# Patient Record
Sex: Male | Born: 1935
Health system: Southern US, Community
[De-identification: ages and names within clinical notes are randomized; demographics above are authoritative.]

## PROBLEM LIST (undated history)

## (undated) DIAGNOSIS — Z8719 Personal history of other diseases of the digestive system: Secondary | ICD-10-CM

## (undated) DIAGNOSIS — M359 Systemic involvement of connective tissue, unspecified: Secondary | ICD-10-CM

## (undated) DIAGNOSIS — R0600 Dyspnea, unspecified: Secondary | ICD-10-CM

## (undated) DIAGNOSIS — I6529 Occlusion and stenosis of unspecified carotid artery: Secondary | ICD-10-CM

## (undated) DIAGNOSIS — K219 Gastro-esophageal reflux disease without esophagitis: Secondary | ICD-10-CM

## (undated) DIAGNOSIS — F0390 Unspecified dementia without behavioral disturbance: Secondary | ICD-10-CM

## (undated) DIAGNOSIS — J449 Chronic obstructive pulmonary disease, unspecified: Secondary | ICD-10-CM

## (undated) DIAGNOSIS — C801 Malignant (primary) neoplasm, unspecified: Secondary | ICD-10-CM

## (undated) DIAGNOSIS — M539 Dorsopathy, unspecified: Secondary | ICD-10-CM

## (undated) DIAGNOSIS — H409 Unspecified glaucoma: Secondary | ICD-10-CM

## (undated) DIAGNOSIS — M1990 Unspecified osteoarthritis, unspecified site: Secondary | ICD-10-CM

## (undated) DIAGNOSIS — J45909 Unspecified asthma, uncomplicated: Secondary | ICD-10-CM

## (undated) DIAGNOSIS — I509 Heart failure, unspecified: Secondary | ICD-10-CM

## (undated) DIAGNOSIS — I10 Essential (primary) hypertension: Secondary | ICD-10-CM

## (undated) DIAGNOSIS — Z8619 Personal history of other infectious and parasitic diseases: Secondary | ICD-10-CM

## (undated) DIAGNOSIS — G629 Polyneuropathy, unspecified: Secondary | ICD-10-CM

## (undated) DIAGNOSIS — Z950 Presence of cardiac pacemaker: Secondary | ICD-10-CM

## (undated) DIAGNOSIS — I2510 Atherosclerotic heart disease of native coronary artery without angina pectoris: Secondary | ICD-10-CM

## (undated) HISTORY — PX: BACK SURGERY: SHX140

## (undated) HISTORY — PX: JOINT REPLACEMENT: SHX530

---

## 2004-11-03 DIAGNOSIS — Z950 Presence of cardiac pacemaker: Secondary | ICD-10-CM

## 2004-11-03 HISTORY — DX: Presence of cardiac pacemaker: Z95.0

## 2005-01-27 ENCOUNTER — Other Ambulatory Visit: Payer: Self-pay

## 2005-01-27 ENCOUNTER — Inpatient Hospital Stay: Payer: Self-pay | Admitting: Internal Medicine

## 2005-02-23 ENCOUNTER — Inpatient Hospital Stay: Payer: Self-pay | Admitting: Internal Medicine

## 2005-02-26 ENCOUNTER — Emergency Department: Payer: Self-pay | Admitting: Emergency Medicine

## 2005-05-31 ENCOUNTER — Other Ambulatory Visit: Payer: Self-pay

## 2005-05-31 ENCOUNTER — Emergency Department: Payer: Self-pay | Admitting: Emergency Medicine

## 2005-06-30 ENCOUNTER — Ambulatory Visit: Payer: Self-pay | Admitting: Internal Medicine

## 2005-07-13 ENCOUNTER — Other Ambulatory Visit: Payer: Self-pay

## 2005-07-13 ENCOUNTER — Emergency Department: Payer: Self-pay | Admitting: Internal Medicine

## 2005-07-14 ENCOUNTER — Ambulatory Visit: Payer: Self-pay | Admitting: Internal Medicine

## 2005-08-20 ENCOUNTER — Ambulatory Visit: Payer: Self-pay

## 2005-09-22 ENCOUNTER — Ambulatory Visit: Payer: Self-pay | Admitting: Gastroenterology

## 2006-05-10 ENCOUNTER — Emergency Department: Payer: Self-pay | Admitting: Emergency Medicine

## 2006-05-10 ENCOUNTER — Other Ambulatory Visit: Payer: Self-pay

## 2006-06-13 HISTORY — PX: SKIN DEBRIDEMENT: SHX5235

## 2007-07-12 ENCOUNTER — Ambulatory Visit: Payer: Self-pay | Admitting: Physician Assistant

## 2007-11-30 ENCOUNTER — Emergency Department: Payer: Self-pay | Admitting: Emergency Medicine

## 2007-11-30 ENCOUNTER — Other Ambulatory Visit: Payer: Self-pay

## 2008-03-16 ENCOUNTER — Ambulatory Visit: Payer: Self-pay | Admitting: Internal Medicine

## 2008-11-14 ENCOUNTER — Ambulatory Visit: Payer: Self-pay | Admitting: Internal Medicine

## 2009-05-17 ENCOUNTER — Ambulatory Visit: Payer: Self-pay | Admitting: Internal Medicine

## 2009-05-19 ENCOUNTER — Emergency Department: Payer: Self-pay | Admitting: Emergency Medicine

## 2009-11-23 ENCOUNTER — Emergency Department: Payer: Self-pay | Admitting: Emergency Medicine

## 2009-11-28 ENCOUNTER — Ambulatory Visit: Payer: Self-pay | Admitting: Cardiology

## 2009-11-29 ENCOUNTER — Ambulatory Visit: Payer: Self-pay | Admitting: Cardiology

## 2010-02-14 ENCOUNTER — Ambulatory Visit: Payer: Self-pay | Admitting: Otolaryngology

## 2010-02-16 ENCOUNTER — Emergency Department: Payer: Self-pay | Admitting: Emergency Medicine

## 2010-07-25 ENCOUNTER — Ambulatory Visit: Payer: Self-pay | Admitting: Otolaryngology

## 2010-07-29 ENCOUNTER — Ambulatory Visit: Payer: Self-pay | Admitting: Internal Medicine

## 2010-08-09 ENCOUNTER — Ambulatory Visit: Payer: Self-pay | Admitting: Unknown Physician Specialty

## 2010-08-13 ENCOUNTER — Ambulatory Visit: Payer: Self-pay | Admitting: Pain Medicine

## 2010-08-15 ENCOUNTER — Ambulatory Visit: Payer: Self-pay | Admitting: Pain Medicine

## 2010-08-29 ENCOUNTER — Ambulatory Visit: Payer: Self-pay | Admitting: Internal Medicine

## 2010-09-04 ENCOUNTER — Ambulatory Visit: Payer: Self-pay | Admitting: Pain Medicine

## 2010-10-12 ENCOUNTER — Observation Stay: Payer: Self-pay | Admitting: Internal Medicine

## 2010-11-03 DIAGNOSIS — Z8619 Personal history of other infectious and parasitic diseases: Secondary | ICD-10-CM

## 2010-11-03 HISTORY — DX: Personal history of other infectious and parasitic diseases: Z86.19

## 2011-11-04 ENCOUNTER — Emergency Department: Payer: Self-pay | Admitting: Emergency Medicine

## 2011-11-04 DIAGNOSIS — C801 Malignant (primary) neoplasm, unspecified: Secondary | ICD-10-CM

## 2011-11-04 HISTORY — DX: Malignant (primary) neoplasm, unspecified: C80.1

## 2011-11-04 LAB — COMPREHENSIVE METABOLIC PANEL
Albumin: 3.8 g/dL (ref 3.4–5.0)
Alkaline Phosphatase: 62 U/L (ref 50–136)
BUN: 12 mg/dL (ref 7–18)
Bilirubin,Total: 0.7 mg/dL (ref 0.2–1.0)
Calcium, Total: 8.7 mg/dL (ref 8.5–10.1)
Glucose: 110 mg/dL — ABNORMAL HIGH (ref 65–99)
Osmolality: 282 (ref 275–301)
SGPT (ALT): 22 U/L
Sodium: 141 mmol/L (ref 136–145)

## 2011-11-04 LAB — CBC
HCT: 36.7 % — ABNORMAL LOW (ref 40.0–52.0)
HGB: 12.7 g/dL — ABNORMAL LOW (ref 13.0–18.0)
MCHC: 34.5 g/dL (ref 32.0–36.0)
MCV: 95 fL (ref 80–100)
RBC: 3.86 10*6/uL — ABNORMAL LOW (ref 4.40–5.90)
RDW: 13.1 % (ref 11.5–14.5)

## 2013-05-02 ENCOUNTER — Ambulatory Visit: Payer: Self-pay | Admitting: Internal Medicine

## 2013-12-21 ENCOUNTER — Ambulatory Visit: Payer: Self-pay | Admitting: Internal Medicine

## 2014-01-12 ENCOUNTER — Ambulatory Visit: Payer: Self-pay | Admitting: Cardiology

## 2014-01-12 LAB — CK-MB: CK-MB: 1.6 ng/mL (ref 0.5–3.6)

## 2014-01-13 LAB — BASIC METABOLIC PANEL
Anion Gap: 3 — ABNORMAL LOW (ref 7–16)
BUN: 10 mg/dL (ref 7–18)
CHLORIDE: 109 mmol/L — AB (ref 98–107)
CO2: 28 mmol/L (ref 21–32)
Calcium, Total: 8.1 mg/dL — ABNORMAL LOW (ref 8.5–10.1)
Creatinine: 0.82 mg/dL (ref 0.60–1.30)
EGFR (Non-African Amer.): >60
GLUCOSE: 98 mg/dL (ref 65–99)
Osmolality: 278 (ref 275–301)
POTASSIUM: 3.8 mmol/L (ref 3.5–5.1)
Sodium: 140 mmol/L (ref 136–145)

## 2014-02-28 ENCOUNTER — Ambulatory Visit: Payer: Self-pay | Admitting: Internal Medicine

## 2014-03-16 ENCOUNTER — Observation Stay: Payer: Self-pay | Admitting: Internal Medicine

## 2014-03-16 LAB — BASIC METABOLIC PANEL
Anion Gap: 8 (ref 7–16)
BUN: 9 mg/dL (ref 7–18)
CALCIUM: 8.6 mg/dL (ref 8.5–10.1)
CHLORIDE: 103 mmol/L (ref 98–107)
Co2: 28 mmol/L (ref 21–32)
Creatinine: 0.97 mg/dL (ref 0.60–1.30)
Glucose: 109 mg/dL — ABNORMAL HIGH (ref 65–99)
Osmolality: 277 (ref 275–301)
Potassium: 3.7 mmol/L (ref 3.5–5.1)
SODIUM: 139 mmol/L (ref 136–145)

## 2014-03-16 LAB — CBC
HCT: 39.2 % — ABNORMAL LOW (ref 40.0–52.0)
HGB: 13.6 g/dL (ref 13.0–18.0)
MCH: 32.9 pg (ref 26.0–34.0)
MCHC: 34.6 g/dL (ref 32.0–36.0)
MCV: 95 fL (ref 80–100)
Platelet: 156 10*3/uL (ref 150–440)
RBC: 4.12 10*6/uL — ABNORMAL LOW (ref 4.40–5.90)
RDW: 13.7 % (ref 11.5–14.5)
WBC: 5.3 10*3/uL (ref 3.8–10.6)

## 2014-03-16 LAB — CK-MB
CK-MB: 2.8 ng/mL (ref 0.5–3.6)
CK-MB: 2.5 ng/mL (ref 0.5–3.6)
CK-MB: 2.7 ng/mL (ref 0.5–3.6)

## 2014-03-16 LAB — LIPID PANEL
Cholesterol: 180 mg/dL (ref 0–200)
HDL: 59 mg/dL (ref 40–60)
Ldl Cholesterol, Calc: 87 mg/dL (ref 0–100)
Triglycerides: 169 mg/dL (ref 0–200)
VLDL Cholesterol, Calc: 34 mg/dL (ref 5–40)

## 2014-03-16 LAB — PROTIME-INR
INR: 1
PROTHROMBIN TIME: 13 s (ref 11.5–14.7)

## 2014-03-16 LAB — TROPONIN I
Troponin-I: <0.02 ng/mL
Troponin-I: <0.02 ng/mL
Troponin-I: <0.02 ng/mL

## 2014-03-16 LAB — PRO B NATRIURETIC PEPTIDE: B-TYPE NATIURETIC PEPTID: 772 pg/mL — AB (ref 0–450)

## 2014-03-17 LAB — BASIC METABOLIC PANEL
Anion Gap: 4 — ABNORMAL LOW (ref 7–16)
BUN: 10 mg/dL (ref 7–18)
CO2: 29 mmol/L (ref 21–32)
Calcium, Total: 8.3 mg/dL — ABNORMAL LOW (ref 8.5–10.1)
Chloride: 109 mmol/L — ABNORMAL HIGH (ref 98–107)
Creatinine: 0.9 mg/dL (ref 0.60–1.30)
EGFR (Non-African Amer.): >60
Glucose: 101 mg/dL — ABNORMAL HIGH (ref 65–99)
OSMOLALITY: 282 (ref 275–301)
POTASSIUM: 3.8 mmol/L (ref 3.5–5.1)
Sodium: 142 mmol/L (ref 136–145)

## 2014-03-17 LAB — CBC WITH DIFFERENTIAL/PLATELET
BASOS ABS: 0.1 10*3/uL (ref 0.0–0.1)
Basophil %: 1.2 %
Eosinophil #: 0.2 10*3/uL (ref 0.0–0.7)
Eosinophil %: 3.2 %
HCT: 38.5 % — ABNORMAL LOW (ref 40.0–52.0)
HGB: 13.4 g/dL (ref 13.0–18.0)
Lymphocyte #: 0.9 10*3/uL — ABNORMAL LOW (ref 1.0–3.6)
Lymphocyte %: 18.3 %
MCH: 33 pg (ref 26.0–34.0)
MCHC: 34.7 g/dL (ref 32.0–36.0)
MCV: 95 fL (ref 80–100)
MONO ABS: 0.4 x10 3/mm (ref 0.2–1.0)
Monocyte %: 8.7 %
NEUTROS PCT: 68.6 %
Neutrophil #: 3.3 10*3/uL (ref 1.4–6.5)
Platelet: 153 10*3/uL (ref 150–440)
RBC: 4.05 10*6/uL — ABNORMAL LOW (ref 4.40–5.90)
RDW: 13.8 % (ref 11.5–14.5)
WBC: 4.9 10*3/uL (ref 3.8–10.6)

## 2014-03-17 LAB — MAGNESIUM: Magnesium: 2.5 mg/dL — ABNORMAL HIGH

## 2014-11-07 DIAGNOSIS — H5310 Unspecified subjective visual disturbances: Secondary | ICD-10-CM | POA: Diagnosis not present

## 2014-11-08 DIAGNOSIS — I10 Essential (primary) hypertension: Secondary | ICD-10-CM | POA: Diagnosis not present

## 2014-11-08 DIAGNOSIS — I25119 Atherosclerotic heart disease of native coronary artery with unspecified angina pectoris: Secondary | ICD-10-CM | POA: Diagnosis not present

## 2014-11-08 DIAGNOSIS — E782 Mixed hyperlipidemia: Secondary | ICD-10-CM | POA: Diagnosis not present

## 2014-11-08 DIAGNOSIS — I442 Atrioventricular block, complete: Secondary | ICD-10-CM | POA: Diagnosis not present

## 2014-11-09 ENCOUNTER — Ambulatory Visit: Payer: Self-pay | Admitting: Internal Medicine

## 2014-11-09 DIAGNOSIS — H539 Unspecified visual disturbance: Secondary | ICD-10-CM | POA: Diagnosis not present

## 2014-11-16 DIAGNOSIS — I25119 Atherosclerotic heart disease of native coronary artery with unspecified angina pectoris: Secondary | ICD-10-CM | POA: Diagnosis not present

## 2014-11-16 DIAGNOSIS — I10 Essential (primary) hypertension: Secondary | ICD-10-CM | POA: Diagnosis not present

## 2014-11-16 DIAGNOSIS — R109 Unspecified abdominal pain: Secondary | ICD-10-CM | POA: Diagnosis not present

## 2014-11-16 DIAGNOSIS — E780 Pure hypercholesterolemia: Secondary | ICD-10-CM | POA: Diagnosis not present

## 2014-12-04 DIAGNOSIS — R350 Frequency of micturition: Secondary | ICD-10-CM | POA: Diagnosis not present

## 2014-12-04 DIAGNOSIS — Z0001 Encounter for general adult medical examination with abnormal findings: Secondary | ICD-10-CM | POA: Diagnosis not present

## 2014-12-05 DIAGNOSIS — H53123 Transient visual loss, bilateral: Secondary | ICD-10-CM | POA: Diagnosis not present

## 2014-12-08 DIAGNOSIS — I10 Essential (primary) hypertension: Secondary | ICD-10-CM | POA: Diagnosis not present

## 2014-12-08 DIAGNOSIS — G453 Amaurosis fugax: Secondary | ICD-10-CM | POA: Diagnosis not present

## 2014-12-08 DIAGNOSIS — I442 Atrioventricular block, complete: Secondary | ICD-10-CM | POA: Diagnosis not present

## 2014-12-08 DIAGNOSIS — I25119 Atherosclerotic heart disease of native coronary artery with unspecified angina pectoris: Secondary | ICD-10-CM | POA: Diagnosis not present

## 2014-12-19 DIAGNOSIS — I6523 Occlusion and stenosis of bilateral carotid arteries: Secondary | ICD-10-CM | POA: Diagnosis not present

## 2014-12-19 DIAGNOSIS — E782 Mixed hyperlipidemia: Secondary | ICD-10-CM | POA: Diagnosis not present

## 2014-12-19 DIAGNOSIS — G453 Amaurosis fugax: Secondary | ICD-10-CM | POA: Diagnosis not present

## 2014-12-19 DIAGNOSIS — I2510 Atherosclerotic heart disease of native coronary artery without angina pectoris: Secondary | ICD-10-CM | POA: Diagnosis not present

## 2014-12-19 DIAGNOSIS — I10 Essential (primary) hypertension: Secondary | ICD-10-CM | POA: Diagnosis not present

## 2014-12-26 DIAGNOSIS — I2510 Atherosclerotic heart disease of native coronary artery without angina pectoris: Secondary | ICD-10-CM | POA: Diagnosis not present

## 2014-12-26 DIAGNOSIS — I499 Cardiac arrhythmia, unspecified: Secondary | ICD-10-CM | POA: Diagnosis not present

## 2014-12-26 DIAGNOSIS — I6529 Occlusion and stenosis of unspecified carotid artery: Secondary | ICD-10-CM | POA: Diagnosis not present

## 2014-12-26 DIAGNOSIS — I10 Essential (primary) hypertension: Secondary | ICD-10-CM | POA: Diagnosis not present

## 2015-01-16 DIAGNOSIS — R0602 Shortness of breath: Secondary | ICD-10-CM | POA: Diagnosis not present

## 2015-01-16 DIAGNOSIS — I2510 Atherosclerotic heart disease of native coronary artery without angina pectoris: Secondary | ICD-10-CM | POA: Diagnosis not present

## 2015-01-16 DIAGNOSIS — I442 Atrioventricular block, complete: Secondary | ICD-10-CM | POA: Diagnosis not present

## 2015-01-16 DIAGNOSIS — I10 Essential (primary) hypertension: Secondary | ICD-10-CM | POA: Diagnosis not present

## 2015-01-25 DIAGNOSIS — J4530 Mild persistent asthma, uncomplicated: Secondary | ICD-10-CM | POA: Diagnosis not present

## 2015-01-25 DIAGNOSIS — E6609 Other obesity due to excess calories: Secondary | ICD-10-CM | POA: Diagnosis not present

## 2015-01-25 DIAGNOSIS — R0602 Shortness of breath: Secondary | ICD-10-CM | POA: Diagnosis not present

## 2015-02-24 NOTE — Discharge Summary (Signed)
PATIENT NAME:  Darrell Jacobson, Darrell Jacobson MR#:  [id number] DATE OF BIRTH:  Nov 11, 1927  DATE OF ADMISSION:  01/12/2014 DATE OF DISCHARGE:  01/13/2014  PRIMARY CARE PHYSICIAN: Dr. Gilford Rile.   FINAL DIAGNOSES: 1.  Coronary artery disease.  2.  Hypertension.  3.  Hyperlipidemia.  4. Complete heart block, status post dual-chamber pacemaker.   DISCHARGE MEDICATIONS: Aspirin 81 mg daily, clopidogrel 75 mg daily, Bystolic 5 mg daily, chlorthalidone 25 mg daily, Tylenol Extra Strength 500 mg 2 tabs daily p.r.n., Singulair 10 mg daily p.r.n., doxazosin 8 mg at bedtime, omeprazole 20 mg daily p.r.n., lorazepam 0.5 mg 2 to 4 times daily p.r.n., latanoprost ophthalmic solution 1 drop each eye at bedtime, ProAir 2 puffs q.4 hours.   PROCEDURES: Percutaneous coronary intervention on 01/12/2014.   HISTORY OF PRESENT ILLNESS: Please see admission H and [place] COURSE: The patient underwent elective percutaneous coronary intervention. The patient received a 2.5 x 18 mm drug-eluting Xience EX stent in the first obtuse marginal branch. There was an excellent angiographic result. The patient has a known 60% to 70% stenosis in the mid left anterior descending coronary artery. Fractional flow reserve was performed, which was 0.89, deemed hemodynamically insignificant. The patient had an uncomplicated hospital course. On the morning of 01/13/2014, the patient was discharged home in stable condition.    ____________________________ Isaias Cowman, MD ap:dmm D: 01/13/2014 07:54:57 ET T: 01/13/2014 11:51:49 ET JOB#: [id number]  cc: Isaias Cowman, MD, <Dictator> Isaias Cowman MD ELECTRONICALLY SIGNED 01/24/2014 12:45

## 2015-02-24 NOTE — H&P (Signed)
PATIENT NAME:  Darrell Jacobson, Darrell Jacobson MR#:  [id number] DATE OF BIRTH:  17-Jan-1928  DATE OF ADMISSION:  03/16/2014  REFERRING PHYSICIAN: Gretchen Short. Beather Arbour, MD  PRIMARY CARE PHYSICIAN: John B. Sarina Ser, MD  PRIMARY CARDIOLOGIST: Corey Skains, MD  CHIEF COMPLAINT: Chest pain.   HISTORY OF PRESENT ILLNESS: This is an 81 year old male with known history of coronary artery disease, status post recent cardiac catheterization by Dr. Saralyn Pilar, where he was found to have 60% to 70% stenosis in the mid left anterior descending coronary artery, status post drug-eluting stent in March of this year, as well as history of hypertension, hyperlipidemia and complete heart block, status post dual-chamber pacemaker. The patient reports since his surgery he does not have any complaints of chest pain. Reports yesterday evening, he did develop chest pain, midsternal, pressure-like quality, reports it resembled the chest pain when he had his cardiac catheterization done, radiating to the left chest, accompanied by shortness of breath, dizziness and sweating. By the time the patient came to the ED, he reports his chest pain improved. Refused to take nitroglycerin as it gives him headache. He received Lovenox and aspirin and reports currently his chest pain has resolved. The patient's EKG showing paced rhythm. As well, his first troponin was negative. Hospitalists requested to admit the patient for further evaluation.   PAST MEDICAL HISTORY:  1. Hypertension.  2. Hyperlipidemia.  3. Osteoarthritis.  4. Hiatal hernia.  5. History of disk disease.  6. Carotid artery stenosis.  7. Obesity.  8. Heart block.  9. Glaucoma.  10. COPD.  11. Low back pain.   PAST SURGICAL HISTORY:  1. Laminectomy x3.  2. Double hernia repair.  3. Left knee replacement.  4. Cardiac pacemaker in 2006 for complete heart block.   HOME MEDICATIONS:  1. Aspirin 81 mg daily.  2. Plavix 75 mg daily.  3. Bystolic 5 mg daily.  4. Chlorthalidone  25 mg daily.  5. Tylenol Extra as needed.  6. Singulair 10 mg daily p.r.n.  7. Doxazosin 8 mg at bedtime.  8. Omeprazole 20 mg p.r.n.  9. Lorazepam 0.5 mg 2 to 4 times a day.  10. Latanoprost ophthalmic solution 1 drop in each eye at bedtime.  11. ProAir 2 puffs every 4 hours as needed.   ALLERGIES: THE PATIENT HAS MULTIPLE DRUG ALLERGIES, INCLUDING AMBIEN, AMLODIPINE, AZOPT, BENICAR, BRIMONIDINE, BUSPIRONE, CELEBREX, CIPRO, CODEINE, COZAAR AND CYMBALTA.   SOCIAL HISTORY: Lives at home. Quit smoking in 1986. No drug use. Drinks alcohol occasionally.   FAMILY HISTORY: Significant for diabetes. Brother had throat cancer.   REVIEW OF SYSTEMS:  CONSTITUTIONAL: Denies fever, chills, fatigue, weakness.  EYES: Denies blurry vision, double vision, inflammation.  ENT: Denies tinnitus, ear pain, hearing loss, epistaxis.  RESPIRATORY: Denies cough, wheezing, hemoptysis. Reports shortness of breath.  CARDIOVASCULAR: Reports chest pain. Denies any edema, palpitations, syncope.  GASTROINTESTINAL: Denies nausea, vomiting, diarrhea, abdominal pain, hematemesis.  GENITOURINARY: Denies dysuria, hematuria, renal colic.  ENDOCRINE: Denies polyuria or polydipsia, heat or cold intolerance.  HEMATOLOGY: Denies anemia, easy bruising or bleeding diathesis. INTEGUMENTARY: Denies acne, rash or skin lesion.  MUSCULOSKELETAL: Denies any swelling, gout, cramps, arthritis.  NEUROLOGIC: Denies CVA, TIA, vertigo, tremor.  PSYCHIATRIC: Denies anxiety, insomnia or depression.   PHYSICAL EXAMINATION:  VITAL SIGNS: Temperature 97.6, pulse 69, respiratory rate 18, blood pressure 185/84, saturating 98% on oxygen.  GENERAL: Well-nourished male who looks comfortable in bed, in no apparent distress.  HEENT: Head atraumatic, normocephalic. Pupils equally reactive to light. Pink conjunctivae.  Anicteric sclerae. Moist oral mucosa.  NECK: Supple. No thyromegaly. No JVD.  CHEST: Good air entry bilaterally. No wheezing, rales,  rhonchi.  CARDIOVASCULAR: S1, S2 heard. No rubs, murmurs or gallops.  ABDOMEN: Soft, nontender, nondistended. Bowel sounds present.  EXTREMITIES: No edema. No clubbing. No cyanosis.  PSYCHIATRIC: Appropriate affect. Awake, alert x3. Intact judgment and insight.  NEUROLOGIC: Cranial nerves grossly intact. Motor 5 out of 5. No focal deficits.  SKIN: Warm and dry. Normal skin turgor.  MUSCULOSKELETAL: No joint effusion or erythema.   PERTINENT LABORATORY DATA: Glucose 109. BNP 772. BUN 9, creatinine 0.97, sodium 139, potassium 3.7, chloride 103. Troponin less than 0.02. White blood cells 5.3, hemoglobin 13.6, hematocrit 39.2, platelets 156. INR is 1. EKG showing paced rhythm at 67 beats per minute.   ASSESSMENT AND PLAN:  1. Chest pain. Given the patient's significant history of coronary artery disease, he will be admitted for further workup. His EKG showing paced rhythm. First troponin is negative. Currently, chest pain-free. The patient was given aspirin and Lovenox in ED out of concern of acute coronary syndrome. The patient will be admitted to telemetry. Will continue to cycle his cardiac enzymes, follow the trend, and will consult [facility] to see if there is any further workup that is indicated at this point.  2. History of coronary artery disease. The patient will be followed by cardiology. Will continue him on aspirin, Plavix, Bystolic, and he is refusing to take any nitroglycerin currently.  3. Hypertension. Blood pressure is mildly elevated. Will resume him back on his home medication. Will add p.r.n. hydralazine.  4. History of complete heart block, status post pacemaker. Will monitor him on telemetry.  5. Hyperlipidemia. The patient does not appear to be on any statins. Will check lipid panel, and if needed, will start them.  6. Deep vein thrombosis prophylaxis. Subcutaneous heparin.  CODE STATUS: Full code.   TOTAL TIME SPENT ON ADMISSION AND PATIENT CARE: 50 minutes.    ____________________________ Albertine Patricia, MD dse:lb D: 03/16/2014 06:54:29 ET T: 03/16/2014 07:33:32 ET JOB#: [id number]  cc: Albertine Patricia, MD, <Dictator> Leilan Bochenek Graciela Husbands MD ELECTRONICALLY SIGNED 03/17/2014 2:18

## 2015-02-24 NOTE — Discharge Summary (Signed)
Dates of Admission and Diagnosis:  Date of Admission 16-Mar-2014   Date of Discharge 15-Jan-2014   Admitting Diagnosis Chest pain and shortness of breath   Final Diagnosis Chest pain, MI ruled out, CAD s/p stent in 01/2014, COPD   Discharge Diagnosis 1 Chest pain, MI ruled out, CAD s/p stent in 01/2014, COPD    Chief Complaint/History of Present Illness Elderly male presented to ED with acute SOB and left sided chest pain. h/o CAD, s/p stent in 01/2014 on Plavix. CXR was negative. Labs in ED were remarkable for elevated BNP over 700.   Allergies:  Morphine: Anaphylaxis, Headaches  Vioxx: Resp. Distress, Swelling, Other  Lisinopril: Resp. Distress  Metoprolol: Resp. Distress  Cozaar: Resp. Distress, Anxiety, Dizzy/Fainting  Benicar: Resp. Distress, Anxiety, Swelling  Other- Explain in Comments Line: SOB  Lasix: SOB  Indomethacin: Agitation, Anxiety  Brimonidine: Dizzy/Fainting  Codeine: Anxiety  Meclizine: N/V/Diarrhea  Ambien: Hallucinations  Prednisone: Agitation, Anxiety  Felodipine: Anxiety, Dizzy/Fainting, Swelling  Xalatan: Dizzy/Fainting  Penicillin: Swelling  Sulfamethoxazole/Trimethoprim: N/V/Diarrhea  Mobic: Other  Celebrex: Other  Dicyclomine: Unknown  Cipro: Unknown  Skelaxin: Unknown  Omnicef: Unknown  Cymbalta: Unknown  Azopt: Unknown  Darvocet - N: Unknown  Relafen: Unknown  Novocain: Unknown  Edecrin: Unknown  Buspirone: Unknown  Hydrochlorothiazide: Unknown  Amlodipine: Resp. Distress  Hydrocodone: N/V/Diarrhea, SOB  Naproxen: N/V/Diarrhea  Tramadol: N/V/Diarrhea  Diazepam: Other  Pertinent Past History:  Pertinent Past History CAD, s/p stent in 01/2014 COPD   Hospital Course:  Hospital Course Patient was admitted to telemetry. He received Lovenox. Bystolic, Plavix and Aspirin were continued. Patient c/o headache with nitrates. Serial cardiac enzymes were negative. He received Duoneb treatments for COPD. Patient was evaluated by Cardiology  and started on Ranexa. Unable to tolerate Nitrates. SOB and chest pain resolved.  Exam: Alert, oriented, NAD. Chest: clear. CVS: RRR, no tachycardia. Abdomen: soft, non-tender. Ext: no edema. Neuro: nonfocal.  MEDICATION CLARIFICATION: continue Doxazosin or Cardura . Start Ranexa 500 mg BID. (Unable to tolerate nitrates)   Condition on Discharge Stable   DISCHARGE INSTRUCTIONS HOME MEDS:  Medication Reconciliation: Patient's Home Medications at Discharge:     Medication Instructions  omeprazole 20 mg oral delayed release capsule  1 cap(s) PO once a day PRN   lorazepam 0.5 mg oral tablet  1/2 tab 3-4 times daily   latanoprost ophthalmic 0.005% ophthalmic solution  1 drop(s) to each affected eye once a day (at bedtime)   proair hfa cfc free 90 mcg/inh inhalation aerosol  2 puff(s) inhaled every 4 hours if need   aspirin enteric coated 81 mg oral delayed release tablet  1 tab(s) orally once a day   bystolic 5 mg oral tablet  0.5 tab(s) orally once a day and 1/2 tab  if needed   singulair 10 mg oral tablet  1 tab(s) orally once a day (in the evening), As Needed   vitamin d3 2000 intl units oral tablet  1 tab(s) orally once a day   multivitamin  1 tab(s) orally once a day   clopidogrel 75 mg oral tablet  1 tab(s) orally once a day (at bedtime)   ranolazine 500 mg oral tablet, extended release  1 tab(s) orally 2 times a day   chlorthalidone  12.5 milligram(s) orally once a day    PRESCRIPTIONS: ELECTRONICALLY SUBMITTED   Physician's Instructions:  Diet Low Sodium  Low Fat, Low Cholesterol   Activity Limitations As tolerated   Return to Work Not Applicable   Time frame for Follow  Up Appointment 1-2 weeks  [facility] cardiology   Time frame for Follow Up Appointment 1-2 weeks  Dr. Gilford Rile   Other Comments MEDICATION CLARIFICATION: continue Doxazosin or Cardura .   Start Ranexa 500 mg BID. (Unable to tolerate nitrates)   Electronic Signatures: Glendon Axe (MD)  (Signed 15-May-15  22:08)  Authored: ADMISSION DATE AND DIAGNOSIS, CHIEF COMPLAINT/HPI, Allergies, PERTINENT PAST HISTORY, HOSPITAL COURSE, DISCHARGE INSTRUCTIONS HOME MEDS, PATIENT INSTRUCTIONS   Last Updated: 15-May-15 22:08 by Glendon Axe (MD)

## 2015-02-24 NOTE — Consult Note (Signed)
PATIENT NAME:  Darrell Jacobson, Darrell Jacobson MR#:  [id number] DATE OF BIRTH:  Mar 31, 1928  DATE OF CONSULTATION:  03/16/2014.  CONSULTING PHYSICIAN:  Thelma Viana D. Prerana Strayer, MD  INDICATION:  Known coronary artery disease with vague chest pain, shortness of breath.  PRIMARY CARE PHYSICIAN:  Dr. Gilford Rile.   CARDIOLOGY:  Dr. Nehemiah Massed.  HISTORY OF PRESENT ILLNESS:  The patient is an 81 year old male with a history of coronary artery disease, status post recent catheter by Dr. Saralyn Pilar back in March, found to have to 60% to 70% stenosis in the left anterior descending artery, a history of stent placement early in the year with a stent to the diagonal, _first_______ by Dr. Saralyn Pilar. The patient has a known history of hypertension, hyperlipidemia, a complete heart block with permanent pacemaker. The patient reports that he started having symptoms of mild vertigo, upset stomach, shortness of breath and vague chest pain so he came to the Emergency Room. He was not sure that it was related to his heart, but because of shortness of breath, dizziness, and sweating, he came to the Emergency Room for evaluation. In the ER, he refused to take nitroglycerin. He says gives him a headache. His EKG is paced so it was unhelpful. He was placed on Lovenox, aspirin and admitted for further evaluation and care. Troponins were negative.   PAST MEDICAL HISTORY:  Hypertension, hyperlipidemia, osteoarthritis, disk disease, carotid artery stenosis, obesity, heart block, glaucoma, COPD, low back pain.   PAST SURGICAL HISTORY:  Laminectomy, double hernia repair, left knee replacement, permanent pacemaker placement.    MEDICATIONS:  Aspirin 81 mg a day, Plavix 75 a day, Bystolic 5 a  day, chlorthalidone 25 mg a day, Tylenol p.r.n., Singulair 10 mg daily p.r.n. doxazosin 8 mg at bedtime, omeprazole 20 a day p.r.n., lorazepam 0.5, 2 to 4 times a day, Ophthalmic drops p.r.n., ProAir 2 puffs q.4 p.r.n.   ALLERGIES:  MULTIPLE INCLUDING:  AMBIEN,  AMLODIPINE, AZOPT, BENICAR, BRIMONIDINE, BUSPIRONE, CELEBREX, CIPRO, CODEINE, COZAAR, CYMBALTA.   SOCIAL HISTORY:  Lives at home. Quit smoking in 1980s. No alcohol abuse.   FAMILY HISTORY:  Diabetes, throat cancer.  REVIEW OF SYSTEMS:  Denies blackout spells or syncope. No nausea or vomiting. No fever, no chills, no weight loss. No weight gain. No hemoptysis or hematemesis. No bright-red blood per rectum. No vision change. Denies sputum production or cough. He has only had some vague chest pain, shortness of breath, dizziness.   PHYSICAL EXAMINATION:  VITAL SIGNS:  Blood pressure was high 180/80, pulse of 70, respiratory rate of 16, afebrile.  HEENT:  Normocephalic, atraumatic. Pupils equal, round and reactive to light. NECK:  Supple. No significant JVD, bruits or adenopathy.  LUNGS:  Clear to auscultation and percussion. No significant wheeze, rhonchi or rales.  HEART:  Regular rate and rhythm. ABDOMEN:  Positive bowel sounds, without rebound, guarding or tenderness.  EXTREMITY:  Within normal limits. No significant cyanosis, clubbing or edema.  NEUROLOGIC:  Intact.  SKIN:  Normal.   LABORATORIES:  Glucose 109, BNP 772, BUN of 9, creatinine of 0.97, sodium 139, potassium of 3.7, chloride of 103. Troponin less than 0.02. White count 5.3, hemoglobin 13.6, hematocrit 39.2, platelet count of 156.   EKG:  70, paced rate.   ASSESSMENT:  Chest pain, chronic obstructive pulmonary disease, a history of coronary artery disease, recent status post stent, hypertension, obesity, complete heart block, hyperlipidemia.   PLAN: 1.  Recommend treatment to rule out for myocardial infarction. Place on telemetry. Continue anticoagulation with Lovenox. Also  recommend continue current medications with Bystolic and aspirin and Plavix.  2.  For chronic obstructive pulmonary disease, continue inhalers as necessary. 3.  Stop the Zosyn for benign prostatic hypertrophy.  4.  For gastroesophageal reflux disease,  continue omeprazole therapy.  5.  Eye drops for glaucoma. 6.  Since the patient recently had a cardiac catheterization, do not recommend definitive further evaluation. Would then consider either a functional study or medical therapy. I think at this point, I would recommend therapy with Imdur but since the patient cannot take nitrates, we will recommend Ranexa in the interim.  7.  For complete heart block. Continue current therapy with the pacemaker on telemetry. It did not appear that the pacemaker is malfunctioning. I do not recommend further therapy.  8.  For hyperlipidemia, again, continue statin therapy. I would maintain aspirin and Plavix for the stent. I do not believe there is any acute stent thrombosis at this point. We will try to treat the patient medically and evaluate for other causes of his recent symptoms.   ____________________________ Loran Senters Clayborn Bigness, MD ddc:jm D: 03/16/2014 15:01:15 ET T: 03/16/2014 17:16:23 ET JOB#: [id number]  cc: Trevaun Rendleman D. Clayborn Bigness, MD, <Dictator> Yolonda Kida MD ELECTRONICALLY SIGNED 04/24/2014 0:46

## 2015-03-06 DIAGNOSIS — G451 Carotid artery syndrome (hemispheric): Secondary | ICD-10-CM | POA: Diagnosis not present

## 2015-03-06 DIAGNOSIS — R531 Weakness: Secondary | ICD-10-CM | POA: Diagnosis not present

## 2015-03-07 ENCOUNTER — Other Ambulatory Visit: Payer: Self-pay | Admitting: Internal Medicine

## 2015-03-07 DIAGNOSIS — G451 Carotid artery syndrome (hemispheric): Secondary | ICD-10-CM

## 2015-03-09 ENCOUNTER — Other Ambulatory Visit: Payer: Self-pay

## 2015-03-09 NOTE — Patient Outreach (Signed)
[facility]) Care Management  03/09/2015  Darrell Jacobson 1928-06-11 [id number]   RN CM following up with patient on a call he made to [facility] 24 hour nurse line.  Patient stated he feels better today.  RN CM explained the services of [facility] to patient.  Patient states he did not need the services of [facility] and declined the services.  Patient agrees to have information sent to him about [facility].  RN CM will notify patient's primary doctor of patient's discussion to decline [facility] services.    Maury Dus, RN, Ishmael Holter, [place] Telephonic Care Coordinator [phone]

## 2015-03-12 ENCOUNTER — Ambulatory Visit
Admission: RE | Admit: 2015-03-12 | Discharge: 2015-03-12 | Disposition: A | Discharge status: Home / Self Care | Payer: Commercial Managed Care - HMO | Source: Ambulatory Visit | Attending: Internal Medicine | Admitting: Internal Medicine

## 2015-03-12 DIAGNOSIS — R42 Dizziness and giddiness: Secondary | ICD-10-CM | POA: Diagnosis not present

## 2015-03-12 DIAGNOSIS — R200 Anesthesia of skin: Secondary | ICD-10-CM | POA: Insufficient documentation

## 2015-03-12 DIAGNOSIS — M6281 Muscle weakness (generalized): Secondary | ICD-10-CM | POA: Insufficient documentation

## 2015-03-12 DIAGNOSIS — H538 Other visual disturbances: Secondary | ICD-10-CM | POA: Diagnosis not present

## 2015-03-12 DIAGNOSIS — G451 Carotid artery syndrome (hemispheric): Secondary | ICD-10-CM

## 2015-03-12 HISTORY — DX: Essential (primary) hypertension: I10

## 2015-03-12 HISTORY — DX: Systemic involvement of connective tissue, unspecified: M35.9

## 2015-03-12 HISTORY — DX: Heart failure, unspecified: I50.9

## 2015-03-12 HISTORY — DX: Unspecified asthma, uncomplicated: J45.909

## 2015-03-12 HISTORY — DX: Occlusion and stenosis of unspecified carotid artery: I65.29

## 2015-03-12 HISTORY — DX: Malignant (primary) neoplasm, unspecified: C80.1

## 2015-03-12 HISTORY — DX: Presence of cardiac pacemaker: Z95.0

## 2015-03-12 MED ORDER — IOHEXOL 300 MG/ML  SOLN
75.0000 mL | Freq: Once | INTRAMUSCULAR | Status: AC | PRN
Start: 2015-03-12 — End: 2015-03-12
  Administered 2015-03-12: 75 mL via INTRAVENOUS

## 2015-03-12 NOTE — Patient Outreach (Signed)
[facility]) Care Management  03/12/2015  Darrell Jacobson [birth year] [id number]   Received notification from Maury Dus, RN to close case due to patient refused [place] Management services.  [name]. [place] CM Assistant Phone: [phone] Fax: [phone]

## 2015-03-13 DIAGNOSIS — H4011X1 Primary open-angle glaucoma, mild stage: Secondary | ICD-10-CM | POA: Diagnosis not present

## 2015-03-16 DIAGNOSIS — J449 Chronic obstructive pulmonary disease, unspecified: Secondary | ICD-10-CM | POA: Diagnosis not present

## 2015-03-16 DIAGNOSIS — R197 Diarrhea, unspecified: Secondary | ICD-10-CM | POA: Diagnosis not present

## 2015-03-16 DIAGNOSIS — I10 Essential (primary) hypertension: Secondary | ICD-10-CM | POA: Diagnosis not present

## 2015-03-20 DIAGNOSIS — R197 Diarrhea, unspecified: Secondary | ICD-10-CM | POA: Diagnosis not present

## 2015-04-24 DIAGNOSIS — H4011X1 Primary open-angle glaucoma, mild stage: Secondary | ICD-10-CM | POA: Diagnosis not present

## 2015-05-01 DIAGNOSIS — I714 Abdominal aortic aneurysm, without rupture: Secondary | ICD-10-CM | POA: Diagnosis not present

## 2015-05-01 DIAGNOSIS — I6529 Occlusion and stenosis of unspecified carotid artery: Secondary | ICD-10-CM | POA: Diagnosis not present

## 2015-05-01 DIAGNOSIS — I2510 Atherosclerotic heart disease of native coronary artery without angina pectoris: Secondary | ICD-10-CM | POA: Diagnosis not present

## 2015-05-01 DIAGNOSIS — I499 Cardiac arrhythmia, unspecified: Secondary | ICD-10-CM | POA: Diagnosis not present

## 2015-05-01 DIAGNOSIS — I10 Essential (primary) hypertension: Secondary | ICD-10-CM | POA: Diagnosis not present

## 2015-05-04 DIAGNOSIS — W57XXXA Bitten or stung by nonvenomous insect and other nonvenomous arthropods, initial encounter: Secondary | ICD-10-CM | POA: Diagnosis not present

## 2015-05-04 DIAGNOSIS — S40861A Insect bite (nonvenomous) of right upper arm, initial encounter: Secondary | ICD-10-CM | POA: Diagnosis not present

## 2015-05-14 DIAGNOSIS — I442 Atrioventricular block, complete: Secondary | ICD-10-CM | POA: Diagnosis not present

## 2015-05-14 DIAGNOSIS — I10 Essential (primary) hypertension: Secondary | ICD-10-CM | POA: Diagnosis not present

## 2015-05-14 DIAGNOSIS — R42 Dizziness and giddiness: Secondary | ICD-10-CM | POA: Diagnosis not present

## 2015-05-14 DIAGNOSIS — I2510 Atherosclerotic heart disease of native coronary artery without angina pectoris: Secondary | ICD-10-CM | POA: Diagnosis not present

## 2015-05-23 DIAGNOSIS — M5032 Other cervical disc degeneration, mid-cervical region: Secondary | ICD-10-CM | POA: Diagnosis not present

## 2015-05-23 DIAGNOSIS — M542 Cervicalgia: Secondary | ICD-10-CM | POA: Diagnosis not present

## 2015-05-23 DIAGNOSIS — M5031 Other cervical disc degeneration,  high cervical region: Secondary | ICD-10-CM | POA: Diagnosis not present

## 2015-05-23 DIAGNOSIS — M546 Pain in thoracic spine: Secondary | ICD-10-CM | POA: Diagnosis not present

## 2015-05-28 DIAGNOSIS — J4530 Mild persistent asthma, uncomplicated: Secondary | ICD-10-CM | POA: Diagnosis not present

## 2015-05-28 DIAGNOSIS — R0602 Shortness of breath: Secondary | ICD-10-CM | POA: Diagnosis not present

## 2015-07-11 DIAGNOSIS — R5382 Chronic fatigue, unspecified: Secondary | ICD-10-CM | POA: Diagnosis not present

## 2015-07-17 DIAGNOSIS — I442 Atrioventricular block, complete: Secondary | ICD-10-CM | POA: Diagnosis not present

## 2015-07-31 DIAGNOSIS — E782 Mixed hyperlipidemia: Secondary | ICD-10-CM | POA: Diagnosis not present

## 2015-07-31 DIAGNOSIS — I442 Atrioventricular block, complete: Secondary | ICD-10-CM | POA: Diagnosis not present

## 2015-07-31 DIAGNOSIS — I25119 Atherosclerotic heart disease of native coronary artery with unspecified angina pectoris: Secondary | ICD-10-CM | POA: Diagnosis not present

## 2015-08-06 DIAGNOSIS — I442 Atrioventricular block, complete: Secondary | ICD-10-CM | POA: Diagnosis not present

## 2015-08-06 DIAGNOSIS — I25118 Atherosclerotic heart disease of native coronary artery with other forms of angina pectoris: Secondary | ICD-10-CM | POA: Diagnosis not present

## 2015-08-06 DIAGNOSIS — I25119 Atherosclerotic heart disease of native coronary artery with unspecified angina pectoris: Secondary | ICD-10-CM | POA: Diagnosis not present

## 2015-08-06 DIAGNOSIS — I10 Essential (primary) hypertension: Secondary | ICD-10-CM | POA: Diagnosis not present

## 2015-09-14 DIAGNOSIS — R531 Weakness: Secondary | ICD-10-CM | POA: Diagnosis not present

## 2015-09-21 DIAGNOSIS — D649 Anemia, unspecified: Secondary | ICD-10-CM | POA: Diagnosis not present

## 2015-10-08 DIAGNOSIS — R0602 Shortness of breath: Secondary | ICD-10-CM | POA: Diagnosis not present

## 2015-10-08 DIAGNOSIS — I429 Cardiomyopathy, unspecified: Secondary | ICD-10-CM | POA: Diagnosis not present

## 2015-10-08 DIAGNOSIS — R0609 Other forms of dyspnea: Secondary | ICD-10-CM | POA: Diagnosis not present

## 2015-10-08 DIAGNOSIS — J449 Chronic obstructive pulmonary disease, unspecified: Secondary | ICD-10-CM | POA: Diagnosis not present

## 2015-10-17 DIAGNOSIS — R0781 Pleurodynia: Secondary | ICD-10-CM | POA: Diagnosis not present

## 2015-10-18 ENCOUNTER — Emergency Department
Admission: EM | Admit: 2015-10-18 | Discharge: 2015-10-18 | Disposition: A | Discharge status: Home / Self Care | Payer: Commercial Managed Care - HMO | Attending: Emergency Medicine | Admitting: Emergency Medicine

## 2015-10-18 ENCOUNTER — Encounter: Payer: Self-pay | Admitting: Emergency Medicine

## 2015-10-18 ENCOUNTER — Emergency Department: Payer: Commercial Managed Care - HMO

## 2015-10-18 DIAGNOSIS — J441 Chronic obstructive pulmonary disease with (acute) exacerbation: Secondary | ICD-10-CM | POA: Diagnosis not present

## 2015-10-18 DIAGNOSIS — R42 Dizziness and giddiness: Secondary | ICD-10-CM | POA: Diagnosis not present

## 2015-10-18 DIAGNOSIS — Z950 Presence of cardiac pacemaker: Secondary | ICD-10-CM | POA: Diagnosis not present

## 2015-10-18 DIAGNOSIS — I10 Essential (primary) hypertension: Secondary | ICD-10-CM | POA: Diagnosis not present

## 2015-10-18 DIAGNOSIS — R252 Cramp and spasm: Secondary | ICD-10-CM | POA: Diagnosis not present

## 2015-10-18 DIAGNOSIS — Z87891 Personal history of nicotine dependence: Secondary | ICD-10-CM | POA: Insufficient documentation

## 2015-10-18 DIAGNOSIS — E782 Mixed hyperlipidemia: Secondary | ICD-10-CM | POA: Diagnosis not present

## 2015-10-18 DIAGNOSIS — R55 Syncope and collapse: Secondary | ICD-10-CM | POA: Insufficient documentation

## 2015-10-18 DIAGNOSIS — J449 Chronic obstructive pulmonary disease, unspecified: Secondary | ICD-10-CM | POA: Diagnosis not present

## 2015-10-18 DIAGNOSIS — I25118 Atherosclerotic heart disease of native coronary artery with other forms of angina pectoris: Secondary | ICD-10-CM | POA: Diagnosis not present

## 2015-10-18 DIAGNOSIS — R0781 Pleurodynia: Secondary | ICD-10-CM | POA: Diagnosis not present

## 2015-10-18 LAB — BASIC METABOLIC PANEL
Anion gap: 6 (ref 5–15)
BUN: 16 mg/dL (ref 6–20)
CALCIUM: 9.1 mg/dL (ref 8.9–10.3)
CO2: 28 mmol/L (ref 22–32)
Chloride: 106 mmol/L (ref 101–111)
Creatinine, Ser: 1.26 mg/dL — ABNORMAL HIGH (ref 0.61–1.24)
GFR calc non Af Amer: 51 mL/min — ABNORMAL LOW (ref >60)
GFR, EST AFRICAN AMERICAN: 59 mL/min — AB (ref >60)
GLUCOSE: 110 mg/dL — AB (ref 65–99)
Potassium: 4 mmol/L (ref 3.5–5.1)
Sodium: 140 mmol/L (ref 135–145)

## 2015-10-18 LAB — URINALYSIS COMPLETE WITH MICROSCOPIC (ARMC ONLY)
BACTERIA UA: NONE SEEN
Bilirubin Urine: NEGATIVE
Glucose, UA: NEGATIVE mg/dL
HGB URINE DIPSTICK: NEGATIVE
Ketones, ur: NEGATIVE mg/dL
Leukocytes, UA: NEGATIVE
NITRITE: NEGATIVE
PROTEIN: NEGATIVE mg/dL
SPECIFIC GRAVITY, URINE: 1.004 — AB (ref 1.005–1.030)
Squamous Epithelial / LPF: NONE SEEN
WBC, UA: NONE SEEN WBC/hpf (ref 0–5)
pH: 8 (ref 5.0–8.0)

## 2015-10-18 LAB — CBC
HEMATOCRIT: 40.5 % (ref 40.0–52.0)
Hemoglobin: 13.8 g/dL (ref 13.0–18.0)
MCH: 32.3 pg (ref 26.0–34.0)
MCHC: 34 g/dL (ref 32.0–36.0)
MCV: 94.8 fL (ref 80.0–100.0)
Platelets: 193 10*3/uL (ref 150–440)
RBC: 4.27 MIL/uL — AB (ref 4.40–5.90)
RDW: 13.5 % (ref 11.5–14.5)
WBC: 5.6 10*3/uL (ref 3.8–10.6)

## 2015-10-18 LAB — TROPONIN I

## 2015-10-18 MED ORDER — SODIUM CHLORIDE 0.9 % IV BOLUS (SEPSIS)
500.0000 mL | Freq: Once | INTRAVENOUS | Status: AC
  Administered 2015-10-18: 500 mL via INTRAVENOUS

## 2015-10-18 NOTE — ED Notes (Signed)
MD at bedside. 

## 2015-10-18 NOTE — ED Notes (Signed)
Pt states for the last couple of days he has had some weakness, states his BP has been elevated, states today he had near syncopal episode with some nausea, pt states he has a pacemaker, denies any cp

## 2015-10-18 NOTE — ED Notes (Signed)
Family at bedside. 

## 2015-10-18 NOTE — ED Provider Notes (Signed)
Time Seen: Approximately *----------------------------------------- 1:33 PM on 10/18/2015 -----------------------------------------    I have reviewed the triage notes  Chief Complaint: No chief complaint on file.   History of Present Illness: Darrell Jacobson is a 81 y.o. male who's had periodic dizziness now for an extensive period of time he describes as a lightheadedness. He states he was ambulating at his son's were placed today and felt extremely lightheaded and nearly passed out. He states he did not lose consciousness and apparently leaned over top of a he denies any focal weakness. He states he had some shortness of breath but no persistent cough or wheezing. He has history of COPD/asthma. Currently has a pacemaker. He denies any nausea or vomiting.  Past Medical History  Diagnosis Date  . Asthma     COPD with inhaler use  . Cancer [facility]) 2013    Basal Cell Skin CA resected from scalp.  . Collagen vascular disease ([place])   . Pacemaker 2006  . CHF (congestive heart failure) ([place])   . Hypertension   . Carotid stenosis     pateint states he has Left carotid blockage worse than Right.     There are no active problems to display for this patient.   History reviewed. No pertinent past surgical history.  History reviewed. No pertinent past surgical history.  No current outpatient prescriptions on file.  Allergies:  Prednisone and Codeine  Family History: History reviewed. No pertinent family history.  Social History: Social History  Substance Use Topics  . Smoking status: Former Research scientist (life sciences)  . Smokeless tobacco: None  . Alcohol Use: Yes     Review of Systems:   10 point review of systems was performed and was otherwise negative:  Constitutional: No fever Eyes: No visual disturbances ENT: No sore throat, ear pain Cardiac: No chest pain Respiratory: No shortness of breath, wheezing, or stridor Abdomen: No abdominal pain, no vomiting, No diarrhea Endocrine: No  weight loss, No night sweats Extremities: No peripheral edema, cyanosis Skin: No rashes, easy bruising Neurologic: No focal weakness, trouble with speech or swollowing Urologic: No dysuria, Hematuria, or urinary frequency   Physical Exam:  ED Triage Vitals  Enc Vitals Group     BP 10/18/15 1254 173/70 mmHg     Pulse Rate 10/18/15 1254 75     Resp 10/18/15 1254 18     Temp 10/18/15 1254 98.2 F (36.8 C)     Temp Source 10/18/15 1254 Oral     SpO2 10/18/15 1254 96 %     Weight 10/18/15 1254 193 lb (87.544 kg)     Height 10/18/15 1254 5\' 8"  (1.727 m)     Head Cir --      Peak Flow --      Pain Score 10/18/15 1303 10     Pain Loc --      Pain Edu? --      Excl. in [place]? --     General: Awake , Alert , and Oriented times 3; GCS 15 Head: Normal cephalic , atraumatic Eyes: Pupils equal , round, reactive to light Nose/Throat: No nasal drainage, patent upper airway without erythema or exudate.  Neck: Supple, Full range of motion, No anterior adenopathy or palpable thyroid masses Lungs: Clear to ascultation without wheezes , rhonchi, or rales Heart: Regular rate, regular rhythm without murmurs , gallops , or rubs Abdomen: Soft, non tender without rebound, guarding , or rigidity; bowel sounds positive and symmetric in all 4 quadrants. No organomegaly .  Extremities: 2 plus symmetric pulses. No edema, clubbing or cyanosis Neurologic: normal ambulation, Motor symmetric without deficits, sensory intact Skin: warm, dry, no rashes   Labs:   All laboratory work was reviewed including any pertinent negatives or positives listed below:  Labs Reviewed  [place] [facility])  TROPONIN I  CBG MONITORING, ED    EKG: * ED ECG REPORT I, Daymon Larsen, the attending physician, personally viewed and interpreted this ECG.  Date: 10/18/2015 EKG Time: 1308 Rate: 61 Rhythm: Electronic ventricular pacemaker QRS Axis:  normal Intervals: normal ST/T Wave abnormalities: normal Conduction Disutrbances: none Narrative Interpretation: unremarkable    Radiology:   Narrative:    CLINICAL DATA: Near syncope, dizziness.  EXAM: CHEST 2 VIEW  COMPARISON: Mar 16, 2014.  FINDINGS: Stable cardiomediastinal silhouette. No pneumothorax or pleural effusion is noted. Left-sided pacemaker is unchanged in position. Stable scarring is noted in both lung bases. Bony thorax is unremarkable. No acute pulmonary disease is noted.  IMPRESSION: No active cardiopulmonary disease.       I personally reviewed the radiologic studies     ED Course: Patient's stay was uneventful and he was given some IV fluids. It appears that he had a near syncopal episode without any actual loss of consciousness and I felt given that he has a pacemaker this was unlikely to be some significant cardiac arrhythmia. Patient's blood pressure, vital signs, objective studies appear to be within normal limits for him and all parties agree on outpatient management with follow-up with his cardiologist.   Assessment:  Near syncope      Plan:  Outpatient management Patient was advised to return immediately if condition worsens. Patient was advised to follow up with their primary care physician or other specialized physicians involved in their outpatient care             Daymon Larsen, MD 10/18/15 1701

## 2015-10-18 NOTE — ED Notes (Signed)
Pt here complaining of chronic weakness and dizziness.  Pt reports "close to going out" earlier today.

## 2015-10-30 DIAGNOSIS — J189 Pneumonia, unspecified organism: Secondary | ICD-10-CM | POA: Diagnosis not present

## 2015-10-30 DIAGNOSIS — R05 Cough: Secondary | ICD-10-CM | POA: Diagnosis not present

## 2015-11-02 DIAGNOSIS — R002 Palpitations: Secondary | ICD-10-CM | POA: Diagnosis not present

## 2015-11-02 DIAGNOSIS — I442 Atrioventricular block, complete: Secondary | ICD-10-CM | POA: Diagnosis not present

## 2015-11-02 DIAGNOSIS — I2510 Atherosclerotic heart disease of native coronary artery without angina pectoris: Secondary | ICD-10-CM | POA: Diagnosis not present

## 2015-11-02 DIAGNOSIS — R0602 Shortness of breath: Secondary | ICD-10-CM | POA: Diagnosis not present

## 2016-07-11 HISTORY — PX: SKIN GRAFT: SHX250

## 2016-12-17 NOTE — Discharge Instructions (Signed)
Cataract Surgery, Care After °Refer to this sheet in the next few weeks. These instructions provide you with information about caring for yourself after your procedure. Your health care provider may also give you more specific instructions. Your treatment has been planned according to current medical practices, but problems sometimes occur. Call your health care provider if you have any problems or questions after your procedure. °What can I expect after the procedure? °After the procedure, it is common to have: °· Itching. °· Discomfort. °· Fluid discharge. °· Sensitivity to light and to touch. °· Bruising. °Follow these instructions at home: °Eye Care  °· Check your eye every day for signs of infection. Watch for: °¨ Redness, swelling, or pain. °¨ Fluid, blood, or pus. °¨ Warmth. °¨ Bad smell. °Activity  °· Avoid strenuous activities, such as playing contact sports, for as long as told by your health care provider. °· Do not drive or operate heavy machinery until your health care provider approves. °· Do not bend or lift heavy objects . Bending increases pressure in the eye. You can walk, climb stairs, and do light household chores. °· Ask your health care provider when you can return to work. If you work in a dusty environment, you may be advised to wear protective eyewear for a period of time. °General instructions  °· Take or apply over-the-counter and prescription medicines only as told by your health care provider. This includes eye drops. °· Do not touch or rub your eyes. °· If you were given a protective shield, wear it as told by your health care provider. If you were not given a protective shield, wear sunglasses as told by your health care provider to protect your eyes. °· Keep the area around your eye clean and dry. Avoid swimming or allowing water to hit you directly in the face while showering until told by your health care provider. Keep soap and shampoo out of your eyes. °· Do not put a contact lens  into the affected eye or eyes until your health care provider approves. °· Keep all follow-up visits as told by your health care provider. This is important. °Contact a health care provider if: ° °· You have increased bruising around your eye. °· You have pain that is not helped with medicine. °· You have a fever. °· You have redness, swelling, or pain in your eye. °· You have fluid, blood, or pus coming from your incision. °· Your vision gets worse. °Get help right away if: °· You have sudden vision loss. °This information is not intended to replace advice given to you by your health care provider. Make sure you discuss any questions you have with your health care provider. °Document Released: 05/09/2005 Document Revised: 02/28/2016 Document Reviewed: 08/30/2015 °Elsevier Interactive Patient Education © 2017 [employer] ° ° ° ° °General Anesthesia, Adult, Care After °These instructions provide you with information about caring for yourself after your procedure. Your health care provider may also give you more specific instructions. Your treatment has been planned according to current medical practices, but problems sometimes occur. Call your health care provider if you have any problems or questions after your procedure. °What can I expect after the procedure? °After the procedure, it is common to have: °· Vomiting. °· A sore throat. °· Mental slowness. °It is common to feel: °· Nauseous. °· Cold or shivery. °· Sleepy. °· Tired. °· Sore or achy, even in parts of your body where you did not have surgery. °Follow these instructions at   home: °For at least 24 hours after the procedure:  °· Do not: °¨ Participate in activities where you could fall or become injured. °¨ Drive. °¨ Use heavy machinery. °¨ Drink alcohol. °¨ Take sleeping pills or medicines that cause drowsiness. °¨ Make important decisions or sign legal documents. °¨ Take care of children on your own. °· Rest. °Eating and drinking  °· If you vomit, drink  water, juice, or soup when you can drink without vomiting. °· Drink enough fluid to keep your urine clear or pale yellow. °· Make sure you have little or no nausea before eating solid foods. °· Follow the diet recommended by your health care provider. °General instructions  °· Have a responsible adult stay with you until you are awake and alert. °· Return to your normal activities as told by your health care provider. Ask your health care provider what activities are safe for you. °· Take over-the-counter and prescription medicines only as told by your health care provider. °· If you smoke, do not smoke without supervision. °· Keep all follow-up visits as told by your health care provider. This is important. °Contact a health care provider if: °· You continue to have nausea or vomiting at home, and medicines are not helpful. °· You cannot drink fluids or start eating again. °· You cannot urinate after 8-12 hours. °· You develop a skin rash. °· You have fever. °· You have increasing redness at the site of your procedure. °Get help right away if: °· You have difficulty breathing. °· You have chest pain. °· You have unexpected bleeding. °· You feel that you are having a life-threatening or urgent problem. °This information is not intended to replace advice given to you by your health care provider. Make sure you discuss any questions you have with your health care provider. °Document Released: 01/26/2001 Document Revised: 03/24/2016 Document Reviewed: 10/04/2015 °Elsevier Interactive Patient Education © 2017 [employer] ° °

## 2016-12-19 ENCOUNTER — Encounter: Payer: Self-pay | Admitting: *Deleted

## 2016-12-24 ENCOUNTER — Encounter
Admission: RE | Disposition: A | Discharge status: Home / Self Care | Payer: Self-pay | Source: Ambulatory Visit | Attending: Ophthalmology

## 2016-12-24 ENCOUNTER — Ambulatory Visit: Payer: Medicare Other | Admitting: Anesthesiology

## 2016-12-24 ENCOUNTER — Ambulatory Visit
Admission: RE | Admit: 2016-12-24 | Discharge: 2016-12-24 | Disposition: A | Discharge status: Home / Self Care | Payer: Medicare Other | Source: Ambulatory Visit | Attending: Ophthalmology | Admitting: Ophthalmology

## 2016-12-24 DIAGNOSIS — H5703 Miosis: Secondary | ICD-10-CM | POA: Diagnosis not present

## 2016-12-24 DIAGNOSIS — Z79899 Other long term (current) drug therapy: Secondary | ICD-10-CM | POA: Diagnosis not present

## 2016-12-24 DIAGNOSIS — H2511 Age-related nuclear cataract, right eye: Secondary | ICD-10-CM | POA: Diagnosis present

## 2016-12-24 DIAGNOSIS — I739 Peripheral vascular disease, unspecified: Secondary | ICD-10-CM | POA: Insufficient documentation

## 2016-12-24 DIAGNOSIS — Z87891 Personal history of nicotine dependence: Secondary | ICD-10-CM | POA: Diagnosis not present

## 2016-12-24 DIAGNOSIS — Z950 Presence of cardiac pacemaker: Secondary | ICD-10-CM | POA: Diagnosis not present

## 2016-12-24 DIAGNOSIS — J449 Chronic obstructive pulmonary disease, unspecified: Secondary | ICD-10-CM | POA: Diagnosis not present

## 2016-12-24 DIAGNOSIS — I2510 Atherosclerotic heart disease of native coronary artery without angina pectoris: Secondary | ICD-10-CM | POA: Diagnosis not present

## 2016-12-24 DIAGNOSIS — I509 Heart failure, unspecified: Secondary | ICD-10-CM | POA: Insufficient documentation

## 2016-12-24 DIAGNOSIS — I110 Hypertensive heart disease with heart failure: Secondary | ICD-10-CM | POA: Diagnosis not present

## 2016-12-24 DIAGNOSIS — K219 Gastro-esophageal reflux disease without esophagitis: Secondary | ICD-10-CM | POA: Insufficient documentation

## 2016-12-24 HISTORY — DX: Polyneuropathy, unspecified: G62.9

## 2016-12-24 HISTORY — DX: Atherosclerotic heart disease of native coronary artery without angina pectoris: I25.10

## 2016-12-24 HISTORY — DX: Unspecified glaucoma: H40.9

## 2016-12-24 HISTORY — DX: Dorsopathy, unspecified: M53.9

## 2016-12-24 HISTORY — DX: Personal history of other infectious and parasitic diseases: Z86.19

## 2016-12-24 HISTORY — DX: Unspecified osteoarthritis, unspecified site: M19.90

## 2016-12-24 HISTORY — DX: Dyspnea, unspecified: R06.00

## 2016-12-24 HISTORY — DX: Personal history of other diseases of the digestive system: Z87.19

## 2016-12-24 HISTORY — DX: Gastro-esophageal reflux disease without esophagitis: K21.9

## 2016-12-24 HISTORY — PX: CATARACT EXTRACTION W/PHACO: SHX586

## 2016-12-24 HISTORY — DX: Chronic obstructive pulmonary disease, unspecified: J44.9

## 2016-12-24 SURGERY — PHACOEMULSIFICATION, CATARACT, WITH IOL INSERTION
Anesthesia: Monitor Anesthesia Care | Site: Eye | Laterality: Right | Wound class: Clean

## 2016-12-24 MED ORDER — LIDOCAINE HCL (PF) 2 % IJ SOLN
INTRAOCULAR | Status: DC | PRN
  Administered 2016-12-24: 1 mL via INTRAOCULAR

## 2016-12-24 MED ORDER — EPINEPHRINE PF 1 MG/ML IJ SOLN
INTRAMUSCULAR | Status: DC | PRN
  Administered 2016-12-24: 78 mL via OPHTHALMIC

## 2016-12-24 MED ORDER — FENTANYL CITRATE (PF) 100 MCG/2ML IJ SOLN
INTRAMUSCULAR | Status: DC | PRN
  Administered 2016-12-24: 50 ug via INTRAVENOUS

## 2016-12-24 MED ORDER — NA HYALUR & NA CHOND-NA HYALUR 0.4-0.35 ML IO KIT
PACK | INTRAOCULAR | Status: DC | PRN
  Administered 2016-12-24: 1 mL via INTRAOCULAR

## 2016-12-24 MED ORDER — MOXIFLOXACIN HCL 0.5 % OP SOLN
OPHTHALMIC | Status: DC | PRN
  Administered 2016-12-24: .1 mL via OPHTHALMIC

## 2016-12-24 MED ORDER — MOXIFLOXACIN HCL 0.5 % OP SOLN
1.0000 [drp] | OPHTHALMIC | Status: DC | PRN
  Administered 2016-12-24 (×3): 1 [drp] via OPHTHALMIC

## 2016-12-24 MED ORDER — ARMC OPHTHALMIC DILATING DROPS
1.0000 "application " | OPHTHALMIC | Status: DC | PRN
  Administered 2016-12-24 (×3): 1 via OPHTHALMIC

## 2016-12-24 SURGICAL SUPPLY — 25 items
CANNULA ANT/CHMB 27GA (MISCELLANEOUS) ×3 IMPLANT
CARTRIDGE ABBOTT (MISCELLANEOUS) IMPLANT
GLOVE SURG LX 7.5 STRW (GLOVE) ×2
GLOVE SURG LX STRL 7.5 STRW (GLOVE) ×1 IMPLANT
GLOVE SURG TRIUMPH 8.0 PF LTX (GLOVE) ×3 IMPLANT
GOWN STRL REUS W/ TWL LRG LVL3 (GOWN DISPOSABLE) ×2 IMPLANT
GOWN STRL REUS W/TWL LRG LVL3 (GOWN DISPOSABLE) ×4
LENS IOL TECNIS ITEC 18.5 (Intraocular Lens) ×3 IMPLANT
MARKER SKIN DUAL TIP RULER LAB (MISCELLANEOUS) ×3 IMPLANT
NDL RETROBULBAR .5 NSTRL (NEEDLE) IMPLANT
NEEDLE FILTER BLUNT 18X 1/2SAF (NEEDLE) ×2
NEEDLE FILTER BLUNT 18X1 1/2 (NEEDLE) ×1 IMPLANT
PACK CATARACT BRASINGTON (MISCELLANEOUS) ×3 IMPLANT
PACK EYE AFTER SURG (MISCELLANEOUS) ×3 IMPLANT
PACK OPTHALMIC (MISCELLANEOUS) ×3 IMPLANT
RING MALYGIN 7.0 (MISCELLANEOUS) ×3 IMPLANT
SUT ETHILON 10-0 CS-B-6CS-B-6 (SUTURE)
SUT VICRYL  9 0 (SUTURE)
SUT VICRYL 9 0 (SUTURE) IMPLANT
SUTURE EHLN 10-0 CS-B-6CS-B-6 (SUTURE) IMPLANT
SYR 3ML LL SCALE MARK (SYRINGE) ×3 IMPLANT
SYR 5ML LL (SYRINGE) ×3 IMPLANT
SYR TB 1ML LUER SLIP (SYRINGE) ×3 IMPLANT
WATER STERILE IRR 250ML POUR (IV SOLUTION) ×3 IMPLANT
WIPE NON LINTING 3.25X3.25 (MISCELLANEOUS) ×3 IMPLANT

## 2016-12-24 NOTE — Anesthesia Procedure Notes (Signed)
Procedure Name: MAC Date/Time: 12/24/2016 9:58 AM Performed by: Janna Arch Pre-anesthesia Checklist: Patient identified, Emergency Drugs available, Suction available and Patient being monitored Patient Re-evaluated:Patient Re-evaluated prior to inductionOxygen Delivery Method: Nasal cannula

## 2016-12-24 NOTE — H&P (Signed)
The History and Physical notes are on paper, have been signed, and are to be scanned. The patient remains stable and unchanged from the H&P.   Previous H&P reviewed, patient examined, and there are no changes.  Darrell Jacobson 12/24/2016 9:10 AM

## 2016-12-24 NOTE — Anesthesia Postprocedure Evaluation (Signed)
Anesthesia Post Note  Patient: Darrell Jacobson  Procedure(s) Performed: Procedure(s) (LRB): CATARACT EXTRACTION PHACO AND INTRAOCULAR LENS PLACEMENT (IOC)  Right  complicated (Right)  Patient location during evaluation: PACU Anesthesia Type: MAC Level of consciousness: awake Pain management: pain level controlled Vital Signs Assessment: post-procedure vital signs reviewed and stable Respiratory status: spontaneous breathing Cardiovascular status: blood pressure returned to baseline Postop Assessment: no headache Anesthetic complications: no    Jaci Standard, III,  Santosh Petter D

## 2016-12-24 NOTE — Transfer of Care (Signed)
Immediate Anesthesia Transfer of Care Note  Patient: Darrell Jacobson  Procedure(s) Performed: Procedure(s) with comments: CATARACT EXTRACTION PHACO AND INTRAOCULAR LENS PLACEMENT (IOC)  Right  complicated (Right) - Malyugin  Patient Location: PACU  Anesthesia Type: MAC  Level of Consciousness: awake, alert  and patient cooperative  Airway and Oxygen Therapy: Patient Spontanous Breathing and Patient connected to supplemental oxygen  Post-op Assessment: Post-op Vital signs reviewed, Patient's Cardiovascular Status Stable, Respiratory Function Stable, Patent Airway and No signs of Nausea or vomiting  Post-op Vital Signs: Reviewed and stable  Complications: No apparent anesthesia complications

## 2016-12-24 NOTE — Anesthesia Preprocedure Evaluation (Signed)
Anesthesia Evaluation  Patient identified by MRN, date of birth, ID band Patient awake    Reviewed: Allergy & Precautions, H&P , NPO status , Patient's Chart, lab work & pertinent test results  Airway Mallampati: II  TM Distance: >3 FB Neck ROM: full    Dental no notable dental hx.    Pulmonary asthma , COPD, former smoker,    Pulmonary exam normal        Cardiovascular hypertension, + CAD, + Peripheral Vascular Disease and +CHF  Normal cardiovascular exam+ pacemaker      Neuro/Psych negative neurological ROS     GI/Hepatic Neg liver ROS, GERD  Medicated,  Endo/Other  negative endocrine ROS  Renal/GU negative Renal ROS  negative genitourinary   Musculoskeletal   Abdominal   Peds  Hematology   Anesthesia Other Findings   Reproductive/Obstetrics                             Anesthesia Physical Anesthesia Plan  ASA: III  Anesthesia Plan: MAC   Post-op Pain Management:    Induction:   Airway Management Planned:   Additional Equipment:   Intra-op Plan:   Post-operative Plan:   Informed Consent: I have reviewed the patients History and Physical, chart, labs and discussed the procedure including the risks, benefits and alternatives for the proposed anesthesia with the patient or authorized representative who has indicated his/her understanding and acceptance.     Plan Discussed with:   Anesthesia Plan Comments:         Anesthesia Quick Evaluation

## 2016-12-24 NOTE — Op Note (Signed)
OPERATIVE NOTE  Darrell Jacobson [id number] 12/24/2016   PREOPERATIVE DIAGNOSIS:    Nuclear Sclerotic Cataract Right eye with miotic pupil.        H25.11  POSTOPERATIVE DIAGNOSIS: Nuclear Sclerotic Cataract Right eye with miotic pupil.          PROCEDURE:  Phacoemusification with posterior chamber intraocular lens placement of the right eye which required pupil stretching with the Malyugin pupil expansion device.  LENS:   Implant Name Type Inv. Item Serial No. Manufacturer Lot No. LRB No. Used  LENS IOL DIOP 18.5 - [id number] Intraocular Lens LENS IOL DIOP 18.5 [id number]   Right 1       ULTRASOUND TIME: 13 % of 1 minutes 3 seconds, CDE 8.4  SURGEON:  Wyonia Hough, MD   ANESTHESIA:  Topical with tetracaine drops and 2% Xylocaine jelly, augmented with 1% preservative-free intracameral lidocaine.   COMPLICATIONS:  None.   DESCRIPTION OF PROCEDURE:  The patient was identified in the holding room and transported to the operating room and placed in the supine position under the operating microscope. Theright eye was identified as the operative eye and it was prepped and draped in the usual sterile ophthalmic fashion.   A 1 millimeter clear-corneal paracentesis was made at the 12:00 position.  0.5 ml of preservative-free 1% lidocaine was injected into the anterior chamber. The anterior chamber was filled with Viscoat viscoelastic.  A 2.4 millimeter keratome was used to make a near-clear corneal incision at the 9:00 position. A Malyugin pupil expander was then placed through the main incision and into the anterior chamber of the eye.  The edge of the iris was secured on the lip of the pupil expander and it was released, thereby expanding the pupil to approximately 8 millimeters for completion of the cataract surgery.  Additional Viscoat was placed in the anterior chamber.  A cystotome and capsulorrhexis forceps were used to make a curvilinear capsulorrhexis.   Balanced salt  solution was used to hydrodissect and hydrodelineate the lens nucleus.   Phacoemulsification was used in stop and chop fashion to remove the lens, nucleus and epinucleus.  The remaining cortex was aspirated using the irrigation aspiration handpiece.  Additional Provisc was placed into the eye to distend the capsular bag for lens placement.  A lens was then injected into the capsular bag.  The pupil expanding ring was removed using a Kuglen hook and insertion device. The remaining viscoelastic was aspirated from the capsular bag and the anterior chamber.  The anterior chamber was filled with balanced salt solution to inflate to a physiologic pressure.  Wounds were hydrated with balanced salt solution.  The anterior chamber was inflated to a physiologic pressure with balanced salt solution.  No wound leaks were noted.Vigamox 0.2 ml of a 1mg  per ml solution was injected into the anterior chamber for a dose of 0.2 mg of intracameral antibiotic at the completion of the case. Timolol and Brimonidine drops were applied to the eye.  The patient was taken to the recovery room in stable condition without complications of anesthesia or surgery.  Darrell Jacobson 12/24/2016, 10:17 AM

## 2016-12-25 ENCOUNTER — Encounter: Payer: Self-pay | Admitting: Ophthalmology

## 2017-11-05 DIAGNOSIS — I5022 Chronic systolic (congestive) heart failure: Secondary | ICD-10-CM | POA: Diagnosis not present

## 2017-11-05 DIAGNOSIS — I10 Essential (primary) hypertension: Secondary | ICD-10-CM | POA: Diagnosis not present

## 2017-11-05 DIAGNOSIS — I2510 Atherosclerotic heart disease of native coronary artery without angina pectoris: Secondary | ICD-10-CM | POA: Diagnosis not present

## 2017-11-05 DIAGNOSIS — I6523 Occlusion and stenosis of bilateral carotid arteries: Secondary | ICD-10-CM | POA: Diagnosis not present

## 2017-11-05 DIAGNOSIS — R079 Chest pain, unspecified: Secondary | ICD-10-CM | POA: Diagnosis not present

## 2017-12-07 DIAGNOSIS — J449 Chronic obstructive pulmonary disease, unspecified: Secondary | ICD-10-CM | POA: Diagnosis not present

## 2017-12-07 DIAGNOSIS — I5022 Chronic systolic (congestive) heart failure: Secondary | ICD-10-CM | POA: Diagnosis not present

## 2017-12-07 DIAGNOSIS — R0602 Shortness of breath: Secondary | ICD-10-CM | POA: Diagnosis not present

## 2017-12-14 DIAGNOSIS — J40 Bronchitis, not specified as acute or chronic: Secondary | ICD-10-CM | POA: Diagnosis not present

## 2017-12-14 DIAGNOSIS — J438 Other emphysema: Secondary | ICD-10-CM | POA: Diagnosis not present

## 2017-12-18 DIAGNOSIS — I2510 Atherosclerotic heart disease of native coronary artery without angina pectoris: Secondary | ICD-10-CM | POA: Diagnosis not present

## 2017-12-24 DIAGNOSIS — C44311 Basal cell carcinoma of skin of nose: Secondary | ICD-10-CM | POA: Diagnosis not present

## 2017-12-25 DIAGNOSIS — I2510 Atherosclerotic heart disease of native coronary artery without angina pectoris: Secondary | ICD-10-CM | POA: Diagnosis not present

## 2017-12-25 DIAGNOSIS — J438 Other emphysema: Secondary | ICD-10-CM | POA: Diagnosis not present

## 2017-12-25 DIAGNOSIS — Z0001 Encounter for general adult medical examination with abnormal findings: Secondary | ICD-10-CM | POA: Diagnosis not present

## 2017-12-25 DIAGNOSIS — S8011XA Contusion of right lower leg, initial encounter: Secondary | ICD-10-CM | POA: Diagnosis not present

## 2017-12-25 DIAGNOSIS — E782 Mixed hyperlipidemia: Secondary | ICD-10-CM | POA: Diagnosis not present

## 2017-12-25 DIAGNOSIS — I10 Essential (primary) hypertension: Secondary | ICD-10-CM | POA: Diagnosis not present

## 2018-01-12 DIAGNOSIS — I442 Atrioventricular block, complete: Secondary | ICD-10-CM | POA: Diagnosis not present

## 2018-03-02 DIAGNOSIS — H353131 Nonexudative age-related macular degeneration, bilateral, early dry stage: Secondary | ICD-10-CM | POA: Diagnosis not present

## 2018-03-04 DIAGNOSIS — E782 Mixed hyperlipidemia: Secondary | ICD-10-CM | POA: Diagnosis not present

## 2018-03-04 DIAGNOSIS — I10 Essential (primary) hypertension: Secondary | ICD-10-CM | POA: Diagnosis not present

## 2018-03-04 DIAGNOSIS — I5022 Chronic systolic (congestive) heart failure: Secondary | ICD-10-CM | POA: Diagnosis not present

## 2018-03-04 DIAGNOSIS — I442 Atrioventricular block, complete: Secondary | ICD-10-CM | POA: Diagnosis not present

## 2018-03-04 DIAGNOSIS — I2510 Atherosclerotic heart disease of native coronary artery without angina pectoris: Secondary | ICD-10-CM | POA: Diagnosis not present

## 2018-03-04 DIAGNOSIS — I6523 Occlusion and stenosis of bilateral carotid arteries: Secondary | ICD-10-CM | POA: Diagnosis not present

## 2018-03-12 DIAGNOSIS — I10 Essential (primary) hypertension: Secondary | ICD-10-CM | POA: Diagnosis not present

## 2018-03-22 DIAGNOSIS — S199XXA Unspecified injury of neck, initial encounter: Secondary | ICD-10-CM | POA: Diagnosis not present

## 2018-03-22 DIAGNOSIS — M542 Cervicalgia: Secondary | ICD-10-CM | POA: Diagnosis not present

## 2018-03-22 DIAGNOSIS — E782 Mixed hyperlipidemia: Secondary | ICD-10-CM | POA: Diagnosis not present

## 2018-03-22 DIAGNOSIS — Z0000 Encounter for general adult medical examination without abnormal findings: Secondary | ICD-10-CM | POA: Diagnosis not present

## 2018-03-22 DIAGNOSIS — J438 Other emphysema: Secondary | ICD-10-CM | POA: Diagnosis not present

## 2018-03-22 DIAGNOSIS — I2510 Atherosclerotic heart disease of native coronary artery without angina pectoris: Secondary | ICD-10-CM | POA: Diagnosis not present

## 2018-03-22 DIAGNOSIS — I5022 Chronic systolic (congestive) heart failure: Secondary | ICD-10-CM | POA: Diagnosis not present

## 2018-03-22 DIAGNOSIS — I10 Essential (primary) hypertension: Secondary | ICD-10-CM | POA: Diagnosis not present

## 2018-04-05 DIAGNOSIS — M25512 Pain in left shoulder: Secondary | ICD-10-CM | POA: Diagnosis not present

## 2018-04-05 DIAGNOSIS — M19012 Primary osteoarthritis, left shoulder: Secondary | ICD-10-CM | POA: Diagnosis not present

## 2018-05-13 DIAGNOSIS — J45998 Other asthma: Secondary | ICD-10-CM | POA: Diagnosis not present

## 2018-06-15 DIAGNOSIS — I442 Atrioventricular block, complete: Secondary | ICD-10-CM | POA: Diagnosis not present

## 2018-06-15 DIAGNOSIS — I2510 Atherosclerotic heart disease of native coronary artery without angina pectoris: Secondary | ICD-10-CM | POA: Diagnosis not present

## 2018-06-22 DIAGNOSIS — I10 Essential (primary) hypertension: Secondary | ICD-10-CM | POA: Diagnosis not present

## 2018-06-22 DIAGNOSIS — G619 Inflammatory polyneuropathy, unspecified: Secondary | ICD-10-CM | POA: Diagnosis not present

## 2018-06-22 DIAGNOSIS — J438 Other emphysema: Secondary | ICD-10-CM | POA: Diagnosis not present

## 2018-06-22 DIAGNOSIS — E782 Mixed hyperlipidemia: Secondary | ICD-10-CM | POA: Diagnosis not present

## 2018-06-22 DIAGNOSIS — I2510 Atherosclerotic heart disease of native coronary artery without angina pectoris: Secondary | ICD-10-CM | POA: Diagnosis not present

## 2018-06-22 DIAGNOSIS — I5022 Chronic systolic (congestive) heart failure: Secondary | ICD-10-CM | POA: Diagnosis not present

## 2018-07-07 DIAGNOSIS — I5022 Chronic systolic (congestive) heart failure: Secondary | ICD-10-CM | POA: Diagnosis not present

## 2018-07-07 DIAGNOSIS — I442 Atrioventricular block, complete: Secondary | ICD-10-CM | POA: Diagnosis not present

## 2018-07-07 DIAGNOSIS — I10 Essential (primary) hypertension: Secondary | ICD-10-CM | POA: Diagnosis not present

## 2018-07-07 DIAGNOSIS — I6523 Occlusion and stenosis of bilateral carotid arteries: Secondary | ICD-10-CM | POA: Diagnosis not present

## 2018-07-07 DIAGNOSIS — I2510 Atherosclerotic heart disease of native coronary artery without angina pectoris: Secondary | ICD-10-CM | POA: Diagnosis not present

## 2018-08-30 DIAGNOSIS — H401131 Primary open-angle glaucoma, bilateral, mild stage: Secondary | ICD-10-CM | POA: Diagnosis not present

## 2018-09-06 DIAGNOSIS — H353132 Nonexudative age-related macular degeneration, bilateral, intermediate dry stage: Secondary | ICD-10-CM | POA: Diagnosis not present

## 2018-09-14 DIAGNOSIS — I442 Atrioventricular block, complete: Secondary | ICD-10-CM | POA: Diagnosis not present

## 2018-09-20 DIAGNOSIS — I10 Essential (primary) hypertension: Secondary | ICD-10-CM | POA: Diagnosis not present

## 2018-09-27 DIAGNOSIS — E782 Mixed hyperlipidemia: Secondary | ICD-10-CM | POA: Diagnosis not present

## 2018-09-27 DIAGNOSIS — I6523 Occlusion and stenosis of bilateral carotid arteries: Secondary | ICD-10-CM | POA: Diagnosis not present

## 2018-09-27 DIAGNOSIS — I10 Essential (primary) hypertension: Secondary | ICD-10-CM | POA: Diagnosis not present

## 2018-09-27 DIAGNOSIS — J438 Other emphysema: Secondary | ICD-10-CM | POA: Diagnosis not present

## 2018-09-27 DIAGNOSIS — I2510 Atherosclerotic heart disease of native coronary artery without angina pectoris: Secondary | ICD-10-CM | POA: Diagnosis not present

## 2018-09-27 DIAGNOSIS — I5022 Chronic systolic (congestive) heart failure: Secondary | ICD-10-CM | POA: Diagnosis not present

## 2018-12-06 DIAGNOSIS — R0609 Other forms of dyspnea: Secondary | ICD-10-CM | POA: Diagnosis not present

## 2018-12-06 DIAGNOSIS — J438 Other emphysema: Secondary | ICD-10-CM | POA: Diagnosis not present

## 2018-12-14 DIAGNOSIS — I442 Atrioventricular block, complete: Secondary | ICD-10-CM | POA: Diagnosis not present

## 2018-12-20 DIAGNOSIS — R51 Headache: Secondary | ICD-10-CM | POA: Diagnosis not present

## 2018-12-20 DIAGNOSIS — J019 Acute sinusitis, unspecified: Secondary | ICD-10-CM | POA: Diagnosis not present

## 2018-12-23 DIAGNOSIS — B0230 Zoster ocular disease, unspecified: Secondary | ICD-10-CM | POA: Diagnosis not present

## 2018-12-23 DIAGNOSIS — B028 Zoster with other complications: Secondary | ICD-10-CM | POA: Diagnosis not present

## 2018-12-31 DIAGNOSIS — B0232 Zoster iridocyclitis: Secondary | ICD-10-CM | POA: Diagnosis not present

## 2019-01-05 DIAGNOSIS — E782 Mixed hyperlipidemia: Secondary | ICD-10-CM | POA: Diagnosis not present

## 2019-01-05 DIAGNOSIS — I10 Essential (primary) hypertension: Secondary | ICD-10-CM | POA: Diagnosis not present

## 2019-01-06 DIAGNOSIS — I442 Atrioventricular block, complete: Secondary | ICD-10-CM | POA: Diagnosis not present

## 2019-01-06 DIAGNOSIS — I5022 Chronic systolic (congestive) heart failure: Secondary | ICD-10-CM | POA: Diagnosis not present

## 2019-01-06 DIAGNOSIS — I2510 Atherosclerotic heart disease of native coronary artery without angina pectoris: Secondary | ICD-10-CM | POA: Diagnosis not present

## 2019-01-06 DIAGNOSIS — I10 Essential (primary) hypertension: Secondary | ICD-10-CM | POA: Diagnosis not present

## 2019-01-06 DIAGNOSIS — E782 Mixed hyperlipidemia: Secondary | ICD-10-CM | POA: Diagnosis not present

## 2019-01-07 DIAGNOSIS — B0232 Zoster iridocyclitis: Secondary | ICD-10-CM | POA: Diagnosis not present

## 2019-01-12 DIAGNOSIS — J438 Other emphysema: Secondary | ICD-10-CM | POA: Diagnosis not present

## 2019-01-12 DIAGNOSIS — I10 Essential (primary) hypertension: Secondary | ICD-10-CM | POA: Diagnosis not present

## 2019-01-12 DIAGNOSIS — I5022 Chronic systolic (congestive) heart failure: Secondary | ICD-10-CM | POA: Diagnosis not present

## 2019-01-12 DIAGNOSIS — Z0000 Encounter for general adult medical examination without abnormal findings: Secondary | ICD-10-CM | POA: Diagnosis not present

## 2019-01-12 DIAGNOSIS — B028 Zoster with other complications: Secondary | ICD-10-CM | POA: Diagnosis not present

## 2019-01-12 DIAGNOSIS — I2510 Atherosclerotic heart disease of native coronary artery without angina pectoris: Secondary | ICD-10-CM | POA: Diagnosis not present

## 2019-01-12 DIAGNOSIS — Z0001 Encounter for general adult medical examination with abnormal findings: Secondary | ICD-10-CM | POA: Diagnosis not present

## 2019-01-12 DIAGNOSIS — E782 Mixed hyperlipidemia: Secondary | ICD-10-CM | POA: Diagnosis not present

## 2019-01-12 DIAGNOSIS — I25118 Atherosclerotic heart disease of native coronary artery with other forms of angina pectoris: Secondary | ICD-10-CM | POA: Diagnosis not present

## 2019-01-14 DIAGNOSIS — B0232 Zoster iridocyclitis: Secondary | ICD-10-CM | POA: Diagnosis not present

## 2019-01-24 DIAGNOSIS — B0232 Zoster iridocyclitis: Secondary | ICD-10-CM | POA: Diagnosis not present

## 2019-03-11 DIAGNOSIS — B0232 Zoster iridocyclitis: Secondary | ICD-10-CM | POA: Diagnosis not present

## 2019-03-18 DIAGNOSIS — I10 Essential (primary) hypertension: Secondary | ICD-10-CM | POA: Diagnosis not present

## 2019-03-18 DIAGNOSIS — E782 Mixed hyperlipidemia: Secondary | ICD-10-CM | POA: Diagnosis not present

## 2019-03-18 DIAGNOSIS — I2510 Atherosclerotic heart disease of native coronary artery without angina pectoris: Secondary | ICD-10-CM | POA: Diagnosis not present

## 2019-03-18 DIAGNOSIS — J438 Other emphysema: Secondary | ICD-10-CM | POA: Diagnosis not present

## 2019-03-18 DIAGNOSIS — I5022 Chronic systolic (congestive) heart failure: Secondary | ICD-10-CM | POA: Diagnosis not present

## 2019-03-18 DIAGNOSIS — B028 Zoster with other complications: Secondary | ICD-10-CM | POA: Diagnosis not present

## 2019-03-30 DIAGNOSIS — I5020 Unspecified systolic (congestive) heart failure: Secondary | ICD-10-CM | POA: Diagnosis not present

## 2019-03-30 DIAGNOSIS — J4520 Mild intermittent asthma, uncomplicated: Secondary | ICD-10-CM | POA: Diagnosis not present

## 2019-03-30 DIAGNOSIS — R0602 Shortness of breath: Secondary | ICD-10-CM | POA: Diagnosis not present

## 2019-04-08 DIAGNOSIS — I442 Atrioventricular block, complete: Secondary | ICD-10-CM | POA: Diagnosis not present

## 2019-04-08 DIAGNOSIS — I5022 Chronic systolic (congestive) heart failure: Secondary | ICD-10-CM | POA: Diagnosis not present

## 2019-04-08 DIAGNOSIS — I2510 Atherosclerotic heart disease of native coronary artery without angina pectoris: Secondary | ICD-10-CM | POA: Diagnosis not present

## 2019-04-29 DIAGNOSIS — I5022 Chronic systolic (congestive) heart failure: Secondary | ICD-10-CM | POA: Diagnosis not present

## 2019-04-29 DIAGNOSIS — I442 Atrioventricular block, complete: Secondary | ICD-10-CM | POA: Diagnosis not present

## 2019-05-02 ENCOUNTER — Observation Stay
Admission: EM | Admit: 2019-05-02 | Discharge: 2019-05-04 | Disposition: A | Discharge status: Skilled Nursing Facility | Payer: Medicare HMO | Attending: Internal Medicine | Admitting: Internal Medicine

## 2019-05-02 DIAGNOSIS — I6523 Occlusion and stenosis of bilateral carotid arteries: Secondary | ICD-10-CM | POA: Insufficient documentation

## 2019-05-02 DIAGNOSIS — Z85828 Personal history of other malignant neoplasm of skin: Secondary | ICD-10-CM | POA: Diagnosis not present

## 2019-05-02 DIAGNOSIS — I639 Cerebral infarction, unspecified: Principal | ICD-10-CM

## 2019-05-02 DIAGNOSIS — J449 Chronic obstructive pulmonary disease, unspecified: Secondary | ICD-10-CM | POA: Insufficient documentation

## 2019-05-02 DIAGNOSIS — N400 Enlarged prostate without lower urinary tract symptoms: Secondary | ICD-10-CM | POA: Diagnosis not present

## 2019-05-02 DIAGNOSIS — I2510 Atherosclerotic heart disease of native coronary artery without angina pectoris: Secondary | ICD-10-CM | POA: Diagnosis not present

## 2019-05-02 DIAGNOSIS — Z881 Allergy status to other antibiotic agents status: Secondary | ICD-10-CM | POA: Insufficient documentation

## 2019-05-02 DIAGNOSIS — Z1159 Encounter for screening for other viral diseases: Secondary | ICD-10-CM | POA: Insufficient documentation

## 2019-05-02 DIAGNOSIS — Z8619 Personal history of other infectious and parasitic diseases: Secondary | ICD-10-CM | POA: Insufficient documentation

## 2019-05-02 DIAGNOSIS — I110 Hypertensive heart disease with heart failure: Secondary | ICD-10-CM | POA: Diagnosis not present

## 2019-05-02 DIAGNOSIS — G629 Polyneuropathy, unspecified: Secondary | ICD-10-CM | POA: Diagnosis not present

## 2019-05-02 DIAGNOSIS — Z87891 Personal history of nicotine dependence: Secondary | ICD-10-CM | POA: Diagnosis not present

## 2019-05-02 DIAGNOSIS — R2689 Other abnormalities of gait and mobility: Secondary | ICD-10-CM | POA: Diagnosis not present

## 2019-05-02 DIAGNOSIS — R42 Dizziness and giddiness: Secondary | ICD-10-CM | POA: Diagnosis not present

## 2019-05-02 DIAGNOSIS — G459 Transient cerebral ischemic attack, unspecified: Secondary | ICD-10-CM

## 2019-05-02 DIAGNOSIS — Z955 Presence of coronary angioplasty implant and graft: Secondary | ICD-10-CM | POA: Diagnosis not present

## 2019-05-02 DIAGNOSIS — R2681 Unsteadiness on feet: Secondary | ICD-10-CM | POA: Insufficient documentation

## 2019-05-02 DIAGNOSIS — Z882 Allergy status to sulfonamides status: Secondary | ICD-10-CM | POA: Insufficient documentation

## 2019-05-02 DIAGNOSIS — I509 Heart failure, unspecified: Secondary | ICD-10-CM | POA: Insufficient documentation

## 2019-05-02 DIAGNOSIS — Z79899 Other long term (current) drug therapy: Secondary | ICD-10-CM | POA: Insufficient documentation

## 2019-05-02 DIAGNOSIS — I252 Old myocardial infarction: Secondary | ICD-10-CM | POA: Diagnosis not present

## 2019-05-02 DIAGNOSIS — Z7982 Long term (current) use of aspirin: Secondary | ICD-10-CM | POA: Insufficient documentation

## 2019-05-02 DIAGNOSIS — Z950 Presence of cardiac pacemaker: Secondary | ICD-10-CM | POA: Insufficient documentation

## 2019-05-02 DIAGNOSIS — R112 Nausea with vomiting, unspecified: Secondary | ICD-10-CM | POA: Diagnosis not present

## 2019-05-02 DIAGNOSIS — K219 Gastro-esophageal reflux disease without esophagitis: Secondary | ICD-10-CM | POA: Diagnosis not present

## 2019-05-02 DIAGNOSIS — Z96651 Presence of right artificial knee joint: Secondary | ICD-10-CM | POA: Diagnosis not present

## 2019-05-02 DIAGNOSIS — R29898 Other symptoms and signs involving the musculoskeletal system: Secondary | ICD-10-CM | POA: Insufficient documentation

## 2019-05-02 DIAGNOSIS — I255 Ischemic cardiomyopathy: Secondary | ICD-10-CM | POA: Insufficient documentation

## 2019-05-02 DIAGNOSIS — M6281 Muscle weakness (generalized): Secondary | ICD-10-CM | POA: Insufficient documentation

## 2019-05-02 DIAGNOSIS — Z888 Allergy status to other drugs, medicaments and biological substances status: Secondary | ICD-10-CM | POA: Insufficient documentation

## 2019-05-02 DIAGNOSIS — Z885 Allergy status to narcotic agent status: Secondary | ICD-10-CM | POA: Insufficient documentation

## 2019-05-02 DIAGNOSIS — R51 Headache: Secondary | ICD-10-CM | POA: Diagnosis not present

## 2019-05-02 DIAGNOSIS — Z20828 Contact with and (suspected) exposure to other viral communicable diseases: Secondary | ICD-10-CM | POA: Diagnosis not present

## 2019-05-02 DIAGNOSIS — R519 Headache, unspecified: Secondary | ICD-10-CM

## 2019-05-02 DIAGNOSIS — Z7951 Long term (current) use of inhaled steroids: Secondary | ICD-10-CM | POA: Insufficient documentation

## 2019-05-02 DIAGNOSIS — I442 Atrioventricular block, complete: Secondary | ICD-10-CM | POA: Insufficient documentation

## 2019-05-02 DIAGNOSIS — Z886 Allergy status to analgesic agent status: Secondary | ICD-10-CM | POA: Insufficient documentation

## 2019-05-03 ENCOUNTER — Observation Stay: Payer: Medicare HMO

## 2019-05-03 ENCOUNTER — Emergency Department: Payer: Medicare HMO

## 2019-05-03 ENCOUNTER — Other Ambulatory Visit: Payer: Self-pay

## 2019-05-03 ENCOUNTER — Encounter: Payer: Self-pay | Admitting: *Deleted

## 2019-05-03 DIAGNOSIS — Z950 Presence of cardiac pacemaker: Secondary | ICD-10-CM | POA: Diagnosis not present

## 2019-05-03 DIAGNOSIS — G459 Transient cerebral ischemic attack, unspecified: Secondary | ICD-10-CM | POA: Diagnosis present

## 2019-05-03 DIAGNOSIS — I25110 Atherosclerotic heart disease of native coronary artery with unstable angina pectoris: Secondary | ICD-10-CM | POA: Diagnosis not present

## 2019-05-03 DIAGNOSIS — G629 Polyneuropathy, unspecified: Secondary | ICD-10-CM | POA: Diagnosis not present

## 2019-05-03 DIAGNOSIS — I6523 Occlusion and stenosis of bilateral carotid arteries: Secondary | ICD-10-CM | POA: Diagnosis not present

## 2019-05-03 DIAGNOSIS — I639 Cerebral infarction, unspecified: Secondary | ICD-10-CM | POA: Diagnosis not present

## 2019-05-03 DIAGNOSIS — Z01810 Encounter for preprocedural cardiovascular examination: Secondary | ICD-10-CM | POA: Diagnosis not present

## 2019-05-03 DIAGNOSIS — I10 Essential (primary) hypertension: Secondary | ICD-10-CM | POA: Diagnosis not present

## 2019-05-03 DIAGNOSIS — R51 Headache: Secondary | ICD-10-CM | POA: Diagnosis not present

## 2019-05-03 LAB — COMPREHENSIVE METABOLIC PANEL
ALT: 14 U/L (ref 0–44)
AST: 21 U/L (ref 15–41)
Albumin: 4.3 g/dL (ref 3.5–5.0)
Alkaline Phosphatase: 58 U/L (ref 38–126)
Anion gap: 8 (ref 5–15)
BUN: 13 mg/dL (ref 8–23)
CO2: 26 mmol/L (ref 22–32)
Calcium: 8.7 mg/dL — ABNORMAL LOW (ref 8.9–10.3)
Chloride: 99 mmol/L (ref 98–111)
Creatinine, Ser: 0.69 mg/dL (ref 0.61–1.24)
GFR calc Af Amer: >60 mL/min (ref >60)
GFR calc non Af Amer: >60 mL/min (ref >60)
Glucose, Bld: 146 mg/dL — ABNORMAL HIGH (ref 70–99)
Potassium: 3.7 mmol/L (ref 3.5–5.1)
Sodium: 133 mmol/L — ABNORMAL LOW (ref 135–145)
Total Bilirubin: 0.6 mg/dL (ref 0.3–1.2)
Total Protein: 6.8 g/dL (ref 6.5–8.1)

## 2019-05-03 LAB — URINALYSIS, ROUTINE W REFLEX MICROSCOPIC
Bilirubin Urine: NEGATIVE
Glucose, UA: NEGATIVE mg/dL
Hgb urine dipstick: NEGATIVE
Ketones, ur: NEGATIVE mg/dL
Leukocytes,Ua: NEGATIVE
Nitrite: POSITIVE — AB
Protein, ur: NEGATIVE mg/dL
Specific Gravity, Urine: 1.009 (ref 1.005–1.030)
pH: 7 (ref 5.0–8.0)

## 2019-05-03 LAB — CBC WITH DIFFERENTIAL/PLATELET
Abs Immature Granulocytes: 0.03 10*3/uL (ref 0.00–0.07)
Basophils Absolute: 0.1 10*3/uL (ref 0.0–0.1)
Basophils Relative: 1 %
Eosinophils Absolute: 0.2 10*3/uL (ref 0.0–0.5)
Eosinophils Relative: 4 %
HCT: 35.3 % — ABNORMAL LOW (ref 39.0–52.0)
Hemoglobin: 12.3 g/dL — ABNORMAL LOW (ref 13.0–17.0)
Immature Granulocytes: 1 %
Lymphocytes Relative: 13 %
Lymphs Abs: 0.8 10*3/uL (ref 0.7–4.0)
MCH: 34.4 pg — ABNORMAL HIGH (ref 26.0–34.0)
MCHC: 34.8 g/dL (ref 30.0–36.0)
MCV: 98.6 fL (ref 80.0–100.0)
Monocytes Absolute: 0.4 10*3/uL (ref 0.1–1.0)
Monocytes Relative: 7 %
Neutro Abs: 4.5 10*3/uL (ref 1.7–7.7)
Neutrophils Relative %: 74 %
Platelets: 161 10*3/uL (ref 150–400)
RBC: 3.58 MIL/uL — ABNORMAL LOW (ref 4.22–5.81)
RDW: 12.3 % (ref 11.5–15.5)
WBC: 6 10*3/uL (ref 4.0–10.5)
nRBC: 0 % (ref 0.0–0.2)

## 2019-05-03 LAB — ETHANOL: Alcohol, Ethyl (B): <10 mg/dL (ref <10)

## 2019-05-03 LAB — APTT: aPTT: 30 seconds (ref 24–36)

## 2019-05-03 LAB — TYPE AND SCREEN
ABO/RH(D): O NEG
Antibody Screen: NEGATIVE

## 2019-05-03 LAB — LIPID PANEL
Cholesterol: 150 mg/dL (ref 0–200)
HDL: 69 mg/dL (ref >40)
LDL Cholesterol: 73 mg/dL (ref 0–99)
Total CHOL/HDL Ratio: 2.2 RATIO
Triglycerides: 42 mg/dL (ref <150)
VLDL: 8 mg/dL (ref 0–40)

## 2019-05-03 LAB — PROTIME-INR
INR: 1 (ref 0.8–1.2)
Prothrombin Time: 13.1 seconds (ref 11.4–15.2)

## 2019-05-03 LAB — URINE DRUG SCREEN, QUALITATIVE (ARMC ONLY)
Amphetamines, Ur Screen: NOT DETECTED
Barbiturates, Ur Screen: NOT DETECTED
Benzodiazepine, Ur Scrn: POSITIVE — AB
Cannabinoid 50 Ng, Ur ~~LOC~~: NOT DETECTED
Cocaine Metabolite,Ur ~~LOC~~: NOT DETECTED
MDMA (Ecstasy)Ur Screen: NOT DETECTED
Methadone Scn, Ur: NOT DETECTED
Opiate, Ur Screen: NOT DETECTED
Phencyclidine (PCP) Ur S: NOT DETECTED
Tricyclic, Ur Screen: NOT DETECTED

## 2019-05-03 LAB — SARS CORONAVIRUS 2 BY RT PCR (HOSPITAL ORDER, PERFORMED IN ~~LOC~~ HOSPITAL LAB): SARS Coronavirus 2: NEGATIVE

## 2019-05-03 LAB — TROPONIN I (HIGH SENSITIVITY): Troponin I (High Sensitivity): 11 ng/L (ref <18)

## 2019-05-03 MED ORDER — ALBUTEROL SULFATE (2.5 MG/3ML) 0.083% IN NEBU
2.5000 mg | INHALATION_SOLUTION | RESPIRATORY_TRACT | Status: DC | PRN
  Administered 2019-05-03 (×2): 2.5 mg via RESPIRATORY_TRACT
  Filled 2019-05-03 (×2): qty 3

## 2019-05-03 MED ORDER — MONTELUKAST SODIUM 10 MG PO TABS
10.0000 mg | ORAL_TABLET | Freq: Every day | ORAL | Status: DC
  Administered 2019-05-03: 23:00:00 10 mg via ORAL
  Filled 2019-05-03: qty 1

## 2019-05-03 MED ORDER — PANTOPRAZOLE SODIUM 40 MG PO TBEC
40.0000 mg | DELAYED_RELEASE_TABLET | Freq: Every day | ORAL | Status: DC
  Administered 2019-05-04: 10:00:00 40 mg via ORAL
  Filled 2019-05-03: qty 1

## 2019-05-03 MED ORDER — SODIUM CHLORIDE 0.9 % IV SOLN
INTRAVENOUS | Status: DC
  Administered 2019-05-03: 05:00:00 via INTRAVENOUS

## 2019-05-03 MED ORDER — ASPIRIN EC 81 MG PO TBEC
81.0000 mg | DELAYED_RELEASE_TABLET | Freq: Every day | ORAL | Status: DC
  Administered 2019-05-03 – 2019-05-04 (×2): 81 mg via ORAL
  Filled 2019-05-03 (×2): qty 1

## 2019-05-03 MED ORDER — DOXAZOSIN MESYLATE 8 MG PO TABS
8.0000 mg | ORAL_TABLET | Freq: Every day | ORAL | Status: DC
  Administered 2019-05-03: 23:00:00 8 mg via ORAL
  Filled 2019-05-03 (×2): qty 1

## 2019-05-03 MED ORDER — METOCLOPRAMIDE HCL 5 MG/ML IJ SOLN
5.0000 mg | Freq: Four times a day (QID) | INTRAMUSCULAR | Status: DC | PRN
  Administered 2019-05-03 – 2019-05-04 (×2): 5 mg via INTRAVENOUS
  Filled 2019-05-03 (×2): qty 2

## 2019-05-03 MED ORDER — ACETAMINOPHEN 650 MG RE SUPP: 650.0000 mg | RECTAL | Status: DC | PRN

## 2019-05-03 MED ORDER — ONDANSETRON HCL 4 MG/2ML IJ SOLN
4.0000 mg | INTRAMUSCULAR | Status: AC
  Administered 2019-05-03: 4 mg via INTRAVENOUS
  Filled 2019-05-03: qty 2

## 2019-05-03 MED ORDER — ACETAMINOPHEN 325 MG PO TABS
650.0000 mg | ORAL_TABLET | ORAL | Status: DC | PRN
  Administered 2019-05-03 – 2019-05-04 (×3): 650 mg via ORAL
  Filled 2019-05-03 (×3): qty 2

## 2019-05-03 MED ORDER — STROKE: EARLY STAGES OF RECOVERY BOOK
Freq: Once | Status: AC
  Administered 2019-05-03: 06:00:00

## 2019-05-03 MED ORDER — MECLIZINE HCL 12.5 MG PO TABS
12.5000 mg | ORAL_TABLET | Freq: Once | ORAL | Status: AC
  Administered 2019-05-03: 12.5 mg via ORAL
  Filled 2019-05-03: qty 1

## 2019-05-03 MED ORDER — LORAZEPAM 0.5 MG PO TABS
0.5000 mg | ORAL_TABLET | Freq: Three times a day (TID) | ORAL | Status: DC
  Administered 2019-05-03 (×3): 0.5 mg via ORAL
  Filled 2019-05-03 (×4): qty 1

## 2019-05-03 MED ORDER — VITAMIN D 25 MCG (1000 UNIT) PO TABS
1000.0000 [IU] | ORAL_TABLET | Freq: Every day | ORAL | Status: DC
  Administered 2019-05-04: 1000 [IU] via ORAL
  Filled 2019-05-03: qty 1

## 2019-05-03 MED ORDER — NEBIVOLOL HCL 2.5 MG PO TABS
2.5000 mg | ORAL_TABLET | Freq: Three times a day (TID) | ORAL | Status: DC
  Administered 2019-05-03 – 2019-05-04 (×2): 2.5 mg via ORAL
  Filled 2019-05-03 (×6): qty 1

## 2019-05-03 MED ORDER — LATANOPROST 0.005 % OP SOLN
1.0000 [drp] | Freq: Every day | OPHTHALMIC | Status: DC
  Administered 2019-05-03: 1 [drp] via OPHTHALMIC
  Filled 2019-05-03: qty 2.5

## 2019-05-03 MED ORDER — ADULT MULTIVITAMIN W/MINERALS CH
1.0000 | ORAL_TABLET | Freq: Every day | ORAL | Status: DC
  Administered 2019-05-04: 1 via ORAL
  Filled 2019-05-03: qty 1

## 2019-05-03 MED ORDER — FLUTICASONE PROPIONATE 50 MCG/ACT NA SUSP
2.0000 | Freq: Every day | NASAL | Status: DC
  Filled 2019-05-03: qty 16

## 2019-05-03 MED ORDER — PREDNISOLONE ACETATE 1 % OP SUSP
1.0000 [drp] | Freq: Every day | OPHTHALMIC | Status: DC
  Administered 2019-05-04: 1 [drp] via OPHTHALMIC
  Filled 2019-05-03: qty 1

## 2019-05-03 MED ORDER — FERROUS SULFATE 325 (65 FE) MG PO TABS
325.0000 mg | ORAL_TABLET | Freq: Every day | ORAL | Status: DC
  Administered 2019-05-04: 10:00:00 325 mg via ORAL
  Filled 2019-05-03: qty 1

## 2019-05-03 MED ORDER — ENOXAPARIN SODIUM 40 MG/0.4ML ~~LOC~~ SOLN
40.0000 mg | SUBCUTANEOUS | Status: DC
  Administered 2019-05-03 – 2019-05-04 (×2): 40 mg via SUBCUTANEOUS
  Filled 2019-05-03 (×2): qty 0.4

## 2019-05-03 MED ORDER — SENNOSIDES-DOCUSATE SODIUM 8.6-50 MG PO TABS: 1.0000 | ORAL_TABLET | Freq: Every evening | ORAL | Status: DC | PRN

## 2019-05-03 MED ORDER — ACETAMINOPHEN 160 MG/5ML PO SOLN
650.0000 mg | ORAL | Status: DC | PRN
  Filled 2019-05-03: qty 20.3

## 2019-05-03 MED ORDER — ONDANSETRON HCL 4 MG/2ML IJ SOLN
4.0000 mg | Freq: Four times a day (QID) | INTRAMUSCULAR | Status: DC | PRN
  Administered 2019-05-03 (×3): 4 mg via INTRAVENOUS
  Filled 2019-05-03 (×3): qty 2

## 2019-05-03 NOTE — TOC Initial Note (Signed)
Transition of Care [facility]) - Initial/Assessment Note    Patient Details  Name: Darrell Jacobson MRN: [id number] Date of Birth: 16-May-1928  [facility] CM/SW Contact:    Darrell Jacobson, Darrell Beets, LCSW Phone Number: [phone]  05/03/2019, 4:55 PM  Clinical Narrative:                  PT is recommending SNF. Clinical Education officer, museum (CSW) met with patient alone at bedside to discuss D/C plan. Patient was alert and oriented X4 and was laying in the bed. CSW introduced self and explained role of CSW department. Per patient he lives alone in [place] however his son Darrell Jacobson lives close by. Per patient his son checks on him frequently. Patient is agreeable to SNF search in [facility] but prefers to go home if he can improve with PT. Patient is aware that [facility] will have to approve SNF. FL2 complete and faxed out. CSW started [facility] SNF authorization through [place] today. CSW will continue to follow and assist as needed.        Patient Goals and CMS Choice        Expected Discharge Plan and Services                                                Prior Living Arrangements/Services                       Activities of Daily Living Home Assistive Devices/Equipment: Blood pressure cuff ADL Screening (condition at time of admission) Patient's cognitive ability adequate to safely complete daily activities?: Yes Is the patient deaf or have difficulty hearing?: No Does the patient have difficulty seeing, even when wearing glasses/contacts?: Yes Does the patient have difficulty concentrating, remembering, or making decisions?: No Patient able to express need for assistance with ADLs?: Yes Does the patient have difficulty dressing or bathing?: Yes Independently performs ADLs?: Yes (appropriate for developmental age) Does the patient have difficulty walking or climbing stairs?: No Weakness of Legs: Both Weakness of Arms/Hands: Both  Permission  Sought/Granted                  Emotional Assessment              Admission diagnosis:  TIA (transient ischemic attack) [G45.9] Acute nonintractable headache, unspecified headache type [R51] Nausea and vomiting, intractability of vomiting not specified, unspecified vomiting type [R11.2] Patient Active Problem List   Diagnosis Date Noted  . TIA (transient ischemic attack) 05/03/2019   PCP:  Baxter Hire, MD Pharmacy:   [facility] [address], [place] - [place] [address] Phone: [phone] Fax: [phone]     Social Determinants of Health (SDOH) Interventions    Readmission Risk Interventions No flowsheet data found.

## 2019-05-03 NOTE — ED Notes (Signed)
nih scale 0 per tele neuro.

## 2019-05-03 NOTE — Consult Note (Signed)
Referring Physician: Sudini    Chief Complaint: Numbness, weakness  HPI:  Darrell Jacobson is an 85 y.o. male with multiple medical problems who is a very poor historian.  It seems that at some time on yesterday around 11PM he was talking to family and became incoherent.  Patient also reports a numbness that developed on the right side of his face and his left upper extremity.  Reports being unable to control his LUE and some dizziness as well.  Patient with chronic dizziness.  Patient was brought to the ED where he felt some of the symptoms were better but he reports at that time he became more dizzy and has has nausea and vomiting since that time.  Today he reports numbness in his right arm along with the other symptoms.  LE's are always numb due to neuropathy.    Date last known well: Date: 05/02/2019 Time last known well: Time: 23:00 tPA Given: No: Improvement in symptoms  Past Medical History:  Diagnosis Date  . Arthritis   . Asthma    COPD with inhaler use  . Cancer [facility]) 2013   Basal Cell Skin CA resected from scalp.  . Carotid stenosis    pateint states he has Left carotid blockage worse than Right.   . CHF (congestive heart failure) ([place])   . Collagen vascular disease ([place])   . COPD (chronic obstructive pulmonary disease) ([place])   . Coronary artery disease   . Dyspnea   . GERD (gastroesophageal reflux disease)   . Glaucoma   . History of hiatal hernia   . History of shingles 2012   forehead  . Hypertension   . Multilevel degenerative disc disease   . Neuropathy    legs and feet  . Pacemaker 2006    Past Surgical History:  Procedure Laterality Date  . BACK SURGERY     2 lumbar, 1 thoracic  . CATARACT EXTRACTION W/PHACO Right 12/24/2016   Procedure: CATARACT EXTRACTION PHACO AND INTRAOCULAR LENS PLACEMENT ([place])  Right  complicated;  Surgeon: Leandrew Koyanagi, MD;  Location: [place];  Service: Ophthalmology;  Laterality: Right;  [place]  . JOINT  REPLACEMENT Right    knee  . SKIN DEBRIDEMENT  06/13/2006   facial  . SKIN GRAFT Right 07/11/2016   facial    No family history on file.   Social History:  reports that he quit smoking about 34 years ago. He has never used smokeless tobacco. He reports current alcohol use of about 14.0 standard drinks of alcohol per week. He reports previous drug use.  Allergies:  Allergies  Allergen Reactions  . Prednisone Anxiety and Other (See Comments)  . Acyclovir Other (See Comments)  . Albuterol Sulfate Other (See Comments)  . Amlodipine Other (See Comments)  . Brinzolamide Other (See Comments)  . Budesonide-Formoterol Fumarate Other (See Comments)  . Buspirone Other (See Comments)  . Cefdinir Other (See Comments)  . Celecoxib Other (See Comments)  . Chlorthalidone Other (See Comments)  . Clopidogrel Nausea Only  . Codeine Nausea And Vomiting  . Diazepam Other (See Comments)  . Dicyclomine Other (See Comments)  . Duloxetine Other (See Comments)  . Famciclovir Other (See Comments)  . Felodipine Other (See Comments)  . Furosemide Other (See Comments)  . Gabapentin Swelling  . Hydrochlorothiazide Other (See Comments)  . Indomethacin Other (See Comments)  . Lisinopril Other (See Comments)  . Losartan Other (See Comments)  . Meclizine Other (See Comments)  . Meloxicam Other (See Comments)  .  Metaxalone Other (See Comments)  . Metoprolol Tartrate Other (See Comments)  . Morphine Other (See Comments)  . Nabumetone Other (See Comments)  . Naproxen Other (See Comments)  . Olmesartan Other (See Comments)  . Oxycodone     hallucinations  . Penicillin V Potassium Other (See Comments)  . Prasugrel Other (See Comments)    weakness  . Promethazine Other (See Comments)  . Sulfa Antibiotics Other (See Comments)  . Torsemide Other (See Comments)  . Tramadol Other (See Comments)  . Zolpidem Other (See Comments)    Medications:  I have reviewed the patient's current medications. Prior  to Admission:  Medications Prior to Admission  Medication Sig Dispense Refill Last Dose  . acetaminophen (TYLENOL) 500 MG tablet Take 1 tablet by mouth as needed.   prn at prn  . albuterol (PROAIR HFA) 108 (90 BASE) MCG/ACT inhaler Inhale 2 puffs into the lungs every 6 (six) hours as needed.   prn at prn  . aspirin EC 81 MG tablet Take 1 tablet by mouth daily.   05/02/2019 at 0900  . bimatoprost (LUMIGAN) 0.01 % SOLN Apply 1 Dose to eye at bedtime.   05/02/2019 at 2100  . Cholecalciferol (D 2000) 2000 UNITS TABS Take 1 tablet by mouth daily.   05/02/2019 at 0900  . doxazosin (CARDURA) 8 MG tablet Take 1 tablet by mouth at bedtime.   05/02/2019 at 2100  . Ferrous Sulfate (SLOW FE PO) Take by mouth.   05/02/2019 at 2100  . fluticasone (FLONASE) 50 MCG/ACT nasal spray Place 2 sprays into the nose daily.     Marland Kitchen LORazepam (ATIVAN) 0.5 MG tablet Take 1 tablet by mouth 3 (three) times daily.   05/02/2019 at 2100  . montelukast (SINGULAIR) 10 MG tablet Take 1 tablet by mouth at bedtime.   05/02/2019 at 2100  . Multiple Vitamin (MULTI-VITAMINS) TABS Take 1 tablet by mouth daily.   05/02/2019 at 0900  . nebivolol (BYSTOLIC) 2.5 MG tablet Take 1 tablet by mouth 3 (three) times daily.   05/02/2019 at 2100  . omeprazole (PRILOSEC) 20 MG capsule Take 1 capsule by mouth daily.   05/02/2019 at 0900  . prednisoLONE acetate (PRED FORTE) 1 % ophthalmic suspension Place 1 drop into the right eye daily.   05/02/2019 at 0900   Scheduled: . aspirin EC  81 mg Oral Daily  . cholecalciferol  1,000 Units Oral Daily  . doxazosin  8 mg Oral QHS  . enoxaparin (LOVENOX) injection  40 mg Subcutaneous Q24H  . ferrous sulfate  325 mg Oral Q breakfast  . fluticasone  2 spray Each Nare Daily  . latanoprost  1 drop Both Eyes QHS  . LORazepam  0.5 mg Oral TID  . montelukast  10 mg Oral QHS  . multivitamin with minerals  1 tablet Oral Daily  . nebivolol  2.5 mg Oral TID  . pantoprazole  40 mg Oral Daily  . prednisoLONE acetate  1 drop  Right Eye Daily    ROS: History obtained from the patient  General ROS: negative for - chills, fatigue, fever, night sweats, weight gain or weight loss Psychological ROS: negative for - behavioral disorder, hallucinations, memory difficulties, mood swings or suicidal ideation Ophthalmic ROS: blurry vision ENT ROS: vertigo Allergy and Immunology ROS: negative for - hives or itchy/watery eyes Hematological and Lymphatic ROS: negative for - bleeding problems, bruising or swollen lymph nodes Endocrine ROS: negative for - galactorrhea, hair pattern changes, polydipsia/polyuria or temperature intolerance Respiratory ROS: negative for -  cough, hemoptysis, shortness of breath or wheezing Cardiovascular ROS: negative for - chest pain, dyspnea on exertion, edema or irregular heartbeat Gastrointestinal ROS: nausea/vomiting Genito-Urinary ROS: negative for - dysuria, hematuria, incontinence or urinary frequency/urgency Musculoskeletal ROS: negative for - joint swelling or muscular weakness Neurological ROS: as noted in HPI Dermatological ROS: skin lesion in forehead  Physical Examination: Blood pressure (!) 150/56, pulse 60, temperature 97.8 F (36.6 C), temperature source Oral, resp. rate 18, height 5' 5.98" (1.676 m), weight 78.5 kg, SpO2 97 %.  HEENT-  Normocephalic, no lesions, without obvious abnormality.  Normal external eye and conjunctiva.  Normal TM's bilaterally.  Normal auditory canals and external ears. Normal external nose, mucus membranes and septum.  Normal pharynx. Cardiovascular- S1, S2 normal, pulses palpable throughout   Lungs- chest clear, no wheezing, rales, normal symmetric air entry Abdomen- soft, non-tender; bowel sounds normal; no masses,  no organomegaly Extremities- minimal edema Lymph-no adenopathy palpable Musculoskeletal-right shoulder pain Skin-warm and dry, no hyperpigmentation, vitiligo, or suspicious lesions  Neurological Examination   Mental Status: Alert  but provides only limited cooperation due to feeling poorly.  Oriented, thought content appropriate.  Speech fluent without evidence of aphasia.  Mild dysarthria.  Able to follow 3 step commands without difficulty. Cranial Nerves: II: Discs flat bilaterally; Visual fields grossly normal, pupils equal, round, reactive to light and accommodation III,IV, VI: ptosis not present, extra-ocular motions intact bilaterally V,VII: left facial droop, facial light touch sensation decreased on the right VIII: hearing normal bilaterally IX,X: gag reflex present XI: bilateral shoulder shrug XII: midline tongue extension Motor: Lifts all extremities off the bed with no focal weakness appreciated.   Sensory: Pinprick and light touch decreased in the LUE and LLE Deep Tendon Reflexes: Symmetric throughout Plantars: Right: upgoing   Left: upgoing Cerebellar: Dysmetria with finger-to-nose testing of the LUE and heel-to-shin testing of the LLE Gait: not tested due to safety concerns    Laboratory Studies:  Basic Metabolic Panel: Recent Labs  Lab 05/03/19 0013  NA 133*  K 3.7  CL 99  CO2 26  GLUCOSE 146*  BUN 13  CREATININE 0.69  CALCIUM 8.7*    Liver Function Tests: Recent Labs  Lab 05/03/19 0013  AST 21  ALT 14  ALKPHOS 58  BILITOT 0.6  PROT 6.8  ALBUMIN 4.3   No results for input(s): LIPASE, AMYLASE in the last 168 hours. No results for input(s): AMMONIA in the last 168 hours.  CBC: Recent Labs  Lab 05/03/19 0013  WBC 6.0  NEUTROABS 4.5  HGB 12.3*  HCT 35.3*  MCV 98.6  PLT 161    Cardiac Enzymes: No results for input(s): CKTOTAL, CKMB, CKMBINDEX, TROPONINI in the last 168 hours.  BNP: Invalid input(s): POCBNP  CBG: No results for input(s): GLUCAP in the last 168 hours.  Microbiology: Results for orders placed or performed during the hospital encounter of 05/02/19  SARS Coronavirus 2 (CEPHEID - Performed in [place] hospital lab), Hosp Order     Status: None    Collection Time: 05/03/19 12:36 AM   Specimen: Nasopharyngeal Swab  Result Value Ref Range Status   SARS Coronavirus 2 NEGATIVE NEGATIVE Final    Comment: (NOTE) If result is NEGATIVE SARS-CoV-2 target nucleic acids are NOT DETECTED. The SARS-CoV-2 RNA is generally detectable in upper and lower  respiratory specimens during the acute phase of infection. The lowest  concentration of SARS-CoV-2 viral copies this assay can detect is 250  copies / mL. A negative result does not preclude  SARS-CoV-2 infection  and should not be used as the sole basis for treatment or other  patient management decisions.  A negative result may occur with  improper specimen collection / handling, submission of specimen other  than nasopharyngeal swab, presence of viral mutation(s) within the  areas targeted by this assay, and inadequate number of viral copies  (<250 copies / mL). A negative result must be combined with clinical  observations, patient history, and epidemiological information. If result is POSITIVE SARS-CoV-2 target nucleic acids are DETECTED. The SARS-CoV-2 RNA is generally detectable in upper and lower  respiratory specimens dur ing the acute phase of infection.  Positive  results are indicative of active infection with SARS-CoV-2.  Clinical  correlation with patient history and other diagnostic information is  necessary to determine patient infection status.  Positive results do  not rule out bacterial infection or co-infection with other viruses. If result is PRESUMPTIVE POSTIVE SARS-CoV-2 nucleic acids MAY BE PRESENT.   A presumptive positive result was obtained on the submitted specimen  and confirmed on repeat testing.  While 2019 novel coronavirus  (SARS-CoV-2) nucleic acids may be present in the submitted sample  additional confirmatory testing may be necessary for epidemiological  and / or clinical management purposes  to differentiate between  SARS-CoV-2 and other Sarbecovirus  currently known to infect humans.  If clinically indicated additional testing with an alternate test  methodology [phone]) is advised. The SARS-CoV-2 RNA is generally  detectable in upper and lower respiratory sp ecimens during the acute  phase of infection. The expected result is Negative. Fact Sheet for Patients:  StrictlyIdeas.no Fact Sheet for Healthcare Providers: BankingDealers.co.za This test is not yet approved or cleared by the [place] FDA and has been authorized for detection and/or diagnosis of SARS-CoV-2 by FDA under an Emergency Use Authorization (EUA).  This EUA will remain in effect (meaning this test can be used) for the duration of the COVID-19 declaration under Section 564(b)(1) of the Act, 21 U.S.C. section 360bbb-3(b)(1), unless the authorization is terminated or revoked sooner. Performed at [facility], [address] [place], [place] [address]     Coagulation Studies: Recent Labs    05/03/19 0013  LABPROT 13.1  INR 1.0    Urinalysis:  Recent Labs  Lab 05/03/19 0255  COLORURINE YELLOW*  LABSPEC 1.009  PHURINE 7.0  GLUCOSEU NEGATIVE  HGBUR NEGATIVE  BILIRUBINUR NEGATIVE  KETONESUR NEGATIVE  PROTEINUR NEGATIVE  NITRITE POSITIVE*  LEUKOCYTESUR NEGATIVE    Lipid Panel:    Component Value Date/Time   CHOL 150 05/03/2019 0506   CHOL 180 03/16/2014 0150   TRIG 42 05/03/2019 0506   TRIG 169 03/16/2014 0150   HDL 69 05/03/2019 0506   HDL 59 03/16/2014 0150   CHOLHDL 2.2 05/03/2019 0506   VLDL 8 05/03/2019 0506   VLDL 34 03/16/2014 0150   LDLCALC 73 05/03/2019 0506   LDLCALC 87 03/16/2014 0150    HgbA1C: No results found for: HGBA1C  Urine Drug Screen:      Component Value Date/Time   LABOPIA NONE DETECTED 05/03/2019 0255   COCAINSCRNUR NONE DETECTED 05/03/2019 0255   LABBENZ POSITIVE (A) 05/03/2019 0255   AMPHETMU NONE DETECTED 05/03/2019 0255   THCU NONE DETECTED  05/03/2019 0255   LABBARB NONE DETECTED 05/03/2019 0255    Alcohol Level:  Recent Labs  Lab 05/03/19 0013  ETH <10    Other results: EKG: paced rhythm at 60 bpm.  Imaging: US Carotid Bilateral (at [facility]  Only)  Result Date: 05/03/2019 CLINICAL DATA:  Hypertension, stroke, syncope EXAM: BILATERAL CAROTID DUPLEX ULTRASOUND TECHNIQUE: Pearline Cables scale imaging, color Doppler and duplex ultrasound were performed of bilateral carotid and vertebral arteries in the neck. COMPARISON:  11/05/2017 FINDINGS: Criteria: Quantification of carotid stenosis is based on velocity parameters that correlate the residual internal carotid diameter with NASCET-based stenosis levels, using the diameter of the distal internal carotid lumen as the denominator for stenosis measurement. The following velocity measurements were obtained: RIGHT ICA: 126/22 cm/sec CCA: 10/25 cm/sec SYSTOLIC ICA/CCA RATIO:  1.4 ECA: 103 cm/sec LEFT ICA: 157/21 cm/sec CCA: 85/27 cm/sec SYSTOLIC ICA/CCA RATIO:  1.8 ECA: 158 cm/sec RIGHT CAROTID ARTERY: Similar severe calcified plaque formation of the right carotid bifurcation. However, despite this there is no hemodynamically significant right ICA stenosis, velocity elevation, turbulent flow. Degree of narrowing still estimated at 50-69% by ultrasound criteria. RIGHT VERTEBRAL ARTERY:  Antegrade LEFT CAROTID ARTERY: Similar severe left carotid bifurcation atherosclerosis with echogenic shadowing calcification. Slight velocity elevation measuring 157/21 centimeters/second. No significant turbulent flow. Degree of stenosis estimated at 50-69% by ultrasound criteria. LEFT VERTEBRAL ARTERY:  Antegrade IMPRESSION: Severe bilateral carotid atherosclerosis with similar ICA 50-69% stenoses bilaterally. No significant interval change. Patent antegrade vertebral flow bilaterally. Electronically Signed   By: Jerilynn Mages.  Shick M.D.   On: 05/03/2019 09:27   Ct Head Code Stroke Wo Contrast  Result Date:  05/03/2019 CLINICAL DATA:  Code stroke. 85 y/o M; headache and dizziness. Focal neuro deficit, < 6 hrs, stroke suspected Altered level of consciousness (LOC), unexplained. EXAM: CT HEAD WITHOUT CONTRAST TECHNIQUE: Contiguous axial images were obtained from the base of the skull through the vertex without intravenous contrast. COMPARISON:  03/12/2015 CT head. FINDINGS: Brain: No evidence of acute infarction, hemorrhage, hydrocephalus, extra-axial collection or mass lesion/mass effect. Stable small chronic infarctions within the right caudate head and right thalamus. Stable small chronic cortical infarction in the right parietal lobe. Interval very small chronic cortical infarction in the left parietal lobe. Stable chronic microvascular ischemic changes of white matter and volume loss of the brain. Vascular: Calcific atherosclerosis of the internal carotid arteries. No hyperdense vessel. Skull: Normal. Negative for fracture or focal lesion. Sinuses/Orbits: No acute finding. Other: Right intra-ocular lens replacement. ASPECTS [facility] Stroke Program Early CT Score) - Ganglionic level infarction (caudate, lentiform nuclei, internal capsule, insula, M1-M3 cortex): 7 - Supraganglionic infarction (M4-M6 cortex): 3 Total score (0-10 with 10 being normal): 10 IMPRESSION: 1. No acute intracranial abnormality identified. 2. ASPECTS is 10. 3. New very small chronic cortical infarction in the left parietal lobe. Otherwise stable chronic microvascular ischemic changes, parenchymal volume loss, and small chronic infarctions of the brain. These results were called by telephone at the time of interpretation on 05/03/2019 at 12:32 am to Dr. Hinda Kehr , who verbally acknowledged these results. Electronically Signed   By: Kristine Garbe M.D.   On: 05/03/2019 00:34    Assessment: 85 y.o. male with multiple medical problems presenting with complaints of numbness, dizziness and speech deficits. Patient on ASA at home.  Head  CT performed and shows no acute changes.  MRI unable to be performed due to pacemaker but suspect an acute brain stem infarct.  Carotid dopplers show 50-69% stenosis bilaterally.  A1c pending, LDL 73.  Stroke Risk Factors - carotid stenosis and hypertension  Plan: 1. CTA of the head and neck 2. Echocardiogram 3. PT consult, OT consult, Speech consult 4. Prophylactic therapy-Would continue ASA.  Unable to start Plavix due to allergy 5. NPO  until RN stroke swallow screen 6. Telemetry monitoring 7. Frequent neuro checks 8. Statin for lipid management with target LDL<70.   Alexis Goodell, MD Neurology [phone] 05/03/2019, 12:02 PM

## 2019-05-03 NOTE — H&P (Signed)
Darrell Jacobson is an 85 y.o. male.   Chief Complaint: Slurred speech HPI: The patient with past medical history of hypertension, basal cell skin cancer status post resection and skin grafting; coronary artery disease and carotid stenosis presents to the emergency department complaining of slurred speech.  Apparently he was on the phone with a relative when his speech became unclear.  The patient also reports feeling dizzy and numb across his forehead, right face and left arm.  CT of his head in the emergency department was negative for acute process.  By the time this examiner was called to see the patient symptoms had resolved except for some subjective neglect of his left arm.  The patient denies chest pain, palpitations, or shortness of breath.  Once patient was able as the emergency department staff called the hospitalist service for admission.  Past Medical History:  Diagnosis Date  . Arthritis   . Asthma    COPD with inhaler use  . Cancer [facility]) 2013   Basal Cell Skin CA resected from scalp.  . Carotid stenosis    pateint states he has Left carotid blockage worse than Right.   . CHF (congestive heart failure) ([place])   . Collagen vascular disease ([place])   . COPD (chronic obstructive pulmonary disease) ([place])   . Coronary artery disease   . Dyspnea   . GERD (gastroesophageal reflux disease)   . Glaucoma   . History of hiatal hernia   . History of shingles 2012   forehead  . Hypertension   . Multilevel degenerative disc disease   . Neuropathy    legs and feet  . Pacemaker 2006    Past Surgical History:  Procedure Laterality Date  . BACK SURGERY     2 lumbar, 1 thoracic  . CATARACT EXTRACTION W/PHACO Right 12/24/2016   Procedure: CATARACT EXTRACTION PHACO AND INTRAOCULAR LENS PLACEMENT ([place])  Right  complicated;  Surgeon: Leandrew Koyanagi, MD;  Location: [facility];  Service: Ophthalmology;  Laterality: Right;  [place]  . JOINT REPLACEMENT Right    knee  . SKIN  DEBRIDEMENT  06/13/2006   facial  . SKIN GRAFT Right 07/11/2016   facial    No family history on file. Social History:  reports that he quit smoking about 34 years ago. He has never used smokeless tobacco. He reports current alcohol use of about 14.0 standard drinks of alcohol per week. He reports previous drug use.  Allergies:  Allergies  Allergen Reactions  . Prednisone Anxiety and Other (See Comments)  . Acyclovir Other (See Comments)  . Albuterol Sulfate Other (See Comments)  . Amlodipine Other (See Comments)  . Brinzolamide Other (See Comments)  . Budesonide-Formoterol Fumarate Other (See Comments)  . Buspirone Other (See Comments)  . Cefdinir Other (See Comments)  . Celecoxib Other (See Comments)  . Chlorthalidone Other (See Comments)  . Clopidogrel Nausea Only  . Codeine Nausea And Vomiting  . Diazepam Other (See Comments)  . Dicyclomine Other (See Comments)  . Duloxetine Other (See Comments)  . Famciclovir Other (See Comments)  . Felodipine Other (See Comments)  . Furosemide Other (See Comments)  . Gabapentin Swelling  . Hydrochlorothiazide Other (See Comments)  . Indomethacin Other (See Comments)  . Lisinopril Other (See Comments)  . Losartan Other (See Comments)  . Meclizine Other (See Comments)  . Meloxicam Other (See Comments)  . Metaxalone Other (See Comments)  . Metoprolol Tartrate Other (See Comments)  . Morphine Other (See Comments)  . Nabumetone Other (See  Comments)  . Naproxen Other (See Comments)  . Olmesartan Other (See Comments)  . Oxycodone     hallucinations  . Penicillin V Potassium Other (See Comments)  . Prasugrel Other (See Comments)    weakness  . Promethazine Other (See Comments)  . Sulfa Antibiotics Other (See Comments)  . Torsemide Other (See Comments)  . Tramadol Other (See Comments)  . Zolpidem Other (See Comments)    Medications Prior to Admission  Medication Sig Dispense Refill  . acetaminophen (TYLENOL) 500 MG tablet Take 1  tablet by mouth as needed.    Marland Kitchen albuterol (PROAIR HFA) 108 (90 BASE) MCG/ACT inhaler Inhale 2 puffs into the lungs every 6 (six) hours as needed.    Marland Kitchen aspirin EC 81 MG tablet Take 1 tablet by mouth daily.    . bimatoprost (LUMIGAN) 0.01 % SOLN Apply 1 Dose to eye at bedtime.    . Cholecalciferol (D 2000) 2000 UNITS TABS Take 1 tablet by mouth daily.    Marland Kitchen doxazosin (CARDURA) 8 MG tablet Take 1 tablet by mouth at bedtime.    . Ferrous Sulfate (SLOW FE PO) Take by mouth.    . fluticasone (FLONASE) 50 MCG/ACT nasal spray Place 2 sprays into the nose daily.    Marland Kitchen LORazepam (ATIVAN) 0.5 MG tablet Take 1 tablet by mouth 3 (three) times daily.    . montelukast (SINGULAIR) 10 MG tablet Take 1 tablet by mouth at bedtime.    . Multiple Vitamin (MULTI-VITAMINS) TABS Take 1 tablet by mouth daily.    . nebivolol (BYSTOLIC) 2.5 MG tablet Take 1 tablet by mouth 3 (three) times daily.    Marland Kitchen omeprazole (PRILOSEC) 20 MG capsule Take 1 capsule by mouth daily.    . prednisoLONE acetate (PRED FORTE) 1 % ophthalmic suspension Place 1 drop into the right eye daily.      Results for orders placed or performed during the hospital encounter of 05/02/19 (from the past 48 hour(s))  APTT     Status: None   Collection Time: 05/03/19 12:13 AM  Result Value Ref Range   aPTT 30 24 - 36 seconds    Comment: Performed at [facility], [address] [place], [place] [address]  Comprehensive metabolic panel     Status: Abnormal   Collection Time: 05/03/19 12:13 AM  Result Value Ref Range   Sodium 133 (L) 135 - 145 mmol/L   Potassium 3.7 3.5 - 5.1 mmol/L   Chloride 99 98 - 111 mmol/L   CO2 26 22 - 32 mmol/L   Glucose, Bld 146 (H) 70 - 99 mg/dL   BUN 13 8 - 23 mg/dL   Creatinine, Ser 0.69 0.61 - 1.24 mg/dL   Calcium 8.7 (L) 8.9 - 10.3 mg/dL   Total Protein 6.8 6.5 - 8.1 g/dL   Albumin 4.3 3.5 - 5.0 g/dL   AST 21 15 - 41 U/L   ALT 14 0 - 44 U/L   Alkaline Phosphatase 58 38 - 126 U/L   Total Bilirubin 0.6 0.3 -  1.2 mg/dL   GFR calc non Af Amer >60 >60 mL/min   GFR calc Af Amer >60 >60 mL/min   Anion gap 8 5 - 15    Comment: Performed at [facility], [address] [place], [place] [address]  CBC WITH DIFFERENTIAL     Status: Abnormal   Collection Time: 05/03/19 12:13 AM  Result Value Ref Range   WBC 6.0 4.0 - 10.5 K/uL   RBC 3.58 (  L) 4.22 - 5.81 MIL/uL   Hemoglobin 12.3 (L) 13.0 - 17.0 g/dL   HCT 35.3 (L) 39.0 - 52.0 %   MCV 98.6 80.0 - 100.0 fL   MCH 34.4 (H) 26.0 - 34.0 pg   MCHC 34.8 30.0 - 36.0 g/dL   RDW 12.3 11.5 - 15.5 %   Platelets 161 150 - 400 K/uL   nRBC 0.0 0.0 - 0.2 %   Neutrophils Relative % 74 %   Neutro Abs 4.5 1.7 - 7.7 K/uL   Lymphocytes Relative 13 %   Lymphs Abs 0.8 0.7 - 4.0 K/uL   Monocytes Relative 7 %   Monocytes Absolute 0.4 0.1 - 1.0 K/uL   Eosinophils Relative 4 %   Eosinophils Absolute 0.2 0.0 - 0.5 K/uL   Basophils Relative 1 %   Basophils Absolute 0.1 0.0 - 0.1 K/uL   Immature Granulocytes 1 %   Abs Immature Granulocytes 0.03 0.00 - 0.07 K/uL    Comment: Performed at [facility], [address] [place], [place] [address]  Ethanol     Status: None   Collection Time: 05/03/19 12:13 AM  Result Value Ref Range   Alcohol, Ethyl (B) <10 <10 mg/dL    Comment: (NOTE) Lowest detectable limit for serum alcohol is 10 mg/dL. For medical purposes only. Performed at [facility], [address] [place], [place] [address]   Protime-INR     Status: None   Collection Time: 05/03/19 12:13 AM  Result Value Ref Range   Prothrombin Time 13.1 11.4 - 15.2 seconds   INR 1.0 0.8 - 1.2    Comment: (NOTE) INR goal varies based on device and disease states. Performed at [facility], [address] [place], [place] [address]   Type and screen [place]     Status: None   Collection Time: 05/03/19 12:13 AM  Result Value Ref Range   ABO/RH(D) O NEG    Antibody Screen NEG    Sample Expiration       05/06/2019,2359 Performed at [facility], [place], [place] [address]   Troponin I (High Sensitivity)     Status: None   Collection Time: 05/03/19 12:13 AM  Result Value Ref Range   Troponin I (High Sensitivity) 11 <18 ng/L    Comment: (NOTE) Elevated high sensitivity troponin I (hsTnI) values and significant  changes across serial measurements may suggest ACS but many other  chronic and acute conditions are known to elevate hsTnI results.  Refer to the "Links" section for chest pain algorithms and additional  guidance. Performed at [facility], [address] [place], [place] [address]   SARS Coronavirus 2 (CEPHEID - Performed in [facility] hospital lab), Hosp Order     Status: None   Collection Time: 05/03/19 12:36 AM   Specimen: Nasopharyngeal Swab  Result Value Ref Range   SARS Coronavirus 2 NEGATIVE NEGATIVE    Comment: (NOTE) If result is NEGATIVE SARS-CoV-2 target nucleic acids are NOT DETECTED. The SARS-CoV-2 RNA is generally detectable in upper and lower  respiratory specimens during the acute phase of infection. The lowest  concentration of SARS-CoV-2 viral copies this assay can detect is 250  copies / mL. A negative result does not preclude SARS-CoV-2 infection  and should not be used as the sole basis for treatment or other  patient management decisions.  A negative result may occur with  improper specimen collection / handling, submission of specimen other  than nasopharyngeal swab, presence of viral mutation(s) within the  areas targeted by this assay, and inadequate number of viral copies  (<250 copies / mL). A negative result must be combined with clinical  observations, patient history, and epidemiological information. If result is POSITIVE SARS-CoV-2 target nucleic acids are DETECTED. The SARS-CoV-2 RNA is generally detectable in upper and lower  respiratory specimens dur ing the acute phase of infection.  Positive   results are indicative of active infection with SARS-CoV-2.  Clinical  correlation with patient history and other diagnostic information is  necessary to determine patient infection status.  Positive results do  not rule out bacterial infection or co-infection with other viruses. If result is PRESUMPTIVE POSTIVE SARS-CoV-2 nucleic acids MAY BE PRESENT.   A presumptive positive result was obtained on the submitted specimen  and confirmed on repeat testing.  While 2019 novel coronavirus  (SARS-CoV-2) nucleic acids may be present in the submitted sample  additional confirmatory testing may be necessary for epidemiological  and / or clinical management purposes  to differentiate between  SARS-CoV-2 and other Sarbecovirus currently known to infect humans.  If clinically indicated additional testing with an alternate test  methodology [phone]) is advised. The SARS-CoV-2 RNA is generally  detectable in upper and lower respiratory sp ecimens during the acute  phase of infection. The expected result is Negative. Fact Sheet for Patients:  StrictlyIdeas.no Fact Sheet for Healthcare Providers: BankingDealers.co.za This test is not yet approved or cleared by the [place] FDA and has been authorized for detection and/or diagnosis of SARS-CoV-2 by FDA under an Emergency Use Authorization (EUA).  This EUA will remain in effect (meaning this test can be used) for the duration of the COVID-19 declaration under Section 564(b)(1) of the Act, 21 U.S.C. section 360bbb-3(b)(1), unless the authorization is terminated or revoked sooner. Performed at [facility], [address] [place], [place] [address]   Urine Drug Screen, Qualitative     Status: Abnormal   Collection Time: 05/03/19  2:55 AM  Result Value Ref Range   Tricyclic, Ur Screen NONE DETECTED NONE DETECTED   Amphetamines, Ur Screen NONE DETECTED NONE DETECTED   MDMA (Ecstasy)Ur  Screen NONE DETECTED NONE DETECTED   Cocaine Metabolite,Ur [place] NONE DETECTED NONE DETECTED   Opiate, Ur Screen NONE DETECTED NONE DETECTED   Phencyclidine (PCP) Ur S NONE DETECTED NONE DETECTED   Cannabinoid 50 Ng, Ur [place] NONE DETECTED NONE DETECTED   Barbiturates, Ur Screen NONE DETECTED NONE DETECTED   Benzodiazepine, Ur Scrn POSITIVE (A) NONE DETECTED   Methadone Scn, Ur NONE DETECTED NONE DETECTED    Comment: (NOTE) Tricyclics + metabolites, urine    Cutoff 1000 ng/mL Amphetamines + metabolites, urine  Cutoff 1000 ng/mL MDMA (Ecstasy), urine              Cutoff 500 ng/mL Cocaine Metabolite, urine          Cutoff 300 ng/mL Opiate + metabolites, urine        Cutoff 300 ng/mL Phencyclidine (PCP), urine         Cutoff 25 ng/mL Cannabinoid, urine                 Cutoff 50 ng/mL Barbiturates + metabolites, urine  Cutoff 200 ng/mL Benzodiazepine, urine              Cutoff 200 ng/mL Methadone, urine                   Cutoff 300 ng/mL  The urine drug screen provides only a preliminary, unconfirmed analytical test result and should not be used for non-medical purposes. Clinical consideration and professional judgment should be applied to any positive drug screen result due to possible interfering substances. A more specific alternate chemical method must be used in order to obtain a confirmed analytical result. Gas chromatography / mass spectrometry (GC/MS) is the preferred confirmat ory method. Performed at [facility], [address] [place], [place] [address]   Urinalysis, Routine w reflex microscopic     Status: Abnormal   Collection Time: 05/03/19  2:55 AM  Result Value Ref Range   Color, Urine YELLOW (A) YELLOW   APPearance HAZY (A) CLEAR   Specific Gravity, Urine 1.009 1.005 - 1.030   pH 7.0 5.0 - 8.0   Glucose, UA NEGATIVE NEGATIVE mg/dL   Hgb urine dipstick NEGATIVE NEGATIVE   Bilirubin Urine NEGATIVE NEGATIVE   Ketones, ur NEGATIVE NEGATIVE mg/dL   Protein, ur  NEGATIVE NEGATIVE mg/dL   Nitrite POSITIVE (A) NEGATIVE   Leukocytes,Ua NEGATIVE NEGATIVE   RBC / HPF 0-5 0 - 5 RBC/hpf   WBC, UA 0-5 0 - 5 WBC/hpf   Bacteria, UA RARE (A) NONE SEEN   Squamous Epithelial / LPF 0-5 0 - 5    Comment: Performed at [facility], [address] [place], [place] [address]  Lipid panel     Status: None   Collection Time: 05/03/19  5:06 AM  Result Value Ref Range   Cholesterol 150 0 - 200 mg/dL   Triglycerides 42 <150 mg/dL   HDL 69 >40 mg/dL   Total CHOL/HDL Ratio 2.2 RATIO   VLDL 8 0 - 40 mg/dL   LDL Cholesterol 73 0 - 99 mg/dL    Comment:        Total Cholesterol/HDL:CHD Risk Coronary Heart Disease Risk Table                     Men   Women  1/2 Average Risk   3.4   3.3  Average Risk       5.0   4.4  2 X Average Risk   9.6   7.1  3 X Average Risk  23.4   11.0        Use the calculated Patient Ratio above and the CHD Risk Table to determine the patient's CHD Risk.        ATP III CLASSIFICATION (LDL):  <100     mg/dL   Optimal  100-129  mg/dL   Near or Above                    Optimal  130-159  mg/dL   Borderline  160-189  mg/dL   High  >190     mg/dL   Very High Performed at [address] [place], [place] [address]    Ct Head Code Stroke Wo Contrast  Result Date: 05/03/2019 CLINICAL DATA:  Code stroke. 85 y/o M; headache and dizziness. Focal neuro deficit, < 6 hrs, stroke suspected Altered level of consciousness (LOC), unexplained. EXAM: CT HEAD WITHOUT CONTRAST TECHNIQUE: Contiguous axial images were obtained from the base of the skull through the vertex without intravenous contrast. COMPARISON:  03/12/2015 CT head. FINDINGS: Brain: No evidence of acute infarction, hemorrhage, hydrocephalus, extra-axial collection or mass lesion/mass effect. Stable small chronic infarctions within the right caudate head and right thalamus. Stable small chronic cortical infarction in the right parietal  lobe. Interval very small  chronic cortical infarction in the left parietal lobe. Stable chronic microvascular ischemic changes of white matter and volume loss of the brain. Vascular: Calcific atherosclerosis of the internal carotid arteries. No hyperdense vessel. Skull: Normal. Negative for fracture or focal lesion. Sinuses/Orbits: No acute finding. Other: Right intra-ocular lens replacement. ASPECTS [facility] Stroke Program Early CT Score) - Ganglionic level infarction (caudate, lentiform nuclei, internal capsule, insula, M1-M3 cortex): 7 - Supraganglionic infarction (M4-M6 cortex): 3 Total score (0-10 with 10 being normal): 10 IMPRESSION: 1. No acute intracranial abnormality identified. 2. ASPECTS is 10. 3. New very small chronic cortical infarction in the left parietal lobe. Otherwise stable chronic microvascular ischemic changes, parenchymal volume loss, and small chronic infarctions of the brain. These results were called by telephone at the time of interpretation on 05/03/2019 at 12:32 am to Dr. Hinda Kehr , who verbally acknowledged these results. Electronically Signed   By: Kristine Garbe M.D.   On: 05/03/2019 00:34    Review of Systems  Constitutional: Negative for chills and fever.  HENT: Negative for sore throat and tinnitus.   Eyes: Negative for blurred vision and redness.  Respiratory: Negative for cough and shortness of breath.   Cardiovascular: Negative for chest pain, palpitations, orthopnea and PND.  Gastrointestinal: Negative for abdominal pain, diarrhea, nausea and vomiting.  Genitourinary: Negative for dysuria, frequency and urgency.  Musculoskeletal: Negative for joint pain and myalgias.  Skin: Negative for rash.       No lesions  Neurological: Positive for dizziness, speech change and focal weakness. Negative for weakness.  Endo/Heme/Allergies: Does not bruise/bleed easily.       No temperature intolerance  Psychiatric/Behavioral: Negative for depression and suicidal ideas.    Blood  pressure (!) 161/75, pulse 76, temperature (!) 97.5 F (36.4 C), temperature source Oral, resp. rate 20, height 5' 5.98" (1.676 m), weight 78.5 kg, SpO2 94 %. Physical Exam  Vitals reviewed. Constitutional: He is oriented to person, place, and time. He appears well-developed and well-nourished. No distress.  HENT:  Head: Normocephalic and atraumatic.  Mouth/Throat: Oropharynx is clear and moist.  Eyes: Pupils are equal, round, and reactive to light. Conjunctivae and EOM are normal. No scleral icterus.  Neck: Normal range of motion. Neck supple. No JVD present. No tracheal deviation present. No thyromegaly present.  Cardiovascular: Normal rate, regular rhythm and normal heart sounds. Exam reveals no gallop and no friction rub.  No murmur heard. Respiratory: Effort normal and breath sounds normal. No respiratory distress.  GI: Soft. Bowel sounds are normal. He exhibits no distension. There is no abdominal tenderness.  Genitourinary:    Genitourinary Comments: Deferred   Musculoskeletal: Normal range of motion.        General: No edema.  Lymphadenopathy:    He has no cervical adenopathy.  Neurological: He is alert and oriented to person, place, and time. No cranial nerve deficit.  Skin: Skin is warm and dry. No rash noted. No erythema.  Psychiatric: He has a normal mood and affect. His behavior is normal. Judgment and thought content normal.     Assessment/Plan This is an 85 year old male admitted for TIA. 1.  TIA: The patient has known left carotid artery stenosis.  Repeat ultrasound of carotids.  The patient has a pacemaker, thus he cannot receive an MRI.  Echocardiogram also ordered to evaluate for thrombus.  Consult neurology. 2.  Hypertension: Uncontrolled; permissive hypertension for now.  May resume nebivolol for rhythm control. 3.  Neuropathy: Optic/facial nerve distribution secondary  to history of shingles as well as in his lower extremities.  I do not think that his left arm  numbness can be explained from the same neuropathy.  He has some phantom neglect of that arm but has full range of motion and mildly decreased sensation. 4.  Pacemaker: The patient reports having a scheduled procedure next month to replace or add a pacemaker wire.  Will consult cardiology for further guidance while the patient is hospitalized here. 5.  BPH: Continue Cardura 6.  DVT prophylaxis: Lovenox 7.  GI prophylaxis: Pantoprazole per home regimen The patient is a full code.  Time spent on admission orders and patient care approximately 45 minutes  Harrie Foreman, MD 05/03/2019, 6:21 AM

## 2019-05-03 NOTE — Consult Note (Signed)
[facility] TeleNeurology Consult Services   Date of Service:   05/03/2019 00:35:18  Impression:     .  Rule Out Acute Ischemic Stroke  Comments/Sign-Out: 85 y/o man with h/o HTN, CAD, CHF who presents to the ED with headache. He also says he has shingles in his right eye. Patient says "I had a spell of some kind" and that "my head is hurting." Patient says he has had headache for 4 months. NIHSS 0. Patient to be admitted for r/o stroke work-up.  Metrics: Last Known Well: Unknown [facility] Notification Time: 05/03/2019 00:35:18 Arrival Time: 05/02/2019 23:58:00 Stamp Time: 05/03/2019 00:35:18 Time First Login Attempt: 05/03/2019 00:39:12 Video Start Time: 05/03/2019 00:39:12  Symptoms: headache NIHSS Start Assessment Time: 05/03/2019 00:41:44 Patient is not a candidate for tPA. Patient was not deemed candidate for tPA thrombolytics because of Unknown last known well. NIHSS 0. . Video End Time: 05/03/2019 00:54:40  CT head was reviewed.  Clinical Presentation is not Suggestive of Large Vessel Occlusive Disease  ED Physician notified of diagnostic impression and management plan on 05/03/2019 00:49:06  Our recommendations are outlined below.  Recommendations:     .  Activate Stroke Protocol Admission/Order Set     .  Stroke/Telemetry Floor     .  Neuro Checks     .  Bedside Swallow Eval     .  DVT Prophylaxis     .  IV Fluids, Normal Saline     .  Head of Bed 30 Degrees     .  Euglycemia and Avoid Hyperthermia (PRN Acetaminophen)     .  ASA if no contraindication   Sign Out:     .  Discussed with Emergency Department Provider    ------------------------------------------------------------------------------  History of Present Illness: Patient is a 85 year old Male.  Patient was brought by EMS for symptoms of headache  85 y/o man with h/o HTN, CAD, CHF who presents to the ED with headache. He also says he has shingles in his right eye.  Patient says "I had a spell of some kind" and that "my head is hurting." Patient says he has had headache for 4 months. He also states he had shingles. Patient reportedly was talking to someone on the phone and was apparently confused and then EMS was called. Unknown last known well. Patient has been vomiting. He also complains of leg numbness, but he has neuropathy. CT head reviewed and case discussed with ED attending at the bedside.   Examination: 1A: Level of Consciousness - Alert; keenly responsive + 0 1B: Ask Month and Age - Both Questions Right + 0 1C: Blink Eyes & Squeeze Hands - Performs Both Tasks + 0 2: Test Horizontal Extraocular Movements - Normal + 0 3: Test Visual Fields - No Visual Loss + 0 4: Test Facial Palsy (Use Grimace if Obtunded) - Normal symmetry + 0 5A: Test Left Arm Motor Drift - No Drift for 10 Seconds + 0 5B: Test Right Arm Motor Drift - No Drift for 10 Seconds + 0 6A: Test Left Leg Motor Drift - No Drift for 5 Seconds + 0 6B: Test Right Leg Motor Drift - No Drift for 5 Seconds + 0 7: Test Limb Ataxia (FNF/Heel-Shin) - No Ataxia + 0 8: Test Sensation - Normal; No sensory loss + 0 9: Test Language/Aphasia - Normal; No aphasia + 0 10: Test Dysarthria - Normal + 0 11: Test Extinction/Inattention - No abnormality + 0  NIHSS Score: 0  Patient/Family  was informed the Neurology Consult would happen via TeleHealth consult by way of interactive audio and video telecommunications and consented to receiving care in this manner.  Due to the immediate potential for life-threatening deterioration due to underlying acute neurologic illness, I spent 25 minutes providing critical care. This time includes time for face to face visit via telemedicine, review of medical records, imaging studies and discussion of findings with providers, the patient and/or family.   Dr Meryl Crutch   [facility] [phone]   Case [id number]

## 2019-05-03 NOTE — ED Provider Notes (Signed)
[facility] Emergency Department Provider Note  ____________________________________________   First MD Initiated Contact with Patient 05/03/19 0005     (approximate)  I have reviewed the triage vital signs and the nursing notes.   HISTORY  Chief Complaint Dizziness and Headache  Level 5 caveat:  history/ROS limited by acute/critical illness  HPI Darrell Jacobson is a 85 y.o. male with medical history as listed below which notably  includes CHF and heart disease with an implanted pacemaker (uncertain at this time if it is also a defibrillator) and no prior history of CVA of which she is aware who presents by EMS for evaluation of a variety of symptoms that began acutely approximately 30 to 40 minutes prior to arrival.  Reportedly he was on the phone with a family member when he stopped making sense to the point that the person that was on the phone contacted other family member so that the patient's son can come to the house to check on him.  By the time the son got there the patient was speaking clearly again but he reports that he had acute onset of an inability to control his left arm.  He also reports that he has some numbness or tingling in his leg; first he said the right leg but then he said the left leg.  He believes that it was difficult for him to get his words out for a period of time but he agrees that he is speaking normally now.  He said the symptoms are better than before but he still is having trouble controlling his left arm.  He says he is never had this happen before.  Additionally, he has a mild headache in the front part of his head and he has acute onset nausea and vomiting and was vomiting with the paramedics.  He was able to stand up for the paramedics but then he became acutely vertiginous and had to sit back down and was unable to maintain his balance.  The symptoms are severe although they are rapidly improving except for the nausea and the left  arm weakness as described above.  He has not had any contact with COVID-19 patients and he lives alone although his family checks on him frequently.  He denies fever/chills, difficulty swallowing, sore throat, chest pain, shortness of breath, abdominal pain, and dysuria.        Past Medical History:  Diagnosis Date   Arthritis    Asthma    COPD with inhaler use   Cancer ([place]) 2013   Basal Cell Skin CA resected from scalp.   Carotid stenosis    pateint states he has Left carotid blockage worse than Right.    CHF (congestive heart failure) (HCC)    Collagen vascular disease (HCC)    COPD (chronic obstructive pulmonary disease) (HCC)    Coronary artery disease    Dyspnea    GERD (gastroesophageal reflux disease)    Glaucoma    History of hiatal hernia    History of shingles 2012   forehead   Hypertension    Multilevel degenerative disc disease    Neuropathy    legs and feet   Pacemaker 2006    There are no active problems to display for this patient.   Past Surgical History:  Procedure Laterality Date   BACK SURGERY     2 lumbar, 1 thoracic   CATARACT EXTRACTION W/PHACO Right 12/24/2016   Procedure: CATARACT EXTRACTION PHACO AND INTRAOCULAR LENS PLACEMENT (  IOC)  Right  complicated;  Surgeon: Leandrew Koyanagi, MD;  Location: [place];  Service: Ophthalmology;  Laterality: Right;  [place]   JOINT REPLACEMENT Right    knee   SKIN DEBRIDEMENT  06/13/2006   facial   SKIN GRAFT Right 07/11/2016   facial    Prior to Admission medications   Medication Sig Start Date End Date Taking? Authorizing Provider  acetaminophen (TYLENOL) 500 MG tablet Take 1 tablet by mouth as needed.    [provider]  albuterol (PROAIR HFA) 108 (90 BASE) MCG/ACT inhaler Inhale 2 puffs into the lungs every 6 (six) hours as needed. 01/25/15 12/19/16  [provider]  aspirin EC 81 MG tablet Take 1 tablet by mouth daily.    [provider]   bimatoprost (LUMIGAN) 0.01 % SOLN Apply 1 Dose to eye at bedtime. 01/16/14   [provider]  Cholecalciferol (D 2000) 2000 UNITS TABS Take 1 tablet by mouth daily.    [provider]  doxazosin (CARDURA) 8 MG tablet Take 1 tablet by mouth at bedtime. 06/21/15   [provider]  Ferrous Sulfate (SLOW FE PO) Take by mouth.    [provider]  LORazepam (ATIVAN) 0.5 MG tablet Take 1 tablet by mouth 3 (three) times daily. 09/14/15   [provider]  montelukast (SINGULAIR) 10 MG tablet Take 1 tablet by mouth at bedtime. 01/15/15   [provider]  Multiple Vitamin (MULTI-VITAMINS) TABS Take 1 tablet by mouth daily.    [provider]  nebivolol (BYSTOLIC) 2.5 MG tablet Take 1 tablet by mouth 3 (three) times daily. 06/26/15   [provider]  omeprazole (PRILOSEC) 20 MG capsule Take 1 capsule by mouth daily. 03/19/15   [provider]    Allergies Prednisone, Acyclovir, Albuterol sulfate, Amlodipine, Brinzolamide, Budesonide-formoterol fumarate, Buspirone, Cefdinir, Celecoxib, Chlorthalidone, Clopidogrel, Codeine, Diazepam, Dicyclomine, Duloxetine, Famciclovir, Felodipine, Furosemide, Gabapentin, Hydrochlorothiazide, Indomethacin, Lisinopril, Losartan, Meclizine, Meloxicam, Metaxalone, Metoprolol tartrate, Morphine, Nabumetone, Naproxen, Olmesartan, Oxycodone, Penicillin v potassium, Prasugrel, Promethazine, Sulfa antibiotics, Torsemide, Tramadol, and Zolpidem  No family history on file.  Social History Social History   Tobacco Use   Smoking status: Former Smoker    Quit date: 11/03/1984    Years since quitting: 34.5   Smokeless tobacco: Never Used  Substance Use Topics   Alcohol use: Yes    Alcohol/week: 14.0 standard drinks    Types: 14 Cans of beer per week   Drug use: Not Currently    Review of Systems Constitutional: No fever/chills Eyes: No visual changes. ENT: No sore throat. Cardiovascular: Denies  chest pain. Respiratory: Denies shortness of breath. Gastrointestinal: No abdominal pain.  No nausea, no vomiting.  No diarrhea.  No constipation. Genitourinary: Negative for dysuria. Musculoskeletal: Negative for neck pain.  Negative for back pain. Integumentary: Negative for rash. Neurological: Acute onset vertiginous, nausea/vomiting, frontal headache, left arm weakness and difficulty controlling it, and numbness either in the left or the right leg.   ____________________________________________   PHYSICAL EXAM:  VITAL SIGNS: ED Triage Vitals  Enc Vitals Group     BP 05/03/19 0009 (!) 172/73     Pulse Rate 05/03/19 0009 61     Resp 05/03/19 0009 20     Temp 05/03/19 0009 97.7 F (36.5 C)     Temp Source 05/03/19 0009 Oral     SpO2 05/03/19 0009 98 %     Weight --      Height --      Head Circumference --  Peak Flow --      Pain Score 05/03/19 0010 4     Pain Loc --      Pain Edu? --      Excl. in [place]? --     Constitutional: Alert and oriented.  Appears acutely uncomfortable and is holding an emesis bag with some emesis caked around his mouth. Eyes: Conjunctivae are normal.  Head: Sequela of prior surgery to the front of his scalp but without any acute injuries. Nose: No congestion/rhinnorhea. Mouth/Throat: Mucous membranes are moist. Neck: No stridor.  No meningeal signs.   Cardiovascular: Normal rate, regular paced rhythm. Good peripheral circulation. Grossly normal heart sounds. Respiratory: Normal respiratory effort.  No retractions. No audible wheezing. Gastrointestinal: Soft and nontender. No distention.  The patient has a large abdominal hernia that is chronic and is nontender at this time and easily reducible. Musculoskeletal: No lower extremity tenderness nor edema. No gross deformities of extremities. Skin:  Skin is warm, dry and intact. No rash noted. Psychiatric: Mood and affect are normal. Speech and behavior are normal. Neurologic:  Normal speech and  language without dysarthria or aphasia.  Answers questions appropriately.  He has decreased grip strength in the left hand and is able to raise his left arm to about 45 degrees above horizontal but has a hard time maintaining it.  He can barely lift the left leg off the bed against gravity and it immediately falls down to the bed again.  He has some subjective numbness in the left leg.  See full NIH stroke scale below.   NIH Stroke Scale  Interval: Baseline Time: 12:07 AM Person Administering Scale: Hinda Kehr  Administer stroke scale items in the order listed. Record performance in each category after each subscale exam. Do not go back and change scores. Follow directions provided for each exam technique. Scores should reflect what the patient does, not what the clinician thinks the patient can do. The clinician should record answers while administering the exam and work quickly. Except where indicated, the patient should not be coached (i.e., repeated requests to patient to make a special effort).   1a  Level of consciousness: 0=alert; keenly responsive  1b. LOC questions:  0=Performs both tasks correctly  1c. LOC commands: 0=Performs both tasks correctly  2.  Best Gaze: 0=normal  3.  Visual: 0=No visual loss  4. Facial Palsy: 0=Normal symmetric movement  5a.  Motor left arm: 1=Drift, limb holds 90 (or 45) degrees but drifts down before full 10 seconds: does not hit bed  5b.  Motor right arm: 0=No drift, limb holds 90 (or 45) degrees for full 10 seconds  6a. motor left leg: 2=Some effort against gravity, limb cannot get to or maintain (if cured) 90 (or 45) degrees, drifts down to bed, but has some effort against gravity  6b  Motor right leg:  0=No drift, limb holds 90 (or 45) degrees for full 10 seconds  7. Limb Ataxia: 0=Absent  8.  Sensory: 1=Mild to moderate sensory loss; patient feels pinprick is less sharp or is dull on the affected side; there is a loss of superficial pain with  pinprick but patient is aware He is being touched  9. Best Language:  0=No aphasia, normal  10. Dysarthria: 0=Normal  11. Extinction and Inattention: 0=No abnormality  12. Distal motor function: 0=Normal   Total:   4     ____________________________________________   LABS (all labs ordered are listed, but only abnormal results are displayed)  Labs Reviewed  COMPREHENSIVE METABOLIC PANEL - Abnormal; Notable for the following components:      Result Value   Sodium 133 (*)    Glucose, Bld 146 (*)    Calcium 8.7 (*)    All other components within normal limits  CBC WITH DIFFERENTIAL/PLATELET - Abnormal; Notable for the following components:   RBC 3.58 (*)    Hemoglobin 12.3 (*)    HCT 35.3 (*)    MCH 34.4 (*)    All other components within normal limits  SARS CORONAVIRUS 2 (HOSPITAL ORDER, [facility])  APTT  ETHANOL  PROTIME-INR  TROPONIN I (HIGH SENSITIVITY)  URINE DRUG SCREEN, QUALITATIVE ([facility] ONLY)  URINALYSIS, ROUTINE W REFLEX MICROSCOPIC  TYPE AND SCREEN   ____________________________________________  EKG  ED ECG REPORT I, Hinda Kehr, the attending physician, personally viewed and interpreted this ECG.  Date: 05/03/2019 EKG Time: 00: 38 Rate: 62 Rhythm: paced rhythm QRS Axis: paced rhythm Intervals: abnormal due to pacemaker ST/T Wave abnormalities: Non-specific ST segment / T-wave changes, but no evidence of acute ischemia. Narrative Interpretation: No acute ischemia evidence of paced rhythm   ____________________________________________  RADIOLOGY I, Hinda Kehr, personally discussed these images and results by phone with the on-call radiologist and used this discussion as part of my medical decision making.    ED MD interpretation: Aspects 10, no indication of acute infarction, multiple small old chronic infarctions.  Official radiology report(s): Ct Head Code Stroke Wo Contrast  Result Date: 05/03/2019 CLINICAL DATA:   Code stroke. 85 y/o M; headache and dizziness. Focal neuro deficit, < 6 hrs, stroke suspected Altered level of consciousness (LOC), unexplained. EXAM: CT HEAD WITHOUT CONTRAST TECHNIQUE: Contiguous axial images were obtained from the base of the skull through the vertex without intravenous contrast. COMPARISON:  03/12/2015 CT head. FINDINGS: Brain: No evidence of acute infarction, hemorrhage, hydrocephalus, extra-axial collection or mass lesion/mass effect. Stable small chronic infarctions within the right caudate head and right thalamus. Stable small chronic cortical infarction in the right parietal lobe. Interval very small chronic cortical infarction in the left parietal lobe. Stable chronic microvascular ischemic changes of white matter and volume loss of the brain. Vascular: Calcific atherosclerosis of the internal carotid arteries. No hyperdense vessel. Skull: Normal. Negative for fracture or focal lesion. Sinuses/Orbits: No acute finding. Other: Right intra-ocular lens replacement. ASPECTS [facility] Stroke Program Early CT Score) - Ganglionic level infarction (caudate, lentiform nuclei, internal capsule, insula, M1-M3 cortex): 7 - Supraganglionic infarction (M4-M6 cortex): 3 Total score (0-10 with 10 being normal): 10 IMPRESSION: 1. No acute intracranial abnormality identified. 2. ASPECTS is 10. 3. New very small chronic cortical infarction in the left parietal lobe. Otherwise stable chronic microvascular ischemic changes, parenchymal volume loss, and small chronic infarctions of the brain. These results were called by telephone at the time of interpretation on 05/03/2019 at 12:32 am to Dr. Hinda Kehr , who verbally acknowledged these results. Electronically Signed   By: Kristine Garbe M.D.   On: 05/03/2019 00:34    ____________________________________________   PROCEDURES   Procedure(s) performed (including Critical Care):  .Critical Care Performed by: Hinda Kehr, MD Authorized by:  Hinda Kehr, MD   Critical care provider statement:    Critical care time (minutes):  30   Critical care time was exclusive of:  Separately billable procedures and treating other patients   Critical care was necessary to treat or prevent imminent or life-threatening deterioration of the following conditions:  CNS failure or compromise   Critical care was  time spent personally by me on the following activities:  Development of treatment plan with patient or surrogate, discussions with consultants, evaluation of patient's response to treatment, examination of patient, obtaining history from patient or surrogate, ordering and performing treatments and interventions, ordering and review of laboratory studies, ordering and review of radiographic studies, pulse oximetry, re-evaluation of patient's condition and review of old charts      ____________________________________________   [place] / MDM / [place] / ED COURSE  As part of my medical decision making, I reviewed the following data within the [place] notes reviewed and incorporated, Labs reviewed , EKG interpreted , Old chart reviewed, Discussed with neurologist, Discussed with admitting physician , A consult was requested and obtained from this/these consultant(s) Neurology and Notes from prior ED visits      *Darrell Jacobson was evaluated in Emergency Department on 05/03/2019 for the symptoms described in the history of present illness. He was evaluated in the context of the global COVID-19 pandemic, which necessitated consideration that the patient might be at risk for infection with the SARS-CoV-2 virus that causes COVID-19. Institutional protocols and algorithms that pertain to the evaluation of patients at risk for COVID-19 are in a state of rapid change based on information released by regulatory bodies including the CDC and federal and state organizations. These policies and algorithms  were followed during the patient's care in the ED.  Some ED evaluations and interventions may be delayed as a result of limited staffing during the pandemic.*  Differential diagnosis includes, but is not limited to, CVA/TIA, acute intracranial hemorrhage, acute infection including but not limited to [id number], complication of his pacemaker, ACS.  The patient appears uncomfortable due to the nausea and vomiting.  He has no immediately apparent gross neurological deficits, but on physical exam he has some left sided neurological deficits as described above with NIH stroke scale of 4 on my assessment.  Although the symptoms do seem to be rapidly improving, given the acute onset, the recent onset, his age and comorbidities, etc., I have made him a code stroke.  In my opinion he is not a candidate for TPA given the relatively mild symptoms, his age and comorbidities, and his rapid improvement of symptoms from what they apparently were at home, but I do think that having neurology evaluate him as soon as possible as well as facilitating the work-up including the imaging is important.  I have made ED staff including the secretary aware of the code stroke status and put in the code stroke order set.  He will be going for an emergent head CT and will get the normal work-up including a rapid COVID-19 test.  Clinical Course as of May 02 208  Tue May 03, 2019  0034 Discussed case by phone with Dr. Toney Reil from radiology.  No evidence of acute stroke on non-con head CT, ASPECTS 10, multiple old chronic infarctions.    Patient is starting teleneurology consult at this time.   [CF]  0103 I spoke over the video conferencing system with Dr. Oleta Mouse the teleneurologist.  By the time he evaluated the patient, the patient had a normal neurological exam with NIH stroke scale of 0.  He recommended admission for TIA/CVA work-up but agreed the patient was not a TPA candidate.  Given that the neurologic exam has improved even since  the time I assessed him, I think no additional treatment right now is indicated other than Zofran 4 mg IV  for his nausea.  I started to order a full dose aspirin but the patient has numerous allergies listed including allergies to multiple NSAIDs.  I will defer to the neurologist as to the risks versus benefits of giving the patient aspirin.  Given his cardiac history, even though he is not having chest pain or shortness of breath, I am going to add on a high-sensitivity troponin.  He was started feel better but now he is again vomiting.  He has not yet gotten the Zofran 4 mg IV that I just ordered.   [CF]  0105 I have sent a secure chat message to Dr. Jannifer Franklin and Dr. Marcille Blanco with the hospitalist service for admission.   [CF]  0109 Comprehensive metabolic panel is reassuring with no acute abnormalities, coag studies are normal, CBC is normal with no leukocytosis and an essentially normal hemoglobin.   [CF]  0110 Dr. Jannifer Franklin with the hospitalist service acknowledged the admission.   [CF]    Clinical Course User Index [CF] Hinda Kehr, MD     ____________________________________________  FINAL CLINICAL IMPRESSION(S) / ED DIAGNOSES  Final diagnoses:  TIA (transient ischemic attack)  Nausea and vomiting, intractability of vomiting not specified, unspecified vomiting type  Acute nonintractable headache, unspecified headache type     MEDICATIONS GIVEN DURING THIS VISIT:  Medications  ondansetron (ZOFRAN) injection 4 mg (4 mg Intravenous Given 05/03/19 0111)     ED Discharge Orders    None       Note:  This document was prepared using Dragon voice recognition software and may include unintentional dictation errors.   Hinda Kehr, MD 05/03/19 [phone]

## 2019-05-03 NOTE — ED Notes (Signed)
Report off to butch rn  

## 2019-05-03 NOTE — NC FL2 (Signed)
[place] LEVEL OF CARE SCREENING TOOL     IDENTIFICATION  Patient Name: Darrell Jacobson Birthdate: Jan 09, 1928 Sex: male Admission Date (Current Location): 05/02/2019  [place] and [place] Number:  Engineering geologist and Address:  [facility], [address] [place], [place] [address]      Provider Number: [id number]  Attending Physician Name and Address:  Hillary Bow, MD  Relative Name and Phone Number:       Current Level of Care: Hospital Recommended Level of Care: [place] Prior Approval Number:    Date Approved/Denied:   PASRR Number: [id number]  Discharge Plan: SNF    Current Diagnoses: Patient Active Problem List   Diagnosis Date Noted  . TIA (transient ischemic attack) 05/03/2019    Orientation RESPIRATION BLADDER Height & Weight     Self, Time, Situation, Place  Normal Continent Weight: 173 lb 1 oz (78.5 kg) Height:  5' 5.98" (167.6 cm)  BEHAVIORAL SYMPTOMS/MOOD NEUROLOGICAL BOWEL NUTRITION STATUS      Continent Diet(Diet: Heart Healthy)  AMBULATORY STATUS COMMUNICATION OF NEEDS Skin   Extensive Assist Verbally Normal                       Personal Care Assistance Level of Assistance  Bathing, Feeding, Dressing Bathing Assistance: Limited assistance Feeding assistance: Independent Dressing Assistance: Limited assistance     Functional Limitations Info  Sight, Hearing, Speech Sight Info: Adequate Hearing Info: Adequate Speech Info: Adequate    SPECIAL CARE FACTORS FREQUENCY  PT (By licensed PT), OT (By licensed OT)     PT Frequency: 5 OT Frequency: 5            Contractures      Additional Factors Info  Code Status, Allergies Code Status Info: Full Code. Allergies Info: Prednisone, Acyclovir, Albuterol Sulfate, Amlodipine, Brinzolamide, Budesonide-formoterol, Fumarate, Buspirone, Cefdinir, Celecoxib, Chlorthalidone, Clopidogrel, Codeine, Diazepam,Dicyclomine,  Duloxetine, Famciclovir, Felodipine, Furosemide, Gabapentin, Hydrochlorothiazide, Indomethacin, Lisinopril, Losartan, Meclizine,Meloxicam, Metaxalone, Metoprolol Tartrate,Morphine, Nabumetone, Naproxen, Olmesartan,           Current Medications (05/03/2019):  This is the current hospital active medication list Current Facility-Administered Medications  Medication Dose Route Frequency Provider Last Rate Last Dose  . acetaminophen (TYLENOL) tablet 650 mg  650 mg Oral Q4H PRN Harrie Foreman, MD   650 mg at 05/03/19 1627   Or  . acetaminophen (TYLENOL) solution 650 mg  650 mg Per Tube Q4H PRN Harrie Foreman, MD       Or  . acetaminophen (TYLENOL) suppository 650 mg  650 mg Rectal Q4H PRN Harrie Foreman, MD      . albuterol (PROVENTIL) (2.5 MG/3ML) 0.083% nebulizer solution 2.5 mg  2.5 mg Nebulization Q4H PRN Harrie Foreman, MD   2.5 mg at 05/03/19 1627  . aspirin EC tablet 81 mg  81 mg Oral Daily Harrie Foreman, MD   81 mg at 05/03/19 1130  . cholecalciferol (VITAMIN D3) tablet 1,000 Units  1,000 Units Oral Daily Harrie Foreman, MD      . doxazosin (CARDURA) tablet 8 mg  8 mg Oral QHS Harrie Foreman, MD      . enoxaparin (LOVENOX) injection 40 mg  40 mg Subcutaneous Q24H Harrie Foreman, MD   40 mg at 05/03/19 0453  . ferrous sulfate tablet 325 mg  325 mg Oral Q breakfast Harrie Foreman, MD      . fluticasone [facility]) 50 MCG/ACT nasal spray 2  spray  2 spray Each Nare Daily Harrie Foreman, MD      . latanoprost (XALATAN) 0.005 % ophthalmic solution 1 drop  1 drop Both Eyes QHS Harrie Foreman, MD      . LORazepam (ATIVAN) tablet 0.5 mg  0.5 mg Oral TID Harrie Foreman, MD   0.5 mg at 05/03/19 1627  . metoCLOPramide (REGLAN) injection 5 mg  5 mg Intravenous Q6H PRN Hillary Bow, MD   5 mg at 05/03/19 1628  . montelukast (SINGULAIR) tablet 10 mg  10 mg Oral QHS Harrie Foreman, MD      . multivitamin with minerals tablet 1 tablet  1 tablet Oral  Daily Harrie Foreman, MD      . nebivolol (BYSTOLIC) tablet 2.5 mg  2.5 mg Oral TID Harrie Foreman, MD      . ondansetron [facility]) injection 4 mg  4 mg Intravenous Q6H PRN Harrie Foreman, MD   4 mg at 05/03/19 1130  . pantoprazole (PROTONIX) EC tablet 40 mg  40 mg Oral Daily Harrie Foreman, MD      . prednisoLONE acetate (PRED FORTE) 1 % ophthalmic suspension 1 drop  1 drop Right Eye Daily Harrie Foreman, MD      . senna-docusate (Senokot-S) tablet 1 tablet  1 tablet Oral QHS PRN Harrie Foreman, MD         Discharge Medications: Please see discharge summary for a list of discharge medications.  Relevant Imaging Results:  Relevant Lab Results:   Additional Information SSN: [id number]  Darrell Jacobson, Darrell Beets, LCSW

## 2019-05-03 NOTE — ED Triage Notes (Signed)
Pt to ED vis [facility] from home. PT was on the phone with a family member when he began not making sense. Family states pt was c/o of headache. PT having dizziness upon standing.

## 2019-05-03 NOTE — Progress Notes (Signed)
   05/03/19 0011  Clinical Encounter Type  Visited With Patient not available;Health care provider  Visit Type Code  Referral From Nurse  Advance Directives (For Healthcare)  Does Patient Have a Medical Advance Directive? No  Mental Health Advance Directives  Does Patient Have a Mental Health Advance Directive? No   Code Stroke page received for the patient. Upon arrival, the patient was away for testing. Patient arrived back to the room and was observed to be awake. While the medical team made assessment, the chaplain maintained pastoral presence outside the patient's room.

## 2019-05-03 NOTE — ED Notes (Signed)
Pt alert.  Speech clear.  Pt has dizziness.  No vomtiing.  Iv in place.  Pt waiting on admission.

## 2019-05-03 NOTE — ED Notes (Signed)
Tele neuro called at 0033.

## 2019-05-03 NOTE — ED Notes (Signed)
Pt has dizziness.  No vomiting at this time.

## 2019-05-03 NOTE — ED Notes (Signed)
Pt alert and talking.

## 2019-05-03 NOTE — ED Notes (Signed)
ED TO INPATIENT HANDOFF REPORT  ED Nurse Name and Phone #: Daiva Nakayama RN [phone]  S Name/Age/Gender Darrell Jacobson 85 y.o. male Room/Bed: ED05A/ED05A  Code Status   Code Status: Not on file  Home/SNF/Other Home Patient oriented to: self, place, time and situation Is this baseline? Yes   Triage Complete: Triage complete  Chief Complaint Headache  Triage Note Pt to ED vis Ems from home. PT was on the phone with a family member when he began not making sense. Family states pt was c/o of headache. PT having dizziness upon standing.    Allergies Allergies  Allergen Reactions  . Prednisone Anxiety and Other (See Comments)  . Acyclovir Other (See Comments)  . Albuterol Sulfate Other (See Comments)  . Amlodipine Other (See Comments)  . Brinzolamide Other (See Comments)  . Budesonide-Formoterol Fumarate Other (See Comments)  . Buspirone Other (See Comments)  . Cefdinir Other (See Comments)  . Celecoxib Other (See Comments)  . Chlorthalidone Other (See Comments)  . Clopidogrel Nausea Only  . Codeine Nausea And Vomiting  . Diazepam Other (See Comments)  . Dicyclomine Other (See Comments)  . Duloxetine Other (See Comments)  . Famciclovir Other (See Comments)  . Felodipine Other (See Comments)  . Furosemide Other (See Comments)  . Gabapentin Swelling  . Hydrochlorothiazide Other (See Comments)  . Indomethacin Other (See Comments)  . Lisinopril Other (See Comments)  . Losartan Other (See Comments)  . Meclizine Other (See Comments)  . Meloxicam Other (See Comments)  . Metaxalone Other (See Comments)  . Metoprolol Tartrate Other (See Comments)  . Morphine Other (See Comments)  . Nabumetone Other (See Comments)  . Naproxen Other (See Comments)  . Olmesartan Other (See Comments)  . Oxycodone     hallucinations  . Penicillin V Potassium Other (See Comments)  . Prasugrel Other (See Comments)    weakness  . Promethazine Other (See Comments)  . Sulfa Antibiotics Other (See  Comments)  . Torsemide Other (See Comments)  . Tramadol Other (See Comments)  . Zolpidem Other (See Comments)    Level of Care/Admitting Diagnosis ED Disposition    ED Disposition Condition St.  Hospital Area: Between [100120]  Level of Care: Med-Surg [16]  Covid Evaluation: Screening Protocol (No Symptoms)  Diagnosis: TIA (transient ischemic attack) [469629]  Admitting Physician: Harrie Foreman [[id number]]  Attending Physician: Harrie Foreman [[id number]]  Bed request comments: 1C  PT Class (Do Not Modify): Observation [104]  PT Acc Code (Do Not Modify): Observation [10022]       B Medical/Surgery History Past Medical History:  Diagnosis Date  . Arthritis   . Asthma    COPD with inhaler use  . Cancer [facility]) 2013   Basal Cell Skin CA resected from scalp.  . Carotid stenosis    pateint states he has Left carotid blockage worse than Right.   . CHF (congestive heart failure) ([place])   . Collagen vascular disease ([place])   . COPD (chronic obstructive pulmonary disease) ([place])   . Coronary artery disease   . Dyspnea   . GERD (gastroesophageal reflux disease)   . Glaucoma   . History of hiatal hernia   . History of shingles 2012   forehead  . Hypertension   . Multilevel degenerative disc disease   . Neuropathy    legs and feet  . Pacemaker 2006   Past Surgical History:  Procedure Laterality Date  . BACK SURGERY     2 lumbar,  1 thoracic  . CATARACT EXTRACTION W/PHACO Right 12/24/2016   Procedure: CATARACT EXTRACTION PHACO AND INTRAOCULAR LENS PLACEMENT ([place])  Right  complicated;  Surgeon: Leandrew Koyanagi, MD;  Location: [place];  Service: Ophthalmology;  Laterality: Right;  [place]  . JOINT REPLACEMENT Right    knee  . SKIN DEBRIDEMENT  06/13/2006   facial  . SKIN GRAFT Right 07/11/2016   facial     A IV Location/Drains/Wounds Patient Lines/Drains/Airways Status   Active Line/Drains/Airways    Name:    Placement date:   Placement time:   Site:   Days:   Peripheral IV 05/03/19 Right Antecubital   05/03/19    0030    Antecubital   less than 1   Incision (Closed) 12/24/16 Eye Right   12/24/16    1011     860          Intake/Output Last 24 hours No intake or output data in the 24 hours ending 05/03/19 0175  Labs/Imaging Results for orders placed or performed during the hospital encounter of 05/02/19 (from the past 48 hour(s))  APTT     Status: None   Collection Time: 05/03/19 12:13 AM  Result Value Ref Range   aPTT 30 24 - 36 seconds    Comment: Performed at [facility], [address] [place], [place] [address]  Comprehensive metabolic panel     Status: Abnormal   Collection Time: 05/03/19 12:13 AM  Result Value Ref Range   Sodium 133 (L) 135 - 145 mmol/L   Potassium 3.7 3.5 - 5.1 mmol/L   Chloride 99 98 - 111 mmol/L   CO2 26 22 - 32 mmol/L   Glucose, Bld 146 (H) 70 - 99 mg/dL   BUN 13 8 - 23 mg/dL   Creatinine, Ser 0.69 0.61 - 1.24 mg/dL   Calcium 8.7 (L) 8.9 - 10.3 mg/dL   Total Protein 6.8 6.5 - 8.1 g/dL   Albumin 4.3 3.5 - 5.0 g/dL   AST 21 15 - 41 U/L   ALT 14 0 - 44 U/L   Alkaline Phosphatase 58 38 - 126 U/L   Total Bilirubin 0.6 0.3 - 1.2 mg/dL   GFR calc non Af Amer >60 >60 mL/min   GFR calc Af Amer >60 >60 mL/min   Anion gap 8 5 - 15    Comment: Performed at [facility], [address] [place], [place] [address]  CBC WITH DIFFERENTIAL     Status: Abnormal   Collection Time: 05/03/19 12:13 AM  Result Value Ref Range   WBC 6.0 4.0 - 10.5 K/uL   RBC 3.58 (L) 4.22 - 5.81 MIL/uL   Hemoglobin 12.3 (L) 13.0 - 17.0 g/dL   HCT 35.3 (L) 39.0 - 52.0 %   MCV 98.6 80.0 - 100.0 fL   MCH 34.4 (H) 26.0 - 34.0 pg   MCHC 34.8 30.0 - 36.0 g/dL   RDW 12.3 11.5 - 15.5 %   Platelets 161 150 - 400 K/uL   nRBC 0.0 0.0 - 0.2 %   Neutrophils Relative % 74 %   Neutro Abs 4.5 1.7 - 7.7 K/uL   Lymphocytes Relative 13 %   Lymphs Abs 0.8 0.7 - 4.0 K/uL    Monocytes Relative 7 %   Monocytes Absolute 0.4 0.1 - 1.0 K/uL   Eosinophils Relative 4 %   Eosinophils Absolute 0.2 0.0 - 0.5 K/uL   Basophils Relative 1 %   Basophils Absolute 0.1 0.0 - 0.1 K/uL  Immature Granulocytes 1 %   Abs Immature Granulocytes 0.03 0.00 - 0.07 K/uL    Comment: Performed at [facility], [address] [place], [place] [address]  Ethanol     Status: None   Collection Time: 05/03/19 12:13 AM  Result Value Ref Range   Alcohol, Ethyl (B) <10 <10 mg/dL    Comment: (NOTE) Lowest detectable limit for serum alcohol is 10 mg/dL. For medical purposes only. Performed at [facility], [address] [place], [place] [address]   Protime-INR     Status: None   Collection Time: 05/03/19 12:13 AM  Result Value Ref Range   Prothrombin Time 13.1 11.4 - 15.2 seconds   INR 1.0 0.8 - 1.2    Comment: (NOTE) INR goal varies based on device and disease states. Performed at [facility], [address] [place], [place] [address]   Type and screen [place]     Status: None   Collection Time: 05/03/19 12:13 AM  Result Value Ref Range   ABO/RH(D) O NEG    Antibody Screen NEG    Sample Expiration      05/06/2019,2359 Performed at [facility], [place], [place] [address]   Troponin I (High Sensitivity)     Status: None   Collection Time: 05/03/19 12:13 AM  Result Value Ref Range   Troponin I (High Sensitivity) 11 <18 ng/L    Comment: (NOTE) Elevated high sensitivity troponin I (hsTnI) values and significant  changes across serial measurements may suggest ACS but many other  chronic and acute conditions are known to elevate hsTnI results.  Refer to the "Links" section for chest pain algorithms and additional  guidance. Performed at [facility], [address] [place], [place] [address]   SARS Coronavirus 2 (CEPHEID - Performed in [facility] hospital lab), Hosp Order      Status: None   Collection Time: 05/03/19 12:36 AM   Specimen: Nasopharyngeal Swab  Result Value Ref Range   SARS Coronavirus 2 NEGATIVE NEGATIVE    Comment: (NOTE) If result is NEGATIVE SARS-CoV-2 target nucleic acids are NOT DETECTED. The SARS-CoV-2 RNA is generally detectable in upper and lower  respiratory specimens during the acute phase of infection. The lowest  concentration of SARS-CoV-2 viral copies this assay can detect is 250  copies / mL. A negative result does not preclude SARS-CoV-2 infection  and should not be used as the sole basis for treatment or other  patient management decisions.  A negative result may occur with  improper specimen collection / handling, submission of specimen other  than nasopharyngeal swab, presence of viral mutation(s) within the  areas targeted by this assay, and inadequate number of viral copies  (<250 copies / mL). A negative result must be combined with clinical  observations, patient history, and epidemiological information. If result is POSITIVE SARS-CoV-2 target nucleic acids are DETECTED. The SARS-CoV-2 RNA is generally detectable in upper and lower  respiratory specimens dur ing the acute phase of infection.  Positive  results are indicative of active infection with SARS-CoV-2.  Clinical  correlation with patient history and other diagnostic information is  necessary to determine patient infection status.  Positive results do  not rule out bacterial infection or co-infection with other viruses. If result is PRESUMPTIVE POSTIVE SARS-CoV-2 nucleic acids MAY BE PRESENT.   A presumptive positive result was obtained on the submitted specimen  and confirmed on repeat testing.  While 2019 novel coronavirus  (  SARS-CoV-2) nucleic acids may be present in the submitted sample  additional confirmatory testing may be necessary for epidemiological  and / or clinical management purposes  to differentiate between  SARS-CoV-2 and other Sarbecovirus  currently known to infect humans.  If clinically indicated additional testing with an alternate test  methodology [phone]) is advised. The SARS-CoV-2 RNA is generally  detectable in upper and lower respiratory sp ecimens during the acute  phase of infection. The expected result is Negative. Fact Sheet for Patients:  StrictlyIdeas.no Fact Sheet for Healthcare Providers: BankingDealers.co.za This test is not yet approved or cleared by the [place] FDA and has been authorized for detection and/or diagnosis of SARS-CoV-2 by FDA under an Emergency Use Authorization (EUA).  This EUA will remain in effect (meaning this test can be used) for the duration of the COVID-19 declaration under Section 564(b)(1) of the Act, 21 U.S.C. section 360bbb-3(b)(1), unless the authorization is terminated or revoked sooner. Performed at [facility], [address] [place], [place] [address]    Ct Head Code Stroke Wo Contrast  Result Date: 05/03/2019 CLINICAL DATA:  Code stroke. 85 y/o M; headache and dizziness. Focal neuro deficit, < 6 hrs, stroke suspected Altered level of consciousness (LOC), unexplained. EXAM: CT HEAD WITHOUT CONTRAST TECHNIQUE: Contiguous axial images were obtained from the base of the skull through the vertex without intravenous contrast. COMPARISON:  03/12/2015 CT head. FINDINGS: Brain: No evidence of acute infarction, hemorrhage, hydrocephalus, extra-axial collection or mass lesion/mass effect. Stable small chronic infarctions within the right caudate head and right thalamus. Stable small chronic cortical infarction in the right parietal lobe. Interval very small chronic cortical infarction in the left parietal lobe. Stable chronic microvascular ischemic changes of white matter and volume loss of the brain. Vascular: Calcific atherosclerosis of the internal carotid arteries. No hyperdense vessel. Skull: Normal. Negative for  fracture or focal lesion. Sinuses/Orbits: No acute finding. Other: Right intra-ocular lens replacement. ASPECTS [facility] Stroke Program Early CT Score) - Ganglionic level infarction (caudate, lentiform nuclei, internal capsule, insula, M1-M3 cortex): 7 - Supraganglionic infarction (M4-M6 cortex): 3 Total score (0-10 with 10 being normal): 10 IMPRESSION: 1. No acute intracranial abnormality identified. 2. ASPECTS is 10. 3. New very small chronic cortical infarction in the left parietal lobe. Otherwise stable chronic microvascular ischemic changes, parenchymal volume loss, and small chronic infarctions of the brain. These results were called by telephone at the time of interpretation on 05/03/2019 at 12:32 am to Dr. Hinda Kehr , who verbally acknowledged these results. Electronically Signed   By: Kristine Garbe M.D.   On: 05/03/2019 00:34    Pending Labs Unresulted Labs (From admission, onward)    Start     Ordered   05/03/19 0007  Urine Drug Screen, Qualitative  Add-on,   AD     05/03/19 0007   05/03/19 0007  Urinalysis, Routine w reflex microscopic  ONCE - STAT,   STAT     05/03/19 0007   Signed and Held  Hemoglobin A1c  Tomorrow morning,   R     Signed and Held   Signed and Held  Lipid panel  Tomorrow morning,   R    Comments: Fasting    Signed and Held   Signed and Held  Creatinine, serum  (enoxaparin (LOVENOX)    CrCl >/= 30 ml/min)  Weekly,   R    Comments: while on enoxaparin therapy    Signed and Held          Vitals/Pain  Today's Vitals   05/03/19 0130 05/03/19 0202 05/03/19 0230 05/03/19 0306  BP: (!) 152/136 (!) 160/68 (!) 161/66 (!) 153/72  Pulse: 60 64 60 62  Resp:    17  Temp:      TempSrc:      SpO2: 96% 96% 96% 96%  PainSc:        Isolation Precautions No active isolations  Medications Medications  ondansetron (ZOFRAN) injection 4 mg (4 mg Intravenous Given 05/03/19 0111)    Mobility walks with person assist Low fall risk   Focused  Assessments    R Recommendations: See Admitting Provider Note  Report given to:   Additional Notes: None

## 2019-05-03 NOTE — ED Notes (Signed)
Pt brought in via ems from home with dizziness and headache.  Pt vomiting on arrival.  Sx started tonight while on the phone. md at bedside on arrival to treatment room.  Iv started and labs sent.  Pt alert and oriented on arrival.

## 2019-05-03 NOTE — Evaluation (Addendum)
Physical Therapy Evaluation Patient Details Name: Darrell Jacobson MRN: [id number] DOB: 29-Dec-1927 Today's Date: 05/03/2019   History of Present Illness  Darrell Jacobson is an 62yoM who comes to [facility] after severe worseing of dizziness, along with slurred speech noted by friend over the phone. PMH: PPM, ICM pending PPM update. PTA pt lived at home alone, AMB without device, DTR has assisted with groceries during the Covid 19 pandemic. Pt cannot have MRI 2/2 PPM, but head CT shows chronic Left parietal lobe infarct.  Clinical Impression  Pt admitted with above diagnosis. Pt currently with functional limitations due to the deficits listed below (see "PT Problem List"). Upon entry, pt in bed, awake but drowsy and agreeable to participate although his confidence is low. The pt is alert and oriented x3, pleasant, conversational, and generally a good historian. ModA for bed mobility and intermittent minA to remain sitting at EOB. Pt stands with modA but is unable to take steps with much facility at EOB. Pt has worsening dizziness and nausea once sitting and is eventually returned to supine. No noted nystagmus. Functional mobility assessment demonstrates increased effort/time requirements, poor tolerance, and need for physical assistance, whereas the patient performed these at a higher level of independence PTA. Pt will benefit from skilled PT intervention to increase independence and safety with basic mobility in preparation for discharge to the venue listed below.       Follow Up Recommendations SNF;Supervision for mobility/OOB;Supervision/Assistance - 24 hour    Equipment Recommendations  None recommended by PT    Recommendations for Other Services       Precautions / Restrictions Precautions Precautions: Fall Restrictions Weight Bearing Restrictions: No      Mobility  Bed Mobility Overal bed mobility: Needs Assistance Bed Mobility: Supine to Sit;Sit to Supine     Supine to sit: Mod  assist Sit to supine: Mod assist   General bed mobility comments: intermittent minA for seated stability at EOB. dizziness worsening and nausea worsening.  Transfers Overall transfer level: Needs assistance Equipment used: 1 person hand held assist Transfers: Sit to/from Stand Sit to Stand: Mod assist         General transfer comment: 2-hand hold to rise from EOB with heavy pull assist, pt unsteady adn quite weak  Ambulation/Gait Ambulation/Gait assistance: Mod assist Gait Distance (Feet): 2 Feet Assistive device: 1 person hand held assist       General Gait Details: attempting some lateral steps at EOB, pthas poor weight acceptance on RLE for LLE stepping, very unsteady, increasingly weak and dizzy; pt returned to supine quickly.  Stairs            Wheelchair Mobility    Modified Rankin (Stroke Patients Only)       Balance Overall balance assessment: Needs assistance Sitting-balance support: Bilateral upper extremity supported;Feet supported Sitting balance-Leahy Scale: Poor     Standing balance support: Bilateral upper extremity supported;During functional activity Standing balance-Leahy Scale: Poor                               Pertinent Vitals/Pain Pain Assessment: No/denies pain    Home Living Family/patient expects to be discharged to:: Private residence Living Arrangements: Alone Available Help at Discharge: Family;Available PRN/intermittently(DTR, SOn) Type of Home: House Home Access: Stairs to enter Entrance Stairs-Rails: Right Entrance Stairs-Number of Steps: 2 Home Layout: One level Home Equipment: Walker - 2 wheels      Prior Function Level of Independence:  Independent               Hand Dominance        Extremity/Trunk Assessment   Upper Extremity Assessment Upper Extremity Assessment: Generalized weakness    Lower Extremity Assessment Lower Extremity Assessment: Generalized weakness       Communication       Cognition Arousal/Alertness: Awake/alert Behavior During Therapy: WFL for tasks assessed/performed Overall Cognitive Status: Within Functional Limits for tasks assessed                                        General Comments      Exercises     Assessment/Plan    PT Assessment Patient needs continued PT services  PT Problem List Decreased strength;Decreased activity tolerance;Decreased balance;Decreased mobility;Decreased coordination;Decreased knowledge of use of DME       PT Treatment Interventions DME instruction;Stair training;Functional mobility training;Therapeutic activities;Therapeutic exercise;Gait training;Balance training;Patient/family education    PT Goals (Current goals can be found in the Care Plan section)  Acute Rehab PT Goals Patient Stated Goal: recover function and AMB capacity PT Goal Formulation: With patient Time For Goal Achievement: 05/17/19 Potential to Achieve Goals: Fair    Frequency 7X/week   Barriers to discharge Inaccessible home environment;Decreased caregiver support lives alone, will need extensive DME    Co-evaluation               AM-PAC PT "6 Clicks" Mobility  Outcome Measure Help needed turning from your back to your side while in a flat bed without using bedrails?: A Lot Help needed moving from lying on your back to sitting on the side of a flat bed without using bedrails?: A Lot Help needed moving to and from a bed to a chair (including a wheelchair)?: A Lot Help needed standing up from a chair using your arms (e.g., wheelchair or bedside chair)?: A Lot Help needed to walk in hospital room?: Total Help needed climbing 3-5 steps with a railing? : Total 6 Click Score: 10    End of Session   Activity Tolerance: Patient limited by lethargy;Patient limited by fatigue;Other (comment);No increased pain(worsening dizziness, nausea.) Patient left: in bed Nurse Communication: Mobility status PT Visit  Diagnosis: Unsteadiness on feet (R26.81);Dizziness and giddiness (R42);Muscle weakness (generalized) (M62.81);Other abnormalities of gait and mobility (R26.89);Difficulty in walking, not elsewhere classified (R26.2);Other symptoms and signs involving the nervous system (R29.898)    Time: 8280-0349 PT Time Calculation (min) (ACUTE ONLY): 21 min   Charges:   PT Evaluation $PT Eval Moderate Complexity: 1 Mod          2:09 PM, 05/03/19 Etta Grandchild, PT, DPT Physical Therapist - [facility]  [phone] ([place])    Lewis C 05/03/2019, 2:09 PM

## 2019-05-03 NOTE — Progress Notes (Signed)
Occupational Therapy Evaluation Patient Details Name: Darrell Jacobson MRN: [id number] DOB: October 20, 1928 Today's Date: 05/03/2019    History of Present Illness Darrell Jacobson is an 85y.o. who was admitted to [facility] with severe worseing of dizziness, and slurred speech. Imaging revealed through CT chronic Left parietal lobe infarct. PMH: PPM, ICM pending PPM update.   Clinical Impression   Pt. presents with lethargy, weakness, dizziness, limited activity tolerance, and limited functional mobility which limits the ability to complete basic ADL and IADL functioning. Pt. resides at home alone. Pt. reports being independent with ADLs, and IADL functioning: including meal preparation, and medication management. Pt. was able to drive. Pt. reports that his daughter assists with groceries. Pt. with lethargy, and dizziness. Pt. SO2 97 HR 60. Pt. could benefit from OT services for ADL training, A/E training, UE neuromuscular re-education, and pt. education about home modification, and DME. Pt. would benefit from SNF level of care upon discharge. Pt. Could benefit from follow-up OT services at discharge.    Follow Up Recommendations  SNF    Equipment Recommendations       Recommendations for Other Services       Precautions / Restrictions Precautions Precautions: ICD/Pacemaker;Fall Restrictions Weight Bearing Restrictions: No      Mobility Bed Mobility Overal bed mobility: Needs Assistance Bed Mobility: Supine to Sit;Sit to Supine     Supine to sit: Mod assist Sit to supine: Mod assist   General bed mobility comments: intermittent minA for seated stability at EOB. dizziness worsening and nausea worsening.  Transfers Overall transfer level: Needs assistance Equipment used: 1 person hand held assist Transfers: Sit to/from Stand Sit to Stand: Mod assist         General transfer comment: Per PT    Balance Overall balance assessment: Needs assistance Sitting-balance support: Bilateral  upper extremity supported;Feet supported Sitting balance-Leahy Scale: Poor     Standing balance support: Bilateral upper extremity supported;During functional activity Standing balance-Leahy Scale: Poor                             ADL either performed or assessed with clinical judgement   ADL Overall ADL's : Needs assistance/impaired Eating/Feeding: Independent   Grooming: Minimal assistance   Upper Body Bathing: Minimal assistance   Lower Body Bathing: Maximal assistance   Upper Body Dressing : Minimal assistance   Lower Body Dressing: Maximal assistance                       Vision Patient Visual Report: No change from baseline Pt. wears glasses. Has a History of Shingles in his right eye.       Perception     Praxis      Pertinent Vitals/Pain Pain Assessment: No/denies pain     Hand Dominance Right   Extremity/Trunk Assessment Upper Extremity Assessment Upper Extremity Assessment: Generalized weakness          Communication     Cognition Arousal/Alertness: Awake/alert Behavior During Therapy: WFL for tasks assessed/performed Overall Cognitive Status: Within Functional Limits for tasks assessed                                     General Comments       Exercises     Shoulder Instructions      Home Living Family/patient expects to be discharged to:: Private residence Living  Arrangements: Alone Available Help at Discharge: Family;Available PRN/intermittently Type of Home: House Home Access: Stairs to enter [employer] of Steps: 2 Entrance Stairs-Rails: Right Home Layout: One level     Bathroom Shower/Tub: Tub/shower unit;Door         Home Equipment: Walker - 2 wheels          Prior Functioning/Environment Level of Independence: Independent        Comments: Daughter assists with groceries.        OT Problem List: Decreased strength;Impaired balance (sitting and/or  standing);Decreased range of motion;Impaired UE functional use;Decreased activity tolerance;Decreased coordination      OT Treatment/Interventions:      OT Goals(Current goals can be found in the care plan section) Acute Rehab OT Goals Patient Stated Goal: recover function and AMB capacity  OT Frequency:     Barriers to D/C:            Co-evaluation              AM-PAC OT "6 Clicks" Daily Activity     Outcome Measure Help from another person eating meals?: None Help from another person taking care of personal grooming?: A Little Help from another person toileting, which includes using toliet, bedpan, or urinal?: A Lot Help from another person bathing (including washing, rinsing, drying)?: A Lot Help from another person to put on and taking off regular upper body clothing?: A Little Help from another person to put on and taking off regular lower body clothing?: A Lot 6 Click Score: 16   End of Session    Activity Tolerance: Patient limited by lethargy Patient left: in bed;with call bell/phone within reach;with bed alarm set  OT Visit Diagnosis: Dizziness and giddiness (R42);Unsteadiness on feet (R26.81)                Time: 4239-5320 OT Time Calculation (min): 21 min Charges:  OT General Charges $OT Visit: 1 Visit OT Evaluation $OT Eval Low Complexity: 1 Low Harrel Carina, MS, OTR/L  Harrel Carina, MS, OTR/L 05/03/2019, 4:40 PM

## 2019-05-03 NOTE — Progress Notes (Signed)
[place] [facility] [place] NAME: Darrell Jacobson    MR#:  [id number]  DATE OF BIRTH:  11-28-1927  SUBJECTIVE:  CHIEF COMPLAINT:   Chief Complaint  Patient presents with  . Dizziness  . Headache   Dizzy and nausea.  REVIEW OF SYSTEMS:    Review of Systems  Constitutional: Positive for malaise/fatigue. Negative for chills and fever.  HENT: Negative for sore throat.   Eyes: Negative for blurred vision, double vision and pain.  Respiratory: Negative for cough, hemoptysis, shortness of breath and wheezing.   Cardiovascular: Negative for chest pain, palpitations, orthopnea and leg swelling.  Gastrointestinal: Positive for nausea. Negative for abdominal pain, constipation, diarrhea, heartburn and vomiting.  Genitourinary: Negative for dysuria and hematuria.  Musculoskeletal: Negative for back pain and joint pain.  Skin: Negative for rash.  Neurological: Positive for dizziness. Negative for sensory change, speech change, focal weakness and headaches.  Endo/Heme/Allergies: Does not bruise/bleed easily.  Psychiatric/Behavioral: Negative for depression. The patient is not nervous/anxious.     DRUG ALLERGIES:   Allergies  Allergen Reactions  . Prednisone Anxiety and Other (See Comments)  . Acyclovir Other (See Comments)  . Albuterol Sulfate Other (See Comments)  . Amlodipine Other (See Comments)  . Brinzolamide Other (See Comments)  . Budesonide-Formoterol Fumarate Other (See Comments)  . Buspirone Other (See Comments)  . Cefdinir Other (See Comments)  . Celecoxib Other (See Comments)  . Chlorthalidone Other (See Comments)  . Clopidogrel Nausea Only  . Codeine Nausea And Vomiting  . Diazepam Other (See Comments)  . Dicyclomine Other (See Comments)  . Duloxetine Other (See Comments)  . Famciclovir Other (See Comments)  . Felodipine Other (See Comments)  . Furosemide Other (See Comments)  . Gabapentin Swelling  . Hydrochlorothiazide Other  (See Comments)  . Indomethacin Other (See Comments)  . Lisinopril Other (See Comments)  . Losartan Other (See Comments)  . Meclizine Other (See Comments)  . Meloxicam Other (See Comments)  . Metaxalone Other (See Comments)  . Metoprolol Tartrate Other (See Comments)  . Morphine Other (See Comments)  . Nabumetone Other (See Comments)  . Naproxen Other (See Comments)  . Olmesartan Other (See Comments)  . Oxycodone     hallucinations  . Penicillin V Potassium Other (See Comments)  . Prasugrel Other (See Comments)    weakness  . Promethazine Other (See Comments)  . Sulfa Antibiotics Other (See Comments)  . Torsemide Other (See Comments)  . Tramadol Other (See Comments)  . Zolpidem Other (See Comments)    VITALS:  Blood pressure (!) 140/52, pulse 62, temperature 97.6 F (36.4 C), temperature source Oral, resp. rate 18, height 5' 5.98" (1.676 m), weight 78.5 kg, SpO2 97 %.  PHYSICAL EXAMINATION:   Physical Exam  GENERAL:  85 y.o.-year-old patient lying in the bed with no acute distress.  EYES: Pupils equal, round, reactive to light and accommodation. No scleral icterus. Extraocular muscles intact.  HEENT: Head atraumatic, normocephalic. Oropharynx and nasopharynx clear.  NECK:  Supple, no jugular venous distention. No thyroid enlargement, no tenderness.  LUNGS: Normal breath sounds bilaterally, no wheezing, rales, rhonchi. No use of accessory muscles of respiration.  CARDIOVASCULAR: S1, S2 normal. No murmurs, rubs, or gallops.  ABDOMEN: Soft, nontender, nondistended. Bowel sounds present. No organomegaly or mass.  EXTREMITIES: No cyanosis, clubbing or edema b/l.    NEUROLOGIC: Cranial nerves II through XII are intact.  Motor strength 5/5 upper and lower extremities LUE dysmetria PSYCHIATRIC: The patient is alert and  oriented x 3.  SKIN: No obvious rash, lesion, or ulcer.   LABORATORY PANEL:   CBC Recent Labs  Lab 05/03/19 0013  WBC 6.0  HGB 12.3*  HCT 35.3*  PLT 161    ------------------------------------------------------------------------------------------------------------------ Chemistries  Recent Labs  Lab 05/03/19 0013  NA 133*  K 3.7  CL 99  CO2 26  GLUCOSE 146*  BUN 13  CREATININE 0.69  CALCIUM 8.7*  AST 21  ALT 14  ALKPHOS 58  BILITOT 0.6   ------------------------------------------------------------------------------------------------------------------  Cardiac Enzymes No results for input(s): TROPONINI in the last 168 hours. ------------------------------------------------------------------------------------------------------------------  RADIOLOGY:  US Carotid Bilateral (at [facility] Only)  Result Date: 05/03/2019 CLINICAL DATA:  Hypertension, stroke, syncope EXAM: BILATERAL CAROTID DUPLEX ULTRASOUND TECHNIQUE: Pearline Cables scale imaging, color Doppler and duplex ultrasound were performed of bilateral carotid and vertebral arteries in the neck. COMPARISON:  11/05/2017 FINDINGS: Criteria: Quantification of carotid stenosis is based on velocity parameters that correlate the residual internal carotid diameter with NASCET-based stenosis levels, using the diameter of the distal internal carotid lumen as the denominator for stenosis measurement. The following velocity measurements were obtained: RIGHT ICA: 126/22 cm/sec CCA: 16/10 cm/sec SYSTOLIC ICA/CCA RATIO:  1.4 ECA: 103 cm/sec LEFT ICA: 157/21 cm/sec CCA: 96/04 cm/sec SYSTOLIC ICA/CCA RATIO:  1.8 ECA: 158 cm/sec RIGHT CAROTID ARTERY: Similar severe calcified plaque formation of the right carotid bifurcation. However, despite this there is no hemodynamically significant right ICA stenosis, velocity elevation, turbulent flow. Degree of narrowing still estimated at 50-69% by ultrasound criteria. RIGHT VERTEBRAL ARTERY:  Antegrade LEFT CAROTID ARTERY: Similar severe left carotid bifurcation atherosclerosis with echogenic shadowing calcification. Slight velocity elevation measuring 157/21  centimeters/second. No significant turbulent flow. Degree of stenosis estimated at 50-69% by ultrasound criteria. LEFT VERTEBRAL ARTERY:  Antegrade IMPRESSION: Severe bilateral carotid atherosclerosis with similar ICA 50-69% stenoses bilaterally. No significant interval change. Patent antegrade vertebral flow bilaterally. Electronically Signed   By: Jerilynn Mages.  Shick M.D.   On: 05/03/2019 09:27   Ct Head Code Stroke Wo Contrast  Result Date: 05/03/2019 CLINICAL DATA:  Code stroke. 85 y/o M; headache and dizziness. Focal neuro deficit, < 6 hrs, stroke suspected Altered level of consciousness (LOC), unexplained. EXAM: CT HEAD WITHOUT CONTRAST TECHNIQUE: Contiguous axial images were obtained from the base of the skull through the vertex without intravenous contrast. COMPARISON:  03/12/2015 CT head. FINDINGS: Brain: No evidence of acute infarction, hemorrhage, hydrocephalus, extra-axial collection or mass lesion/mass effect. Stable small chronic infarctions within the right caudate head and right thalamus. Stable small chronic cortical infarction in the right parietal lobe. Interval very small chronic cortical infarction in the left parietal lobe. Stable chronic microvascular ischemic changes of white matter and volume loss of the brain. Vascular: Calcific atherosclerosis of the internal carotid arteries. No hyperdense vessel. Skull: Normal. Negative for fracture or focal lesion. Sinuses/Orbits: No acute finding. Other: Right intra-ocular lens replacement. ASPECTS [facility] Stroke Program Early CT Score) - Ganglionic level infarction (caudate, lentiform nuclei, internal capsule, insula, M1-M3 cortex): 7 - Supraganglionic infarction (M4-M6 cortex): 3 Total score (0-10 with 10 being normal): 10 IMPRESSION: 1. No acute intracranial abnormality identified. 2. ASPECTS is 10. 3. New very small chronic cortical infarction in the left parietal lobe. Otherwise stable chronic microvascular ischemic changes, parenchymal volume loss,  and small chronic infarctions of the brain. These results were called by telephone at the time of interpretation on 05/03/2019 at 12:32 am to Dr. Hinda Kehr , who verbally acknowledged these results. Electronically Signed   By: Mia Creek  [place] M.D.   On: 05/03/2019 00:34     ASSESSMENT AND PLAN:   This is an 85 year old male admitted for TIA  1. Acute CVA - likely brainstem. CT head - Nothing acute  The patient has known left carotid artery stenosis.   Unable to get MRI due to Pacemaker CT head and neck pending. Discussed with Dr. Doy Mince.  2.  Hypertension Nebivolol  3.  Neuropathy: No change  4.  Pacemaker: Scheduled for replacement soon  5.  BPH: Continue Cardura  All the records are reviewed and case discussed with Care Management/Social Worker Management plans discussed with the patient, family and they are in agreement.  CODE STATUS: FULL CODE  DVT Prophylaxis: SCDs  TOTAL TIME TAKING CARE OF THIS PATIENT: 35 minutes.   POSSIBLE D/C IN 1-2 DAYS, DEPENDING ON CLINICAL CONDITION.  Leia Alf Beau Ramsburg M.D on 05/03/2019 at 1:37 PM  Between 7am to 6pm - Pager - [phone]  After 6pm go to [email or web address] - password [email or web address] [facility]  Office  [phone]  CC: Primary care physician; Baxter Hire, MD  Note: This dictation was prepared with Dragon dictation along with smaller phrase technology. Any transcriptional errors that result from this process are unintentional.

## 2019-05-03 NOTE — ED Notes (Signed)
md talking with neuro tele now.    Pt has shingles in right eye.  H/a for 4 months.  N/v tonight.  Pt had tingling in both legs earlier and left arm weakness.  No weakness and tingling now.  Paced rhythm on monitor.  Iv in place.

## 2019-05-03 NOTE — ED Notes (Signed)
Son called by pt with this nurse in the room

## 2019-05-03 NOTE — ED Notes (Addendum)
meds given for n/v  Pt alert  And oriented

## 2019-05-03 NOTE — Consult Note (Signed)
[facility] Cardiology  CARDIOLOGY CONSULT NOTE  Patient ID: Darrell Jacobson MRN: [id number] DOB/AGE: [birth year] 85 y.o.  Admit date: 05/02/2019 Referring Physician [place] Primary Physician [facility] Cardiologist Nehemiah Massed Reason for Consultation ischemic cardiomyopathy  HPI: 85 year old gentleman referred for evaluation of ischemic cardiomyopathy.  Patient admitted with slurred speech, weakness left arm consistent with transient ischemic attack in patient with known history of carotid stenosis.  The patient has known coronary artery disease, with history of inferior MI and coronary stent, ischemic cardiomyopathy with LVEF of 30%.  Patient has complete heart block status post dual-chamber pacemaker.  He was recently evaluated by Dr. Mylinda Latina Duke EP and is scheduled to undergo upgrade of dual-chamber pacemaker to CRT-P.  The patient denies chest pain.  He has chronic exertional dyspnea, NYHA class III symptoms.  Review of systems complete and found to be negative unless listed above     Past Medical History:  Diagnosis Date  . Arthritis   . Asthma    COPD with inhaler use  . Cancer [facility]) 2013   Basal Cell Skin CA resected from scalp.  . Carotid stenosis    pateint states he has Left carotid blockage worse than Right.   . CHF (congestive heart failure) ([place])   . Collagen vascular disease ([place])   . COPD (chronic obstructive pulmonary disease) ([place])   . Coronary artery disease   . Dyspnea   . GERD (gastroesophageal reflux disease)   . Glaucoma   . History of hiatal hernia   . History of shingles 2012   forehead  . Hypertension   . Multilevel degenerative disc disease   . Neuropathy    legs and feet  . Pacemaker 2006    Past Surgical History:  Procedure Laterality Date  . BACK SURGERY     2 lumbar, 1 thoracic  . CATARACT EXTRACTION W/PHACO Right 12/24/2016   Procedure: CATARACT EXTRACTION PHACO AND INTRAOCULAR LENS PLACEMENT ([place])  Right  complicated;  Surgeon: Leandrew Koyanagi, MD;  Location: [place];  Service: Ophthalmology;  Laterality: Right;  [place]  . JOINT REPLACEMENT Right    knee  . SKIN DEBRIDEMENT  06/13/2006   facial  . SKIN GRAFT Right 07/11/2016   facial    Medications Prior to Admission  Medication Sig Dispense Refill Last Dose  . acetaminophen (TYLENOL) 500 MG tablet Take 1 tablet by mouth as needed.   prn at prn  . albuterol (PROAIR HFA) 108 (90 BASE) MCG/ACT inhaler Inhale 2 puffs into the lungs every 6 (six) hours as needed.   prn at prn  . aspirin EC 81 MG tablet Take 1 tablet by mouth daily.   05/02/2019 at 0900  . bimatoprost (LUMIGAN) 0.01 % SOLN Apply 1 Dose to eye at bedtime.   05/02/2019 at 2100  . Cholecalciferol (D 2000) 2000 UNITS TABS Take 1 tablet by mouth daily.   05/02/2019 at 0900  . doxazosin (CARDURA) 8 MG tablet Take 1 tablet by mouth at bedtime.   05/02/2019 at 2100  . Ferrous Sulfate (SLOW FE PO) Take by mouth.   05/02/2019 at 2100  . fluticasone (FLONASE) 50 MCG/ACT nasal spray Place 2 sprays into the nose daily.     Marland Kitchen LORazepam (ATIVAN) 0.5 MG tablet Take 1 tablet by mouth 3 (three) times daily.   05/02/2019 at 2100  . montelukast (SINGULAIR) 10 MG tablet Take 1 tablet by mouth at bedtime.   05/02/2019 at 2100  . Multiple Vitamin (MULTI-VITAMINS) TABS Take 1 tablet by  mouth daily.   05/02/2019 at 0900  . nebivolol (BYSTOLIC) 2.5 MG tablet Take 1 tablet by mouth 3 (three) times daily.   05/02/2019 at 2100  . omeprazole (PRILOSEC) 20 MG capsule Take 1 capsule by mouth daily.   05/02/2019 at 0900  . prednisoLONE acetate (PRED FORTE) 1 % ophthalmic suspension Place 1 drop into the right eye daily.   05/02/2019 at 0900   Social History   Socioeconomic History  . Marital status: Widowed    Spouse name: Not on file  . Number of children: Not on file  . Years of education: Not on file  . Highest education level: Not on file  Occupational History  . Not on file  Social Needs  . Financial resource strain: Not  on file  . Food insecurity    Worry: Not on file    Inability: Not on file  . Transportation needs    Medical: Not on file    Non-medical: Not on file  Tobacco Use  . Smoking status: Former Smoker    Quit date: 11/03/1984    Years since quitting: 34.5  . Smokeless tobacco: Never Used  Substance and Sexual Activity  . Alcohol use: Yes    Alcohol/week: 14.0 standard drinks    Types: 14 Cans of beer per week  . Drug use: Not Currently  . Sexual activity: Not on file  Lifestyle  . Physical activity    Days per week: Not on file    Minutes per session: Not on file  . Stress: Not on file  Relationships  . Social Herbalist on phone: Not on file    Gets together: Not on file    Attends religious service: Not on file    Active member of club or organization: Not on file    Attends meetings of clubs or organizations: Not on file    Relationship status: Not on file  . Intimate partner violence    Fear of current or ex partner: Not on file    Emotionally abused: Not on file    Physically abused: Not on file    Forced sexual activity: Not on file  Other Topics Concern  . Not on file  Social History Narrative  . Not on file    No family history on file.    Review of systems complete and found to be negative unless listed above      PHYSICAL EXAM  General: Well developed, well nourished, in no acute distress HEENT:  Normocephalic and atramatic Neck:  No JVD.  Lungs: Clear bilaterally to auscultation and percussion. Heart: HRRR . Normal S1 and S2 without gallops or murmurs.  Abdomen: Bowel sounds are positive, abdomen soft and non-tender  Msk:  Back normal, normal gait. Normal strength and tone for age. Extremities: No clubbing, cyanosis or edema.   Neuro: Alert and oriented X 3. Psych:  Good affect, responds appropriately  Labs:   Lab Results  Component Value Date   WBC 6.0 05/03/2019   HGB 12.3 (L) 05/03/2019   HCT 35.3 (L) 05/03/2019   MCV 98.6  05/03/2019   PLT 161 05/03/2019    Recent Labs  Lab 05/03/19 0013  NA 133*  K 3.7  CL 99  CO2 26  BUN 13  CREATININE 0.69  CALCIUM 8.7*  PROT 6.8  BILITOT 0.6  ALKPHOS 58  ALT 14  AST 21  GLUCOSE 146*   Lab Results  Component Value Date   CKMB  2.7 03/16/2014   TROPONINI <0.03 10/18/2015    Lab Results  Component Value Date   CHOL 150 05/03/2019   CHOL 180 03/16/2014   Lab Results  Component Value Date   HDL 69 05/03/2019   HDL 59 03/16/2014   Lab Results  Component Value Date   LDLCALC 73 05/03/2019   LDLCALC 87 03/16/2014   Lab Results  Component Value Date   TRIG 42 05/03/2019   TRIG 169 03/16/2014   Lab Results  Component Value Date   CHOLHDL 2.2 05/03/2019   No results found for: LDLDIRECT    Radiology: Ct Head Code Stroke Wo Contrast  Result Date: 05/03/2019 CLINICAL DATA:  Code stroke. 85 y/o M; headache and dizziness. Focal neuro deficit, < 6 hrs, stroke suspected Altered level of consciousness (LOC), unexplained. EXAM: CT HEAD WITHOUT CONTRAST TECHNIQUE: Contiguous axial images were obtained from the base of the skull through the vertex without intravenous contrast. COMPARISON:  03/12/2015 CT head. FINDINGS: Brain: No evidence of acute infarction, hemorrhage, hydrocephalus, extra-axial collection or mass lesion/mass effect. Stable small chronic infarctions within the right caudate head and right thalamus. Stable small chronic cortical infarction in the right parietal lobe. Interval very small chronic cortical infarction in the left parietal lobe. Stable chronic microvascular ischemic changes of white matter and volume loss of the brain. Vascular: Calcific atherosclerosis of the internal carotid arteries. No hyperdense vessel. Skull: Normal. Negative for fracture or focal lesion. Sinuses/Orbits: No acute finding. Other: Right intra-ocular lens replacement. ASPECTS [facility] Stroke Program Early CT Score) - Ganglionic level infarction (caudate, lentiform  nuclei, internal capsule, insula, M1-M3 cortex): 7 - Supraganglionic infarction (M4-M6 cortex): 3 Total score (0-10 with 10 being normal): 10 IMPRESSION: 1. No acute intracranial abnormality identified. 2. ASPECTS is 10. 3. New very small chronic cortical infarction in the left parietal lobe. Otherwise stable chronic microvascular ischemic changes, parenchymal volume loss, and small chronic infarctions of the brain. These results were called by telephone at the time of interpretation on 05/03/2019 at 12:32 am to Dr. Hinda Kehr , who verbally acknowledged these results. Electronically Signed   By: Kristine Garbe M.D.   On: 05/03/2019 00:34    EKG: AV pacing  ASSESSMENT AND PLAN:   1.  Ischemic cardiomyopathy, scheduled for upgrade of dual-chamber pacemaker to CRT-P 2.  Known CAD, no chest pain, negative high-sensitivity troponin 3.  Probable TIA  Recommendations  1.  Agree with current therapy 2.  Review 2D echocardiogram 3.  No further cardiac diagnostics recommended at this time  Signed: Isaias Cowman MD,PhD, [facility] 05/03/2019, 8:29 AM

## 2019-05-04 ENCOUNTER — Observation Stay: Payer: Medicare HMO

## 2019-05-04 DIAGNOSIS — I959 Hypotension, unspecified: Secondary | ICD-10-CM | POA: Diagnosis not present

## 2019-05-04 DIAGNOSIS — Z03818 Encounter for observation for suspected exposure to other biological agents ruled out: Secondary | ICD-10-CM | POA: Diagnosis not present

## 2019-05-04 DIAGNOSIS — I255 Ischemic cardiomyopathy: Secondary | ICD-10-CM | POA: Diagnosis not present

## 2019-05-04 DIAGNOSIS — Z1159 Encounter for screening for other viral diseases: Secondary | ICD-10-CM | POA: Diagnosis not present

## 2019-05-04 DIAGNOSIS — R2689 Other abnormalities of gait and mobility: Secondary | ICD-10-CM | POA: Diagnosis not present

## 2019-05-04 DIAGNOSIS — I110 Hypertensive heart disease with heart failure: Secondary | ICD-10-CM | POA: Diagnosis not present

## 2019-05-04 DIAGNOSIS — I69814 Frontal lobe and executive function deficit following other cerebrovascular disease: Secondary | ICD-10-CM | POA: Diagnosis not present

## 2019-05-04 DIAGNOSIS — I69822 Dysarthria following other cerebrovascular disease: Secondary | ICD-10-CM | POA: Diagnosis not present

## 2019-05-04 DIAGNOSIS — I2510 Atherosclerotic heart disease of native coronary artery without angina pectoris: Secondary | ICD-10-CM | POA: Diagnosis not present

## 2019-05-04 DIAGNOSIS — Z20828 Contact with and (suspected) exposure to other viral communicable diseases: Secondary | ICD-10-CM | POA: Diagnosis not present

## 2019-05-04 DIAGNOSIS — I10 Essential (primary) hypertension: Secondary | ICD-10-CM | POA: Diagnosis not present

## 2019-05-04 DIAGNOSIS — I639 Cerebral infarction, unspecified: Secondary | ICD-10-CM | POA: Diagnosis not present

## 2019-05-04 DIAGNOSIS — J449 Chronic obstructive pulmonary disease, unspecified: Secondary | ICD-10-CM | POA: Diagnosis not present

## 2019-05-04 DIAGNOSIS — G459 Transient cerebral ischemic attack, unspecified: Secondary | ICD-10-CM | POA: Diagnosis not present

## 2019-05-04 DIAGNOSIS — R42 Dizziness and giddiness: Secondary | ICD-10-CM | POA: Diagnosis not present

## 2019-05-04 DIAGNOSIS — M6281 Muscle weakness (generalized): Secondary | ICD-10-CM | POA: Diagnosis not present

## 2019-05-04 DIAGNOSIS — R29898 Other symptoms and signs involving the musculoskeletal system: Secondary | ICD-10-CM | POA: Diagnosis not present

## 2019-05-04 DIAGNOSIS — G629 Polyneuropathy, unspecified: Secondary | ICD-10-CM | POA: Diagnosis not present

## 2019-05-04 DIAGNOSIS — M2550 Pain in unspecified joint: Secondary | ICD-10-CM | POA: Diagnosis not present

## 2019-05-04 DIAGNOSIS — I6523 Occlusion and stenosis of bilateral carotid arteries: Secondary | ICD-10-CM | POA: Diagnosis not present

## 2019-05-04 DIAGNOSIS — Z950 Presence of cardiac pacemaker: Secondary | ICD-10-CM | POA: Diagnosis not present

## 2019-05-04 DIAGNOSIS — I779 Disorder of arteries and arterioles, unspecified: Secondary | ICD-10-CM | POA: Diagnosis not present

## 2019-05-04 DIAGNOSIS — U071 COVID-19: Secondary | ICD-10-CM | POA: Diagnosis not present

## 2019-05-04 DIAGNOSIS — I5022 Chronic systolic (congestive) heart failure: Secondary | ICD-10-CM | POA: Diagnosis not present

## 2019-05-04 DIAGNOSIS — R200 Anesthesia of skin: Secondary | ICD-10-CM | POA: Diagnosis not present

## 2019-05-04 DIAGNOSIS — R319 Hematuria, unspecified: Secondary | ICD-10-CM | POA: Diagnosis not present

## 2019-05-04 DIAGNOSIS — Z7401 Bed confinement status: Secondary | ICD-10-CM | POA: Diagnosis not present

## 2019-05-04 DIAGNOSIS — R2681 Unsteadiness on feet: Secondary | ICD-10-CM | POA: Diagnosis not present

## 2019-05-04 DIAGNOSIS — I442 Atrioventricular block, complete: Secondary | ICD-10-CM | POA: Diagnosis not present

## 2019-05-04 DIAGNOSIS — R531 Weakness: Secondary | ICD-10-CM | POA: Diagnosis not present

## 2019-05-04 DIAGNOSIS — N390 Urinary tract infection, site not specified: Secondary | ICD-10-CM | POA: Diagnosis not present

## 2019-05-04 LAB — HEMOGLOBIN A1C
Hgb A1c MFr Bld: 5.2 % (ref 4.8–5.6)
Mean Plasma Glucose: 103 mg/dL

## 2019-05-04 MED ORDER — IOHEXOL 350 MG/ML SOLN
75.0000 mL | Freq: Once | INTRAVENOUS | Status: AC | PRN
  Administered 2019-05-04: 75 mL via INTRAVENOUS

## 2019-05-04 MED ORDER — ATORVASTATIN CALCIUM 20 MG PO TABS: 40.0000 mg | ORAL_TABLET | Freq: Every day | ORAL | Status: DC

## 2019-05-04 MED ORDER — ATORVASTATIN CALCIUM 40 MG PO TABS: 40.0000 mg | ORAL_TABLET | Freq: Every day | ORAL | Status: DC

## 2019-05-04 MED ORDER — LORAZEPAM 0.5 MG PO TABS: 0.5000 mg | ORAL_TABLET | Freq: Two times a day (BID) | ORAL | 0 refills | Status: AC | PRN

## 2019-05-04 NOTE — Progress Notes (Addendum)
Occupational Therapy Treatment Patient Details Name: Darrell Jacobson MRN: [id number] DOB: 1928/01/26 Today's Date: 05/04/2019    History of present illness Darrell Jacobson is an 85y.o. who was admitted to [facility] with severe worseing of dizziness, and slurred speech. Imaging revealed through CT chronic Left parietal lobe infarct. PMH: PPM, ICM pending PPM update.   OT comments  Pt. Reports feeling better today, and that dizziness is better than it was yesterday. Pt. required set-up of meal tray, assist opening juice container, and minA to spear grapes with a fork. Pt. education was provided about general reacher use for self-care. Pt. demonstrated good unsupported sitting balance at the EOB with no LOB while eating. Pt. reports having a reacher at home to retrieve items. Pt. Continues to benefit from OT services for ADL training, A/E training, and pt. education about home modification, and DME. Pt. continues to benefit from SNF level of care upon discharge. Pt. could benefit from follow-up OT services at discharge to maximize independence with ADLs, and IADLs.   Follow Up Recommendations  SNF    Equipment Recommendations  3 in 1 bedside commode    Recommendations for Other Services      Precautions / Restrictions Precautions Precautions: ICD/Pacemaker;Fall Restrictions Weight Bearing Restrictions: No       Mobility Bed Mobility Overal bed mobility: Needs Assistance             General bed mobility comments: Pt. sitting at EOB upon arrival.  Transfers Overall transfer level: Needs assistance Equipment used: 1 person hand held assist Transfers: Sit to/from Stand Sit to Stand: Mod assist         General transfer comment: Mobility per PT report    Balance                                           ADL either performed or assessed with clinical judgement   ADL Overall ADL's : Independent Eating/Feeding: Independent;Set up Eating/Feeding Details  (indicate cue type and reason): Pt. required complete set-up, and  minA to spear grapes with a fork. Grooming: Minimal assistance   Upper Body Bathing: Minimal assistance   Lower Body Bathing: Maximal assistance;Moderate assistance   Upper Body Dressing : Minimal assistance   Lower Body Dressing: Maximal assistance                       Vision       Perception     Praxis      Cognition Arousal/Alertness: Awake/alert Behavior During Therapy: WFL for tasks assessed/performed Overall Cognitive Status: Within Functional Limits for tasks assessed                                          Exercises     Shoulder Instructions       General Comments      Pertinent Vitals/ Pain       Pain Assessment: No/denies pain  Home Living                                          Prior Functioning/Environment              Frequency  Min 2X/week        Progress Toward Goals  OT Goals(current goals can now be found in the care plan section)  Progress towards OT goals: Progressing toward goals  Acute Rehab OT Goals Patient Stated Goal: To return home  Plan Discharge plan remains appropriate    Co-evaluation                 AM-PAC OT "6 Clicks" Daily Activity     Outcome Measure   Help from another person eating meals?: A Little Help from another person taking care of personal grooming?: A Little Help from another person toileting, which includes using toliet, bedpan, or urinal?: A Lot   Help from another person to put on and taking off regular upper body clothing?: A Little Help from another person to put on and taking off regular lower body clothing?: A Lot 6 Click Score: 13    End of Session    OT Visit Diagnosis: Dizziness and giddiness (R42);Unsteadiness on feet (R26.81)   Activity Tolerance Patient tolerated treatment well   Patient Left in bed;with call bell/phone within reach;with bed alarm set    Nurse Communication          Time: 1135-1150 OT Time Calculation (min): 15 min  Charges: OT Treatments $Self Care/Home Management : 8-22 mins  Harrel Carina, MS, OTR/L  Harrel Carina 05/04/2019, 1:27 PM

## 2019-05-04 NOTE — Progress Notes (Signed)
OT Cancellation Note  Patient Details Name: Darrell Jacobson MRN: [id number] DOB: [birth year]   Cancelled Treatment:    Reason Eval/Treat Not Completed: Patient at procedure or test/ unavailable(WIll continue to monitor, and intevene at a later time/date.)  Harrel Carina , MS, OTR/L 05/04/2019, 9:20 AM

## 2019-05-04 NOTE — Progress Notes (Signed)
Advance care planning  Purpose of Encounter Acute CVA  Parties in Attendance Patient  Patients Decisional capacity Alert and oriented.  Able to make medical decisions  His son Darrell Jacobson is the documented healthcare power of attorney.  No ACP documents in place  Discussed in detail regarding acute CVA.  Treatment plan , prognosis discussed.  All questions answered.  CODE STATUS discussed with patient.  Patient wishes for aggressive medical care with defibrillation/CPR/intubation if needed.  He would not want long-term life support.  Orders entered and CODE STATUS changed  FULL CODE  Time spent - 17 minutes

## 2019-05-04 NOTE — Progress Notes (Signed)
Son updated via telephone regarding patient status and plan of care. Son is now wanting patient to go home with home health. SW to be made aware of above. Madlyn Frankel, RN

## 2019-05-04 NOTE — TOC Progression Note (Signed)
Transition of Care [facility]) - Progression Note    Patient Details  Name: AXEL FRISK MRN: [id number] Date of Birth: Apr 19, 1928  Transition of Care [facility]) CM/SW Contact  Mackenzie Lia, Lenice Llamas Phone Number: [phone]  05/04/2019, 1:56 PM  Clinical Narrative: Clinical Social Worker (CSW) presented SNF bed offers to patient's son [name]. [name] talked with patient and they agreed on [facility]. [employer] SNF authorization has been received. Authorization # [id number]. [facility] admissions coordinator at [facility] is aware of above. Patient is aware that [facility] approved SNF. CSW will continue to follow and assist as needed.           Expected Discharge Plan and Services                                                 Social Determinants of Health (SDOH) Interventions    Readmission Risk Interventions No flowsheet data found.

## 2019-05-04 NOTE — Progress Notes (Signed)
Patient discharged via EMS. Malyn Aytes S, RN  

## 2019-05-04 NOTE — TOC Progression Note (Signed)
Transition of Care [facility]) - Progression Note    Patient Details  Name: JOANATHAN AFFELDT MRN: [id number] Date of Birth: 1928-03-14  Transition of Care [facility]) CM/SW Contact  Jaymarion Trombly, Lenice Llamas Phone Number: [phone]  05/04/2019, 11:36 AM  Clinical Narrative: Clinical Social Worker (CSW) met with patient to discuss D/C plan. Patient refused SNF and refused home health. Per patient he had a friend that told him home health did not help her at all. Per patient his son [name] will take care of him. Patient gave CSW permission to call his son [name]. CSW contacted [name] and made him aware of above. Per [name] he works full time and can't take care of patient. CSW made patient aware of above and patient said he will consider SNF.          Expected Discharge Plan and Services                                                 Social Determinants of Health (SDOH) Interventions    Readmission Risk Interventions No flowsheet data found.

## 2019-05-04 NOTE — Discharge Summary (Signed)
Darrell Jacobson at [place] NAME: Darrell Jacobson    MR#:  [id number]  DATE OF BIRTH:  15-May-1928  DATE OF ADMISSION:  05/02/2019 ADMITTING PHYSICIAN: Harrie Foreman, MD  DATE OF DISCHARGE: 05/04/2019  PRIMARY CARE PHYSICIAN: Baxter Hire, MD   ADMISSION DIAGNOSIS:  TIA (transient ischemic attack) [G45.9] Acute nonintractable headache, unspecified headache type [R51] Nausea and vomiting, intractability of vomiting not specified, unspecified vomiting type [R11.2]  DISCHARGE DIAGNOSIS:  Active Problems:   TIA (transient ischemic attack)  SECONDARY DIAGNOSIS:   Past Medical History:  Diagnosis Date  . Arthritis   . Asthma    COPD with inhaler use  . Cancer [facility]) 2013   Basal Cell Skin CA resected from scalp.  . Carotid stenosis    pateint states he has Left carotid blockage worse than Right.   . CHF (congestive heart failure) ([place])   . Collagen vascular disease ([place])   . COPD (chronic obstructive pulmonary disease) ([place])   . Coronary artery disease   . Dyspnea   . GERD (gastroesophageal reflux disease)   . Glaucoma   . History of hiatal hernia   . History of shingles 2012   forehead  . Hypertension   . Multilevel degenerative disc disease   . Neuropathy    legs and feet  . Pacemaker 2006     ADMITTING HISTORY  Chief Complaint: Slurred speech HPI: The patient with past medical history of hypertension, basal cell skin cancer status post resection and skin grafting; coronary artery disease and carotid stenosis presents to the emergency department complaining of slurred speech.  Apparently he was on the phone with a relative when his speech became unclear.  The patient also reports feeling dizzy and numb across his forehead, right face and left arm.  CT of his head in the emergency department was negative for acute process.  By the time this examiner was called to see the patient symptoms had resolved except for some subjective  neglect of his left arm.  The patient denies chest pain, palpitations, or shortness of breath.  Once patient was able as the emergency department staff called the hospitalist service for admission.  HOSPITAL COURSE:   This is an 85 year old male admitted for TIA  1.Acute CVA - likely brainstem. CT head - Nothing acute CTA head and neck with some areas of stenosis but nothing needing any intervention  The patient has known carotid artery stenosis Unable to get MRI due to Pacemaker  Discussed with Dr. Doy Mince. Patient on aspirin at home.  Ideally should be started on Plavix but has allergy.  Discussed with neurology.  Advised to continue aspirin. I have added Lipitor.  2. Hypertension Nebivolol  3. Neuropathy: No change  4. Pacemaker: Scheduled for replacement soon  5. BPH: Continue Cardura  With his gait and weakness issues patient will benefit from further physical therapy and occupational therapy at skilled nursing facility.  Was evaluated by PT/OT here.  Patient stable for discharge to skilled nursing facility  CONSULTS OBTAINED:  Treatment Team:  Alexis Goodell, MD Catarina Hartshorn, MD  DRUG ALLERGIES:   Allergies  Allergen Reactions  . Prednisone Anxiety and Other (See Comments)  . Acyclovir Other (See Comments)  . Albuterol Sulfate Other (See Comments)  . Amlodipine Other (See Comments)  . Brinzolamide Other (See Comments)  . Budesonide-Formoterol Fumarate Other (See Comments)  . Buspirone Other (See Comments)  . Cefdinir Other (See Comments)  . Celecoxib Other (See Comments)  .  Chlorthalidone Other (See Comments)  . Clopidogrel Nausea Only  . Codeine Nausea And Vomiting  . Diazepam Other (See Comments)  . Dicyclomine Other (See Comments)  . Duloxetine Other (See Comments)  . Famciclovir Other (See Comments)  . Felodipine Other (See Comments)  . Furosemide Other (See Comments)  . Gabapentin Swelling  . Hydrochlorothiazide Other (See Comments)   . Indomethacin Other (See Comments)  . Lisinopril Other (See Comments)  . Losartan Other (See Comments)  . Meclizine Other (See Comments)  . Meloxicam Other (See Comments)  . Metaxalone Other (See Comments)  . Metoprolol Tartrate Other (See Comments)  . Morphine Other (See Comments)  . Nabumetone Other (See Comments)  . Naproxen Other (See Comments)  . Olmesartan Other (See Comments)  . Oxycodone     hallucinations  . Penicillin V Potassium Other (See Comments)  . Prasugrel Other (See Comments)    weakness  . Promethazine Other (See Comments)  . Sulfa Antibiotics Other (See Comments)  . Torsemide Other (See Comments)  . Tramadol Other (See Comments)  . Zolpidem Other (See Comments)    DISCHARGE MEDICATIONS:   Allergies as of 05/04/2019      Reactions   Prednisone Anxiety, Other (See Comments)   Acyclovir Other (See Comments)   Albuterol Sulfate Other (See Comments)   Amlodipine Other (See Comments)   Brinzolamide Other (See Comments)   Budesonide-formoterol Fumarate Other (See Comments)   Buspirone Other (See Comments)   Cefdinir Other (See Comments)   Celecoxib Other (See Comments)   Chlorthalidone Other (See Comments)   Clopidogrel Nausea Only   Codeine Nausea And Vomiting   Diazepam Other (See Comments)   Dicyclomine Other (See Comments)   Duloxetine Other (See Comments)   Famciclovir Other (See Comments)   Felodipine Other (See Comments)   Furosemide Other (See Comments)   Gabapentin Swelling   Hydrochlorothiazide Other (See Comments)   Indomethacin Other (See Comments)   Lisinopril Other (See Comments)   Losartan Other (See Comments)   Meclizine Other (See Comments)   Meloxicam Other (See Comments)   Metaxalone Other (See Comments)   Metoprolol Tartrate Other (See Comments)   Morphine Other (See Comments)   Nabumetone Other (See Comments)   Naproxen Other (See Comments)   Olmesartan Other (See Comments)   Oxycodone    hallucinations   Penicillin V  Potassium Other (See Comments)   Prasugrel Other (See Comments)   weakness   Promethazine Other (See Comments)   Sulfa Antibiotics Other (See Comments)   Torsemide Other (See Comments)   Tramadol Other (See Comments)   Zolpidem Other (See Comments)      Medication List    TAKE these medications   acetaminophen 500 MG tablet Commonly known as: TYLENOL Take 1 tablet by mouth as needed.   aspirin EC 81 MG tablet Take 1 tablet by mouth daily.   atorvastatin 40 MG tablet Commonly known as: LIPITOR Take 1 tablet (40 mg total) by mouth daily at 6 PM.   D 2000 50 MCG (2000 UT) Tabs Generic drug: Cholecalciferol Take 1 tablet by mouth daily.   doxazosin 8 MG tablet Commonly known as: CARDURA Take 1 tablet by mouth at bedtime.   fluticasone 50 MCG/ACT nasal spray Commonly known as: FLONASE Place 2 sprays into the nose daily.   LORazepam 0.5 MG tablet Commonly known as: ATIVAN Take 1 tablet (0.5 mg total) by mouth 2 (two) times daily as needed for anxiety. What changed:   when to take this  reasons  to take this   Lumigan 0.01 % Soln Generic drug: bimatoprost Apply 1 Dose to eye at bedtime.   montelukast 10 MG tablet Commonly known as: SINGULAIR Take 1 tablet by mouth at bedtime.   Multi-Vitamins Tabs Take 1 tablet by mouth daily.   nebivolol 2.5 MG tablet Commonly known as: BYSTOLIC Take 1 tablet by mouth 3 (three) times daily.   omeprazole 20 MG capsule Commonly known as: PRILOSEC Take 1 capsule by mouth daily.   prednisoLONE acetate 1 % ophthalmic suspension Commonly known as: PRED FORTE Place 1 drop into the right eye daily.   ProAir HFA 108 (90 Base) MCG/ACT inhaler Generic drug: albuterol Inhale 2 puffs into the lungs every 6 (six) hours as needed.   SLOW FE PO Take by mouth.       Today   VITAL SIGNS:  Blood pressure (!) 142/62, pulse 60, temperature 98.2 F (36.8 C), temperature source Oral, resp. rate 17, height 5' 5.98" (1.676 m), weight  78.5 kg, SpO2 96 %.  I/O:    Intake/Output Summary (Last 24 hours) at 05/04/2019 1513 Last data filed at 05/04/2019 0920 Gross per 24 hour  Intake 0 ml  Output 700 ml  Net -700 ml    PHYSICAL EXAMINATION:  Physical Exam  GENERAL:  85 y.o.-year-old patient lying in the bed with no acute distress.  LUNGS: Normal breath sounds bilaterally, no wheezing, rales,rhonchi or crepitation. No use of accessory muscles of respiration.  CARDIOVASCULAR: S1, S2 normal. No murmurs, rubs, or gallops.  ABDOMEN: Soft, non-tender, non-distended. Bowel sounds present. No organomegaly or mass.  NEUROLOGIC: Moves all 4 extremities. PSYCHIATRIC: The patient is alert and oriented x 3.  SKIN: No obvious rash, lesion, or ulcer.   DATA REVIEW:   CBC Recent Labs  Lab 05/03/19 0013  WBC 6.0  HGB 12.3*  HCT 35.3*  PLT 161    Chemistries  Recent Labs  Lab 05/03/19 0013  NA 133*  K 3.7  CL 99  CO2 26  GLUCOSE 146*  BUN 13  CREATININE 0.69  CALCIUM 8.7*  AST 21  ALT 14  ALKPHOS 58  BILITOT 0.6    Cardiac Enzymes No results for input(s): TROPONINI in the last 168 hours.  Microbiology Results  Results for orders placed or performed during the hospital encounter of 05/02/19  SARS Coronavirus 2 (CEPHEID - Performed in [place] hospital lab), Hosp Order     Status: None   Collection Time: 05/03/19 12:36 AM   Specimen: Nasopharyngeal Swab  Result Value Ref Range Status   SARS Coronavirus 2 NEGATIVE NEGATIVE Final    Comment: (NOTE) If result is NEGATIVE SARS-CoV-2 target nucleic acids are NOT DETECTED. The SARS-CoV-2 RNA is generally detectable in upper and lower  respiratory specimens during the acute phase of infection. The lowest  concentration of SARS-CoV-2 viral copies this assay can detect is 250  copies / mL. A negative result does not preclude SARS-CoV-2 infection  and should not be used as the sole basis for treatment or other  patient management decisions.  A negative result  may occur with  improper specimen collection / handling, submission of specimen other  than nasopharyngeal swab, presence of viral mutation(s) within the  areas targeted by this assay, and inadequate number of viral copies  (<250 copies / mL). A negative result must be combined with clinical  observations, patient history, and epidemiological information. If result is POSITIVE SARS-CoV-2 target nucleic acids are DETECTED. The SARS-CoV-2 RNA is generally detectable in  upper and lower  respiratory specimens dur ing the acute phase of infection.  Positive  results are indicative of active infection with SARS-CoV-2.  Clinical  correlation with patient history and other diagnostic information is  necessary to determine patient infection status.  Positive results do  not rule out bacterial infection or co-infection with other viruses. If result is PRESUMPTIVE POSTIVE SARS-CoV-2 nucleic acids MAY BE PRESENT.   A presumptive positive result was obtained on the submitted specimen  and confirmed on repeat testing.  While 2019 novel coronavirus  (SARS-CoV-2) nucleic acids may be present in the submitted sample  additional confirmatory testing may be necessary for epidemiological  and / or clinical management purposes  to differentiate between  SARS-CoV-2 and other Sarbecovirus currently known to infect humans.  If clinically indicated additional testing with an alternate test  methodology [phone]) is advised. The SARS-CoV-2 RNA is generally  detectable in upper and lower respiratory sp ecimens during the acute  phase of infection. The expected result is Negative. Fact Sheet for Patients:  StrictlyIdeas.no Fact Sheet for Healthcare Providers: BankingDealers.co.za This test is not yet approved or cleared by the [place] FDA and has been authorized for detection and/or diagnosis of SARS-CoV-2 by FDA under an Emergency Use Authorization (EUA).   This EUA will remain in effect (meaning this test can be used) for the duration of the COVID-19 declaration under Section 564(b)(1) of the Act, 21 U.S.C. section 360bbb-3(b)(1), unless the authorization is terminated or revoked sooner. Performed at [facility], [address] [place], [place] [address]     RADIOLOGY:  Ct Angio Head W Or Wo Contrast  Result Date: 05/04/2019 CLINICAL DATA:  Stroke EXAM: CT ANGIOGRAPHY HEAD AND NECK TECHNIQUE: Multidetector CT imaging of the head and neck was performed using the standard protocol during bolus administration of intravenous contrast. Multiplanar CT image reconstructions and MIPs were obtained to evaluate the vascular anatomy. Carotid stenosis measurements (when applicable) are obtained utilizing NASCET criteria, using the distal internal carotid diameter as the denominator. CONTRAST:  75mL OMNIPAQUE IOHEXOL 350 MG/ML SOLN COMPARISON:  CT head 05/03/2019 FINDINGS: CTA NECK FINDINGS Aortic arch: Extensive atherosclerotic disease aortic arch without aneurysm or dissection. Dual lead pacemaker. Atherosclerotic calcification proximal great vessels diffusely without significant stenosis. Right carotid system: Atherosclerotic calcification right carotid bifurcation extending into the proximal right internal carotid artery approximately 40% diameter stenosis right internal carotid artery. Nonstenotic calcification right internal carotid artery below the skull base. Left carotid system: Atherosclerotic calcification left carotid bifurcation extending into the proximal left internal carotid artery. Estimated 60% diameter stenosis. Vertebral arteries: Right vertebral artery dominant and patent to the basilar without significant stenosis. Left vertebral artery occluded at the origin. Minimal reconstitution at the C3 level with subsequent abrupt occlusion at C2. Reconstitution of diffusely diseased vessel at the C1 level which then occludes at the skull base.  Reconstitution of irregular lumen of the left vertebral artery above the skull base with irregularity and atherosclerotic plaque versus thrombus. Skeleton: Cervical spondylosis.  No acute skeletal abnormality. Other neck: Negative for mass or adenopathy. Upper chest: Lung apices clear bilaterally. Review of the MIP images confirms the above findings CTA HEAD FINDINGS Anterior circulation: Extensive atherosclerotic calcification in the cavernous carotid bilaterally with moderate stenosis bilaterally. Anterior and middle cerebral arteries patent bilaterally without stenosis or occlusion. Posterior circulation: Right vertebral artery supplies the basilar and is widely patent. Right PICA patent. Diffusely diseased distal left vertebral artery with mild contribution to the basilar. Irregular plaque or  thrombus distal left vertebral artery proximal to PICA. Basilar patent. Right AICA patent. Superior cerebellar and posterior cerebral arteries patent bilaterally without significant stenosis. Venous sinuses: Patent Anatomic variants: None Delayed phase: Not performed Review of the MIP images confirms the above findings IMPRESSION: 1. 40% diameter stenosis proximal right internal carotid artery. Moderate stenosis right cavernous carotid. 2. 60% diameter stenosis proximal left internal carotid artery with moderate stenosis left cavernous carotid 3. Occlusion of the left vertebral artery in the proximal midportion with intermittent reconstitution distally. Irregular plaque or thrombus left V3 segment. Small contribution to the basilar. Right vertebral artery widely patent and supplies the basilar. 4. Negative for emergent intracranial large vessel occlusion. Electronically Signed   By: Franchot Gallo M.D.   On: 05/04/2019 09:21   Ct Angio Neck W Or Wo Contrast  Result Date: 05/04/2019 CLINICAL DATA:  Stroke EXAM: CT ANGIOGRAPHY HEAD AND NECK TECHNIQUE: Multidetector CT imaging of the head and neck was performed using the  standard protocol during bolus administration of intravenous contrast. Multiplanar CT image reconstructions and MIPs were obtained to evaluate the vascular anatomy. Carotid stenosis measurements (when applicable) are obtained utilizing NASCET criteria, using the distal internal carotid diameter as the denominator. CONTRAST:  19mL OMNIPAQUE IOHEXOL 350 MG/ML SOLN COMPARISON:  CT head 05/03/2019 FINDINGS: CTA NECK FINDINGS Aortic arch: Extensive atherosclerotic disease aortic arch without aneurysm or dissection. Dual lead pacemaker. Atherosclerotic calcification proximal great vessels diffusely without significant stenosis. Right carotid system: Atherosclerotic calcification right carotid bifurcation extending into the proximal right internal carotid artery approximately 40% diameter stenosis right internal carotid artery. Nonstenotic calcification right internal carotid artery below the skull base. Left carotid system: Atherosclerotic calcification left carotid bifurcation extending into the proximal left internal carotid artery. Estimated 60% diameter stenosis. Vertebral arteries: Right vertebral artery dominant and patent to the basilar without significant stenosis. Left vertebral artery occluded at the origin. Minimal reconstitution at the C3 level with subsequent abrupt occlusion at C2. Reconstitution of diffusely diseased vessel at the C1 level which then occludes at the skull base. Reconstitution of irregular lumen of the left vertebral artery above the skull base with irregularity and atherosclerotic plaque versus thrombus. Skeleton: Cervical spondylosis.  No acute skeletal abnormality. Other neck: Negative for mass or adenopathy. Upper chest: Lung apices clear bilaterally. Review of the MIP images confirms the above findings CTA HEAD FINDINGS Anterior circulation: Extensive atherosclerotic calcification in the cavernous carotid bilaterally with moderate stenosis bilaterally. Anterior and middle cerebral  arteries patent bilaterally without stenosis or occlusion. Posterior circulation: Right vertebral artery supplies the basilar and is widely patent. Right PICA patent. Diffusely diseased distal left vertebral artery with mild contribution to the basilar. Irregular plaque or thrombus distal left vertebral artery proximal to PICA. Basilar patent. Right AICA patent. Superior cerebellar and posterior cerebral arteries patent bilaterally without significant stenosis. Venous sinuses: Patent Anatomic variants: None Delayed phase: Not performed Review of the MIP images confirms the above findings IMPRESSION: 1. 40% diameter stenosis proximal right internal carotid artery. Moderate stenosis right cavernous carotid. 2. 60% diameter stenosis proximal left internal carotid artery with moderate stenosis left cavernous carotid 3. Occlusion of the left vertebral artery in the proximal midportion with intermittent reconstitution distally. Irregular plaque or thrombus left V3 segment. Small contribution to the basilar. Right vertebral artery widely patent and supplies the basilar. 4. Negative for emergent intracranial large vessel occlusion. Electronically Signed   By: Franchot Gallo M.D.   On: 05/04/2019 09:21   US Carotid Bilateral (at [place]  Only)  Result Date: 05/03/2019 CLINICAL DATA:  Hypertension, stroke, syncope EXAM: BILATERAL CAROTID DUPLEX ULTRASOUND TECHNIQUE: Pearline Cables scale imaging, color Doppler and duplex ultrasound were performed of bilateral carotid and vertebral arteries in the neck. COMPARISON:  11/05/2017 FINDINGS: Criteria: Quantification of carotid stenosis is based on velocity parameters that correlate the residual internal carotid diameter with NASCET-based stenosis levels, using the diameter of the distal internal carotid lumen as the denominator for stenosis measurement. The following velocity measurements were obtained: RIGHT ICA: 126/22 cm/sec CCA: 92/33 cm/sec SYSTOLIC ICA/CCA RATIO:  1.4 ECA: 103  cm/sec LEFT ICA: 157/21 cm/sec CCA: 00/76 cm/sec SYSTOLIC ICA/CCA RATIO:  1.8 ECA: 158 cm/sec RIGHT CAROTID ARTERY: Similar severe calcified plaque formation of the right carotid bifurcation. However, despite this there is no hemodynamically significant right ICA stenosis, velocity elevation, turbulent flow. Degree of narrowing still estimated at 50-69% by ultrasound criteria. RIGHT VERTEBRAL ARTERY:  Antegrade LEFT CAROTID ARTERY: Similar severe left carotid bifurcation atherosclerosis with echogenic shadowing calcification. Slight velocity elevation measuring 157/21 centimeters/second. No significant turbulent flow. Degree of stenosis estimated at 50-69% by ultrasound criteria. LEFT VERTEBRAL ARTERY:  Antegrade IMPRESSION: Severe bilateral carotid atherosclerosis with similar ICA 50-69% stenoses bilaterally. No significant interval change. Patent antegrade vertebral flow bilaterally. Electronically Signed   By: Jerilynn Mages.  Shick M.D.   On: 05/03/2019 09:27   Ct Head Code Stroke Wo Contrast  Result Date: 05/03/2019 CLINICAL DATA:  Code stroke. 85 y/o M; headache and dizziness. Focal neuro deficit, < 6 hrs, stroke suspected Altered level of consciousness (LOC), unexplained. EXAM: CT HEAD WITHOUT CONTRAST TECHNIQUE: Contiguous axial images were obtained from the base of the skull through the vertex without intravenous contrast. COMPARISON:  03/12/2015 CT head. FINDINGS: Brain: No evidence of acute infarction, hemorrhage, hydrocephalus, extra-axial collection or mass lesion/mass effect. Stable small chronic infarctions within the right caudate head and right thalamus. Stable small chronic cortical infarction in the right parietal lobe. Interval very small chronic cortical infarction in the left parietal lobe. Stable chronic microvascular ischemic changes of white matter and volume loss of the brain. Vascular: Calcific atherosclerosis of the internal carotid arteries. No hyperdense vessel. Skull: Normal. Negative for  fracture or focal lesion. Sinuses/Orbits: No acute finding. Other: Right intra-ocular lens replacement. ASPECTS [facility] Stroke Program Early CT Score) - Ganglionic level infarction (caudate, lentiform nuclei, internal capsule, insula, M1-M3 cortex): 7 - Supraganglionic infarction (M4-M6 cortex): 3 Total score (0-10 with 10 being normal): 10 IMPRESSION: 1. No acute intracranial abnormality identified. 2. ASPECTS is 10. 3. New very small chronic cortical infarction in the left parietal lobe. Otherwise stable chronic microvascular ischemic changes, parenchymal volume loss, and small chronic infarctions of the brain. These results were called by telephone at the time of interpretation on 05/03/2019 at 12:32 am to Dr. Hinda Kehr , who verbally acknowledged these results. Electronically Signed   By: Kristine Garbe M.D.   On: 05/03/2019 00:34    Follow up with PCP in 1 week.  Management plans discussed with the patient, family and they are in agreement.  CODE STATUS:     Code Status Orders  (From admission, onward)         Start     Ordered   05/03/19 0429  Full code  Continuous     05/03/19 0428        Code Status History    This patient has a current code status but no historical code status.   Advance Care Planning Activity      TOTAL TIME  TAKING CARE OF THIS PATIENT ON DAY OF DISCHARGE: more than 30 minutes.   Leia Alf Terita Hejl M.D on 05/04/2019 at 3:13 PM  Between 7am to 6pm - Pager - [phone]  After 6pm go to [email or web address] - password [email or web address]  Office  [phone]  CC: Primary care physician; Baxter Hire, MD  Note: This dictation was prepared with Dragon dictation along with smaller phrase technology. Any transcriptional errors that result from this process are unintentional.

## 2019-05-04 NOTE — Care Management Obs Status (Signed)
[place] NOTIFICATION   Patient Details  Name: Darrell Jacobson MRN: [id number] Date of Birth: 02/24/1928   Medicare Observation Status Notification Given:  Yes    Garner Dullea, Veronia Beets, LCSW 05/04/2019, 10:52 AM

## 2019-05-04 NOTE — Progress Notes (Signed)
Report called to [place] at [facility]. EMS called for transport. Madlyn Frankel, RN

## 2019-05-04 NOTE — TOC Transition Note (Signed)
Transition of Care [facility]) - CM/SW Discharge Note   Patient Details  Name: Darrell Jacobson MRN: [id number] Date of Birth: 1928/07/09  Transition of Care [facility]) CM/SW Contact:  Valen Mascaro, Lenice Llamas Phone Number: [phone]  05/04/2019, 4:17 PM   Clinical Narrative:   Patient is medically stable for D/C to [place] today. [place] authorization has been received. Per [facility] admissions coordinator at [facility] patient can come today to room 504. RN will call report and arrange EMS for transport. Clinical Education officer, museum (CSW) sent D/C orders to [facility] via [place] today. Patient and his son Darrell Jacobson are aware of above. Please reconsult if future social work needs arise. CSW signing off.     Final next level of care: Skilled Nursing Facility Barriers to Discharge: Barriers Resolved   Patient Goals and CMS Choice Patient states their goals for this hospitalization and ongoing recovery are:: Improve mobility   Choice offered to / list presented to : [facility] POA / Guardian, Adult Children  Discharge Placement PASRR number recieved: 05/03/19            Patient [facility] bed at: [facility] Patient to be transferred to facility by: [facility] Name of family member notified: Patient's son Darrell Jacobson is aware of D/C today. Patient and family notified of of transfer: 05/04/19  Discharge Plan and Services                                     Social Determinants of Health (SDOH) Interventions     Readmission Risk Interventions No flowsheet data found.

## 2019-05-04 NOTE — Progress Notes (Signed)
Physical Therapy Treatment Patient Details Name: Darrell Jacobson MRN: [id number] DOB: 1928/07/24 Today's Date: 05/04/2019    History of Present Illness Darrell Jacobson is an 85y.o. who was admitted to [facility] with severe worseing of dizziness, and slurred speech. Imaging revealed through CT chronic Left parietal lobe infarct. PMH: PPM, ICM pending PPM update.    PT Comments    5/10 dizziness reported with transition to upright; orthostatics positive. However, patient insistent upon attempting gait this date.  Very poor awareness/insightinto deficits and overall safety needs.  RN informed/aware. Heavy L  Lateral lean with all standing activities; absent balance correction or protective righting.  Very high fall risk as result. Unsafe to attempt without +2 at all times.   Follow Up Recommendations  SNF;Supervision for mobility/OOB;Supervision/Assistance - 24 hour     Equipment Recommendations       Recommendations for Other Services       Precautions / Restrictions Precautions Precautions: Fall;ICD/Pacemaker Restrictions Weight Bearing Restrictions: No    Mobility  Bed Mobility Overal bed mobility: Needs Assistance Bed Mobility: Supine to Sit     Supine to sit: Min assist;Mod assist        Transfers Overall transfer level: Needs assistance Equipment used: Rolling walker (2 wheeled) Transfers: Sit to/from Stand           General transfer comment: heavy  L lateral lean/LOB with movement transitions  Ambulation/Gait Ambulation/Gait assistance: Mod assist;+2 safety/equipment Gait Distance (Feet): 50 Feet Assistive device: Rolling walker (2 wheeled)       General Gait Details: very heavy L lateral lean/LOB;   Stairs             Wheelchair Mobility    Modified Rankin (Stroke Patients Only)       Balance Overall balance assessment: Needs assistance Sitting-balance support: No upper extremity supported;Feet supported Sitting balance-Leahy Scale:  Fair     Standing balance support: Bilateral upper extremity supported Standing balance-Leahy Scale: Zero                              Cognition Arousal/Alertness: Awake/alert Behavior During Therapy: WFL for tasks assessed/performed Overall Cognitive Status: No family/caregiver present to determine baseline cognitive functioning                                 General Comments: absent insight/awareness of deficits      Exercises      General Comments        Pertinent Vitals/Pain Pain Assessment: No/denies pain    Home Living                      Prior Function            PT Goals (current goals can now be found in the care plan section) Acute Rehab PT Goals Patient Stated Goal: To return home PT Goal Formulation: With patient Time For Goal Achievement: 05/17/19 Potential to Achieve Goals: Fair Progress towards PT goals: Progressing toward goals    Frequency    7X/week      PT Plan Current plan remains appropriate    Co-evaluation              AM-PAC PT "6 Clicks" Mobility   Outcome Measure  Help needed turning from your back to your side while in a flat bed without using bedrails?: A Lot Help needed  moving from lying on your back to sitting on the side of a flat bed without using bedrails?: A Lot Help needed moving to and from a bed to a chair (including a wheelchair)?: A Lot Help needed standing up from a chair using your arms (e.g., wheelchair or bedside chair)?: A Lot Help needed to walk in hospital room?: A Lot Help needed climbing 3-5 steps with a railing? : Total 6 Click Score: 11    End of Session Equipment Utilized During Treatment: Gait belt Activity Tolerance: Patient tolerated treatment well Patient left: in chair;with call bell/phone within reach;with chair alarm set   PT Visit Diagnosis: Unsteadiness on feet (R26.81);Dizziness and giddiness (R42);Muscle weakness (generalized) (M62.81);Other  abnormalities of gait and mobility (R26.89);Difficulty in walking, not elsewhere classified (R26.2);Other symptoms and signs involving the nervous system (P10.258)     Time: 5277-8242 PT Time Calculation (min) (ACUTE ONLY): 30 min  Charges:  $Gait Training: 8-22 mins $Therapeutic Activity: 8-22 mins                     Margery Szostak H. Owens Shark, PT, DPT, NCS 05/04/19, 10:22 PM [phone]

## 2019-05-05 DIAGNOSIS — I779 Disorder of arteries and arterioles, unspecified: Secondary | ICD-10-CM | POA: Diagnosis not present

## 2019-05-05 DIAGNOSIS — R531 Weakness: Secondary | ICD-10-CM | POA: Diagnosis not present

## 2019-05-05 DIAGNOSIS — I2510 Atherosclerotic heart disease of native coronary artery without angina pectoris: Secondary | ICD-10-CM | POA: Diagnosis not present

## 2019-06-01 DIAGNOSIS — M519 Unspecified thoracic, thoracolumbar and lumbosacral intervertebral disc disorder: Secondary | ICD-10-CM | POA: Diagnosis not present

## 2019-06-01 DIAGNOSIS — J449 Chronic obstructive pulmonary disease, unspecified: Secondary | ICD-10-CM | POA: Diagnosis not present

## 2019-06-01 DIAGNOSIS — M1990 Unspecified osteoarthritis, unspecified site: Secondary | ICD-10-CM | POA: Diagnosis not present

## 2019-06-01 DIAGNOSIS — M6281 Muscle weakness (generalized): Secondary | ICD-10-CM | POA: Diagnosis not present

## 2019-06-01 DIAGNOSIS — I5022 Chronic systolic (congestive) heart failure: Secondary | ICD-10-CM | POA: Diagnosis not present

## 2019-06-01 DIAGNOSIS — I69398 Other sequelae of cerebral infarction: Secondary | ICD-10-CM | POA: Diagnosis not present

## 2019-06-01 DIAGNOSIS — R2689 Other abnormalities of gait and mobility: Secondary | ICD-10-CM | POA: Diagnosis not present

## 2019-06-01 DIAGNOSIS — I110 Hypertensive heart disease with heart failure: Secondary | ICD-10-CM | POA: Diagnosis not present

## 2019-06-01 DIAGNOSIS — I69322 Dysarthria following cerebral infarction: Secondary | ICD-10-CM | POA: Diagnosis not present

## 2019-06-07 DIAGNOSIS — R2689 Other abnormalities of gait and mobility: Secondary | ICD-10-CM | POA: Diagnosis not present

## 2019-06-07 DIAGNOSIS — I5022 Chronic systolic (congestive) heart failure: Secondary | ICD-10-CM | POA: Diagnosis not present

## 2019-06-07 DIAGNOSIS — I110 Hypertensive heart disease with heart failure: Secondary | ICD-10-CM | POA: Diagnosis not present

## 2019-06-07 DIAGNOSIS — M1990 Unspecified osteoarthritis, unspecified site: Secondary | ICD-10-CM | POA: Diagnosis not present

## 2019-06-07 DIAGNOSIS — I69398 Other sequelae of cerebral infarction: Secondary | ICD-10-CM | POA: Diagnosis not present

## 2019-06-07 DIAGNOSIS — J449 Chronic obstructive pulmonary disease, unspecified: Secondary | ICD-10-CM | POA: Diagnosis not present

## 2019-06-07 DIAGNOSIS — I69322 Dysarthria following cerebral infarction: Secondary | ICD-10-CM | POA: Diagnosis not present

## 2019-06-07 DIAGNOSIS — M519 Unspecified thoracic, thoracolumbar and lumbosacral intervertebral disc disorder: Secondary | ICD-10-CM | POA: Diagnosis not present

## 2019-06-07 DIAGNOSIS — M6281 Muscle weakness (generalized): Secondary | ICD-10-CM | POA: Diagnosis not present

## 2019-06-08 DIAGNOSIS — M1990 Unspecified osteoarthritis, unspecified site: Secondary | ICD-10-CM | POA: Diagnosis not present

## 2019-06-08 DIAGNOSIS — I69398 Other sequelae of cerebral infarction: Secondary | ICD-10-CM | POA: Diagnosis not present

## 2019-06-08 DIAGNOSIS — M519 Unspecified thoracic, thoracolumbar and lumbosacral intervertebral disc disorder: Secondary | ICD-10-CM | POA: Diagnosis not present

## 2019-06-08 DIAGNOSIS — I110 Hypertensive heart disease with heart failure: Secondary | ICD-10-CM | POA: Diagnosis not present

## 2019-06-08 DIAGNOSIS — I5022 Chronic systolic (congestive) heart failure: Secondary | ICD-10-CM | POA: Diagnosis not present

## 2019-06-08 DIAGNOSIS — I69322 Dysarthria following cerebral infarction: Secondary | ICD-10-CM | POA: Diagnosis not present

## 2019-06-08 DIAGNOSIS — R2689 Other abnormalities of gait and mobility: Secondary | ICD-10-CM | POA: Diagnosis not present

## 2019-06-08 DIAGNOSIS — J449 Chronic obstructive pulmonary disease, unspecified: Secondary | ICD-10-CM | POA: Diagnosis not present

## 2019-06-08 DIAGNOSIS — M6281 Muscle weakness (generalized): Secondary | ICD-10-CM | POA: Diagnosis not present

## 2019-06-09 DIAGNOSIS — M519 Unspecified thoracic, thoracolumbar and lumbosacral intervertebral disc disorder: Secondary | ICD-10-CM | POA: Diagnosis not present

## 2019-06-09 DIAGNOSIS — J449 Chronic obstructive pulmonary disease, unspecified: Secondary | ICD-10-CM | POA: Diagnosis not present

## 2019-06-09 DIAGNOSIS — I69322 Dysarthria following cerebral infarction: Secondary | ICD-10-CM | POA: Diagnosis not present

## 2019-06-09 DIAGNOSIS — I5022 Chronic systolic (congestive) heart failure: Secondary | ICD-10-CM | POA: Diagnosis not present

## 2019-06-09 DIAGNOSIS — R2689 Other abnormalities of gait and mobility: Secondary | ICD-10-CM | POA: Diagnosis not present

## 2019-06-09 DIAGNOSIS — I69398 Other sequelae of cerebral infarction: Secondary | ICD-10-CM | POA: Diagnosis not present

## 2019-06-09 DIAGNOSIS — M6281 Muscle weakness (generalized): Secondary | ICD-10-CM | POA: Diagnosis not present

## 2019-06-09 DIAGNOSIS — M1990 Unspecified osteoarthritis, unspecified site: Secondary | ICD-10-CM | POA: Diagnosis not present

## 2019-06-09 DIAGNOSIS — I110 Hypertensive heart disease with heart failure: Secondary | ICD-10-CM | POA: Diagnosis not present

## 2019-06-10 DIAGNOSIS — R8290 Unspecified abnormal findings in urine: Secondary | ICD-10-CM | POA: Diagnosis not present

## 2019-06-10 DIAGNOSIS — I10 Essential (primary) hypertension: Secondary | ICD-10-CM | POA: Diagnosis not present

## 2019-06-13 DIAGNOSIS — I69398 Other sequelae of cerebral infarction: Secondary | ICD-10-CM | POA: Diagnosis not present

## 2019-06-13 DIAGNOSIS — M6281 Muscle weakness (generalized): Secondary | ICD-10-CM | POA: Diagnosis not present

## 2019-06-13 DIAGNOSIS — I5022 Chronic systolic (congestive) heart failure: Secondary | ICD-10-CM | POA: Diagnosis not present

## 2019-06-13 DIAGNOSIS — I110 Hypertensive heart disease with heart failure: Secondary | ICD-10-CM | POA: Diagnosis not present

## 2019-06-13 DIAGNOSIS — M1990 Unspecified osteoarthritis, unspecified site: Secondary | ICD-10-CM | POA: Diagnosis not present

## 2019-06-13 DIAGNOSIS — R2689 Other abnormalities of gait and mobility: Secondary | ICD-10-CM | POA: Diagnosis not present

## 2019-06-13 DIAGNOSIS — J449 Chronic obstructive pulmonary disease, unspecified: Secondary | ICD-10-CM | POA: Diagnosis not present

## 2019-06-13 DIAGNOSIS — M519 Unspecified thoracic, thoracolumbar and lumbosacral intervertebral disc disorder: Secondary | ICD-10-CM | POA: Diagnosis not present

## 2019-06-13 DIAGNOSIS — I69322 Dysarthria following cerebral infarction: Secondary | ICD-10-CM | POA: Diagnosis not present

## 2019-06-14 DIAGNOSIS — R2689 Other abnormalities of gait and mobility: Secondary | ICD-10-CM | POA: Diagnosis not present

## 2019-06-14 DIAGNOSIS — I5022 Chronic systolic (congestive) heart failure: Secondary | ICD-10-CM | POA: Diagnosis not present

## 2019-06-14 DIAGNOSIS — M1990 Unspecified osteoarthritis, unspecified site: Secondary | ICD-10-CM | POA: Diagnosis not present

## 2019-06-14 DIAGNOSIS — I110 Hypertensive heart disease with heart failure: Secondary | ICD-10-CM | POA: Diagnosis not present

## 2019-06-14 DIAGNOSIS — J449 Chronic obstructive pulmonary disease, unspecified: Secondary | ICD-10-CM | POA: Diagnosis not present

## 2019-06-14 DIAGNOSIS — I442 Atrioventricular block, complete: Secondary | ICD-10-CM | POA: Diagnosis not present

## 2019-06-14 DIAGNOSIS — M6281 Muscle weakness (generalized): Secondary | ICD-10-CM | POA: Diagnosis not present

## 2019-06-14 DIAGNOSIS — M519 Unspecified thoracic, thoracolumbar and lumbosacral intervertebral disc disorder: Secondary | ICD-10-CM | POA: Diagnosis not present

## 2019-06-14 DIAGNOSIS — I69398 Other sequelae of cerebral infarction: Secondary | ICD-10-CM | POA: Diagnosis not present

## 2019-06-14 DIAGNOSIS — I69322 Dysarthria following cerebral infarction: Secondary | ICD-10-CM | POA: Diagnosis not present

## 2019-06-15 DIAGNOSIS — M6281 Muscle weakness (generalized): Secondary | ICD-10-CM | POA: Diagnosis not present

## 2019-06-15 DIAGNOSIS — I110 Hypertensive heart disease with heart failure: Secondary | ICD-10-CM | POA: Diagnosis not present

## 2019-06-15 DIAGNOSIS — I69398 Other sequelae of cerebral infarction: Secondary | ICD-10-CM | POA: Diagnosis not present

## 2019-06-15 DIAGNOSIS — J449 Chronic obstructive pulmonary disease, unspecified: Secondary | ICD-10-CM | POA: Diagnosis not present

## 2019-06-15 DIAGNOSIS — M519 Unspecified thoracic, thoracolumbar and lumbosacral intervertebral disc disorder: Secondary | ICD-10-CM | POA: Diagnosis not present

## 2019-06-15 DIAGNOSIS — I69322 Dysarthria following cerebral infarction: Secondary | ICD-10-CM | POA: Diagnosis not present

## 2019-06-15 DIAGNOSIS — I5022 Chronic systolic (congestive) heart failure: Secondary | ICD-10-CM | POA: Diagnosis not present

## 2019-06-15 DIAGNOSIS — R2689 Other abnormalities of gait and mobility: Secondary | ICD-10-CM | POA: Diagnosis not present

## 2019-06-15 DIAGNOSIS — M1990 Unspecified osteoarthritis, unspecified site: Secondary | ICD-10-CM | POA: Diagnosis not present

## 2019-06-16 DIAGNOSIS — I69398 Other sequelae of cerebral infarction: Secondary | ICD-10-CM | POA: Diagnosis not present

## 2019-06-16 DIAGNOSIS — M6281 Muscle weakness (generalized): Secondary | ICD-10-CM | POA: Diagnosis not present

## 2019-06-16 DIAGNOSIS — I69322 Dysarthria following cerebral infarction: Secondary | ICD-10-CM | POA: Diagnosis not present

## 2019-06-16 DIAGNOSIS — M1990 Unspecified osteoarthritis, unspecified site: Secondary | ICD-10-CM | POA: Diagnosis not present

## 2019-06-16 DIAGNOSIS — J449 Chronic obstructive pulmonary disease, unspecified: Secondary | ICD-10-CM | POA: Diagnosis not present

## 2019-06-16 DIAGNOSIS — I5022 Chronic systolic (congestive) heart failure: Secondary | ICD-10-CM | POA: Diagnosis not present

## 2019-06-16 DIAGNOSIS — R2689 Other abnormalities of gait and mobility: Secondary | ICD-10-CM | POA: Diagnosis not present

## 2019-06-16 DIAGNOSIS — M519 Unspecified thoracic, thoracolumbar and lumbosacral intervertebral disc disorder: Secondary | ICD-10-CM | POA: Diagnosis not present

## 2019-06-16 DIAGNOSIS — I110 Hypertensive heart disease with heart failure: Secondary | ICD-10-CM | POA: Diagnosis not present

## 2019-06-17 DIAGNOSIS — J449 Chronic obstructive pulmonary disease, unspecified: Secondary | ICD-10-CM | POA: Diagnosis not present

## 2019-06-17 DIAGNOSIS — I2510 Atherosclerotic heart disease of native coronary artery without angina pectoris: Secondary | ICD-10-CM | POA: Diagnosis not present

## 2019-06-17 DIAGNOSIS — I10 Essential (primary) hypertension: Secondary | ICD-10-CM | POA: Diagnosis not present

## 2019-06-17 DIAGNOSIS — E782 Mixed hyperlipidemia: Secondary | ICD-10-CM | POA: Diagnosis not present

## 2019-06-17 DIAGNOSIS — I5022 Chronic systolic (congestive) heart failure: Secondary | ICD-10-CM | POA: Diagnosis not present

## 2019-06-17 DIAGNOSIS — R4702 Dysphasia: Secondary | ICD-10-CM | POA: Diagnosis not present

## 2019-06-20 DIAGNOSIS — J449 Chronic obstructive pulmonary disease, unspecified: Secondary | ICD-10-CM | POA: Diagnosis not present

## 2019-06-20 DIAGNOSIS — M519 Unspecified thoracic, thoracolumbar and lumbosacral intervertebral disc disorder: Secondary | ICD-10-CM | POA: Diagnosis not present

## 2019-06-20 DIAGNOSIS — I110 Hypertensive heart disease with heart failure: Secondary | ICD-10-CM | POA: Diagnosis not present

## 2019-06-20 DIAGNOSIS — I5022 Chronic systolic (congestive) heart failure: Secondary | ICD-10-CM | POA: Diagnosis not present

## 2019-06-20 DIAGNOSIS — I69398 Other sequelae of cerebral infarction: Secondary | ICD-10-CM | POA: Diagnosis not present

## 2019-06-20 DIAGNOSIS — M6281 Muscle weakness (generalized): Secondary | ICD-10-CM | POA: Diagnosis not present

## 2019-06-20 DIAGNOSIS — M1990 Unspecified osteoarthritis, unspecified site: Secondary | ICD-10-CM | POA: Diagnosis not present

## 2019-06-20 DIAGNOSIS — R2689 Other abnormalities of gait and mobility: Secondary | ICD-10-CM | POA: Diagnosis not present

## 2019-06-20 DIAGNOSIS — I69322 Dysarthria following cerebral infarction: Secondary | ICD-10-CM | POA: Diagnosis not present

## 2019-06-21 DIAGNOSIS — M1990 Unspecified osteoarthritis, unspecified site: Secondary | ICD-10-CM | POA: Diagnosis not present

## 2019-06-21 DIAGNOSIS — I69398 Other sequelae of cerebral infarction: Secondary | ICD-10-CM | POA: Diagnosis not present

## 2019-06-21 DIAGNOSIS — I5022 Chronic systolic (congestive) heart failure: Secondary | ICD-10-CM | POA: Diagnosis not present

## 2019-06-21 DIAGNOSIS — J449 Chronic obstructive pulmonary disease, unspecified: Secondary | ICD-10-CM | POA: Diagnosis not present

## 2019-06-21 DIAGNOSIS — R2689 Other abnormalities of gait and mobility: Secondary | ICD-10-CM | POA: Diagnosis not present

## 2019-06-21 DIAGNOSIS — I110 Hypertensive heart disease with heart failure: Secondary | ICD-10-CM | POA: Diagnosis not present

## 2019-06-21 DIAGNOSIS — M6281 Muscle weakness (generalized): Secondary | ICD-10-CM | POA: Diagnosis not present

## 2019-06-21 DIAGNOSIS — I69322 Dysarthria following cerebral infarction: Secondary | ICD-10-CM | POA: Diagnosis not present

## 2019-06-21 DIAGNOSIS — M519 Unspecified thoracic, thoracolumbar and lumbosacral intervertebral disc disorder: Secondary | ICD-10-CM | POA: Diagnosis not present

## 2019-06-23 DIAGNOSIS — H401131 Primary open-angle glaucoma, bilateral, mild stage: Secondary | ICD-10-CM | POA: Diagnosis not present

## 2019-06-24 DIAGNOSIS — I110 Hypertensive heart disease with heart failure: Secondary | ICD-10-CM | POA: Diagnosis not present

## 2019-06-24 DIAGNOSIS — I69322 Dysarthria following cerebral infarction: Secondary | ICD-10-CM | POA: Diagnosis not present

## 2019-06-24 DIAGNOSIS — I69398 Other sequelae of cerebral infarction: Secondary | ICD-10-CM | POA: Diagnosis not present

## 2019-06-24 DIAGNOSIS — M6281 Muscle weakness (generalized): Secondary | ICD-10-CM | POA: Diagnosis not present

## 2019-06-24 DIAGNOSIS — R2689 Other abnormalities of gait and mobility: Secondary | ICD-10-CM | POA: Diagnosis not present

## 2019-06-24 DIAGNOSIS — I5022 Chronic systolic (congestive) heart failure: Secondary | ICD-10-CM | POA: Diagnosis not present

## 2019-06-24 DIAGNOSIS — J449 Chronic obstructive pulmonary disease, unspecified: Secondary | ICD-10-CM | POA: Diagnosis not present

## 2019-06-24 DIAGNOSIS — M519 Unspecified thoracic, thoracolumbar and lumbosacral intervertebral disc disorder: Secondary | ICD-10-CM | POA: Diagnosis not present

## 2019-06-24 DIAGNOSIS — M1990 Unspecified osteoarthritis, unspecified site: Secondary | ICD-10-CM | POA: Diagnosis not present

## 2019-06-27 DIAGNOSIS — I69398 Other sequelae of cerebral infarction: Secondary | ICD-10-CM | POA: Diagnosis not present

## 2019-06-27 DIAGNOSIS — U071 COVID-19: Secondary | ICD-10-CM | POA: Diagnosis not present

## 2019-06-27 DIAGNOSIS — M6281 Muscle weakness (generalized): Secondary | ICD-10-CM | POA: Diagnosis not present

## 2019-06-27 DIAGNOSIS — I5022 Chronic systolic (congestive) heart failure: Secondary | ICD-10-CM | POA: Diagnosis not present

## 2019-06-27 DIAGNOSIS — M1990 Unspecified osteoarthritis, unspecified site: Secondary | ICD-10-CM | POA: Diagnosis not present

## 2019-06-27 DIAGNOSIS — M519 Unspecified thoracic, thoracolumbar and lumbosacral intervertebral disc disorder: Secondary | ICD-10-CM | POA: Diagnosis not present

## 2019-06-27 DIAGNOSIS — I110 Hypertensive heart disease with heart failure: Secondary | ICD-10-CM | POA: Diagnosis not present

## 2019-06-27 DIAGNOSIS — J449 Chronic obstructive pulmonary disease, unspecified: Secondary | ICD-10-CM | POA: Diagnosis not present

## 2019-06-27 DIAGNOSIS — I69322 Dysarthria following cerebral infarction: Secondary | ICD-10-CM | POA: Diagnosis not present

## 2019-06-27 DIAGNOSIS — R2689 Other abnormalities of gait and mobility: Secondary | ICD-10-CM | POA: Diagnosis not present

## 2019-06-28 DIAGNOSIS — R42 Dizziness and giddiness: Secondary | ICD-10-CM | POA: Diagnosis not present

## 2019-06-28 DIAGNOSIS — H903 Sensorineural hearing loss, bilateral: Secondary | ICD-10-CM | POA: Diagnosis not present

## 2019-06-28 DIAGNOSIS — H6121 Impacted cerumen, right ear: Secondary | ICD-10-CM | POA: Diagnosis not present

## 2019-06-29 DIAGNOSIS — I69398 Other sequelae of cerebral infarction: Secondary | ICD-10-CM | POA: Diagnosis not present

## 2019-06-29 DIAGNOSIS — M6281 Muscle weakness (generalized): Secondary | ICD-10-CM | POA: Diagnosis not present

## 2019-06-29 DIAGNOSIS — R2689 Other abnormalities of gait and mobility: Secondary | ICD-10-CM | POA: Diagnosis not present

## 2019-06-29 DIAGNOSIS — I69322 Dysarthria following cerebral infarction: Secondary | ICD-10-CM | POA: Diagnosis not present

## 2019-06-29 DIAGNOSIS — M1990 Unspecified osteoarthritis, unspecified site: Secondary | ICD-10-CM | POA: Diagnosis not present

## 2019-06-29 DIAGNOSIS — J449 Chronic obstructive pulmonary disease, unspecified: Secondary | ICD-10-CM | POA: Diagnosis not present

## 2019-06-29 DIAGNOSIS — I5022 Chronic systolic (congestive) heart failure: Secondary | ICD-10-CM | POA: Diagnosis not present

## 2019-06-29 DIAGNOSIS — M519 Unspecified thoracic, thoracolumbar and lumbosacral intervertebral disc disorder: Secondary | ICD-10-CM | POA: Diagnosis not present

## 2019-06-29 DIAGNOSIS — I110 Hypertensive heart disease with heart failure: Secondary | ICD-10-CM | POA: Diagnosis not present

## 2019-07-01 DIAGNOSIS — R531 Weakness: Secondary | ICD-10-CM | POA: Diagnosis not present

## 2019-07-01 DIAGNOSIS — Z950 Presence of cardiac pacemaker: Secondary | ICD-10-CM | POA: Diagnosis not present

## 2019-07-01 DIAGNOSIS — I442 Atrioventricular block, complete: Secondary | ICD-10-CM | POA: Diagnosis not present

## 2019-07-04 DIAGNOSIS — R2689 Other abnormalities of gait and mobility: Secondary | ICD-10-CM | POA: Diagnosis not present

## 2019-07-04 DIAGNOSIS — M1990 Unspecified osteoarthritis, unspecified site: Secondary | ICD-10-CM | POA: Diagnosis not present

## 2019-07-04 DIAGNOSIS — I69398 Other sequelae of cerebral infarction: Secondary | ICD-10-CM | POA: Diagnosis not present

## 2019-07-04 DIAGNOSIS — M6281 Muscle weakness (generalized): Secondary | ICD-10-CM | POA: Diagnosis not present

## 2019-07-04 DIAGNOSIS — I110 Hypertensive heart disease with heart failure: Secondary | ICD-10-CM | POA: Diagnosis not present

## 2019-07-04 DIAGNOSIS — I69322 Dysarthria following cerebral infarction: Secondary | ICD-10-CM | POA: Diagnosis not present

## 2019-07-04 DIAGNOSIS — M519 Unspecified thoracic, thoracolumbar and lumbosacral intervertebral disc disorder: Secondary | ICD-10-CM | POA: Diagnosis not present

## 2019-07-04 DIAGNOSIS — I5022 Chronic systolic (congestive) heart failure: Secondary | ICD-10-CM | POA: Diagnosis not present

## 2019-07-04 DIAGNOSIS — J449 Chronic obstructive pulmonary disease, unspecified: Secondary | ICD-10-CM | POA: Diagnosis not present

## 2019-07-06 DIAGNOSIS — J449 Chronic obstructive pulmonary disease, unspecified: Secondary | ICD-10-CM | POA: Diagnosis not present

## 2019-07-06 DIAGNOSIS — M6281 Muscle weakness (generalized): Secondary | ICD-10-CM | POA: Diagnosis not present

## 2019-07-06 DIAGNOSIS — I110 Hypertensive heart disease with heart failure: Secondary | ICD-10-CM | POA: Diagnosis not present

## 2019-07-06 DIAGNOSIS — M519 Unspecified thoracic, thoracolumbar and lumbosacral intervertebral disc disorder: Secondary | ICD-10-CM | POA: Diagnosis not present

## 2019-07-06 DIAGNOSIS — I5022 Chronic systolic (congestive) heart failure: Secondary | ICD-10-CM | POA: Diagnosis not present

## 2019-07-06 DIAGNOSIS — I69398 Other sequelae of cerebral infarction: Secondary | ICD-10-CM | POA: Diagnosis not present

## 2019-07-06 DIAGNOSIS — R2689 Other abnormalities of gait and mobility: Secondary | ICD-10-CM | POA: Diagnosis not present

## 2019-07-06 DIAGNOSIS — I69322 Dysarthria following cerebral infarction: Secondary | ICD-10-CM | POA: Diagnosis not present

## 2019-07-06 DIAGNOSIS — M1990 Unspecified osteoarthritis, unspecified site: Secondary | ICD-10-CM | POA: Diagnosis not present

## 2019-07-07 DIAGNOSIS — M1990 Unspecified osteoarthritis, unspecified site: Secondary | ICD-10-CM | POA: Diagnosis not present

## 2019-07-07 DIAGNOSIS — I69398 Other sequelae of cerebral infarction: Secondary | ICD-10-CM | POA: Diagnosis not present

## 2019-07-07 DIAGNOSIS — M519 Unspecified thoracic, thoracolumbar and lumbosacral intervertebral disc disorder: Secondary | ICD-10-CM | POA: Diagnosis not present

## 2019-07-07 DIAGNOSIS — I110 Hypertensive heart disease with heart failure: Secondary | ICD-10-CM | POA: Diagnosis not present

## 2019-07-07 DIAGNOSIS — R2689 Other abnormalities of gait and mobility: Secondary | ICD-10-CM | POA: Diagnosis not present

## 2019-07-07 DIAGNOSIS — I69322 Dysarthria following cerebral infarction: Secondary | ICD-10-CM | POA: Diagnosis not present

## 2019-07-07 DIAGNOSIS — J449 Chronic obstructive pulmonary disease, unspecified: Secondary | ICD-10-CM | POA: Diagnosis not present

## 2019-07-07 DIAGNOSIS — M6281 Muscle weakness (generalized): Secondary | ICD-10-CM | POA: Diagnosis not present

## 2019-07-07 DIAGNOSIS — I5022 Chronic systolic (congestive) heart failure: Secondary | ICD-10-CM | POA: Diagnosis not present

## 2019-07-11 DIAGNOSIS — R2689 Other abnormalities of gait and mobility: Secondary | ICD-10-CM | POA: Diagnosis not present

## 2019-07-11 DIAGNOSIS — M6281 Muscle weakness (generalized): Secondary | ICD-10-CM | POA: Diagnosis not present

## 2019-07-11 DIAGNOSIS — I69398 Other sequelae of cerebral infarction: Secondary | ICD-10-CM | POA: Diagnosis not present

## 2019-07-11 DIAGNOSIS — I5022 Chronic systolic (congestive) heart failure: Secondary | ICD-10-CM | POA: Diagnosis not present

## 2019-07-11 DIAGNOSIS — M519 Unspecified thoracic, thoracolumbar and lumbosacral intervertebral disc disorder: Secondary | ICD-10-CM | POA: Diagnosis not present

## 2019-07-11 DIAGNOSIS — M1990 Unspecified osteoarthritis, unspecified site: Secondary | ICD-10-CM | POA: Diagnosis not present

## 2019-07-11 DIAGNOSIS — I69322 Dysarthria following cerebral infarction: Secondary | ICD-10-CM | POA: Diagnosis not present

## 2019-07-11 DIAGNOSIS — J449 Chronic obstructive pulmonary disease, unspecified: Secondary | ICD-10-CM | POA: Diagnosis not present

## 2019-07-11 DIAGNOSIS — I110 Hypertensive heart disease with heart failure: Secondary | ICD-10-CM | POA: Diagnosis not present

## 2019-07-15 DIAGNOSIS — I110 Hypertensive heart disease with heart failure: Secondary | ICD-10-CM | POA: Diagnosis not present

## 2019-07-15 DIAGNOSIS — J449 Chronic obstructive pulmonary disease, unspecified: Secondary | ICD-10-CM | POA: Diagnosis not present

## 2019-07-15 DIAGNOSIS — I69393 Ataxia following cerebral infarction: Secondary | ICD-10-CM | POA: Diagnosis not present

## 2019-07-15 DIAGNOSIS — M519 Unspecified thoracic, thoracolumbar and lumbosacral intervertebral disc disorder: Secondary | ICD-10-CM | POA: Diagnosis not present

## 2019-07-15 DIAGNOSIS — I6523 Occlusion and stenosis of bilateral carotid arteries: Secondary | ICD-10-CM | POA: Diagnosis not present

## 2019-07-15 DIAGNOSIS — I5022 Chronic systolic (congestive) heart failure: Secondary | ICD-10-CM | POA: Diagnosis not present

## 2019-07-15 DIAGNOSIS — M1990 Unspecified osteoarthritis, unspecified site: Secondary | ICD-10-CM | POA: Diagnosis not present

## 2019-07-15 DIAGNOSIS — R2689 Other abnormalities of gait and mobility: Secondary | ICD-10-CM | POA: Diagnosis not present

## 2019-07-15 DIAGNOSIS — Z8673 Personal history of transient ischemic attack (TIA), and cerebral infarction without residual deficits: Secondary | ICD-10-CM | POA: Diagnosis not present

## 2019-07-15 DIAGNOSIS — I69322 Dysarthria following cerebral infarction: Secondary | ICD-10-CM | POA: Diagnosis not present

## 2019-07-15 DIAGNOSIS — M6281 Muscle weakness (generalized): Secondary | ICD-10-CM | POA: Diagnosis not present

## 2019-07-15 DIAGNOSIS — I69398 Other sequelae of cerebral infarction: Secondary | ICD-10-CM | POA: Diagnosis not present

## 2019-07-18 DIAGNOSIS — M6281 Muscle weakness (generalized): Secondary | ICD-10-CM | POA: Diagnosis not present

## 2019-07-18 DIAGNOSIS — M519 Unspecified thoracic, thoracolumbar and lumbosacral intervertebral disc disorder: Secondary | ICD-10-CM | POA: Diagnosis not present

## 2019-07-18 DIAGNOSIS — I110 Hypertensive heart disease with heart failure: Secondary | ICD-10-CM | POA: Diagnosis not present

## 2019-07-18 DIAGNOSIS — J449 Chronic obstructive pulmonary disease, unspecified: Secondary | ICD-10-CM | POA: Diagnosis not present

## 2019-07-18 DIAGNOSIS — M1990 Unspecified osteoarthritis, unspecified site: Secondary | ICD-10-CM | POA: Diagnosis not present

## 2019-07-18 DIAGNOSIS — I5022 Chronic systolic (congestive) heart failure: Secondary | ICD-10-CM | POA: Diagnosis not present

## 2019-07-18 DIAGNOSIS — R2689 Other abnormalities of gait and mobility: Secondary | ICD-10-CM | POA: Diagnosis not present

## 2019-07-18 DIAGNOSIS — I69398 Other sequelae of cerebral infarction: Secondary | ICD-10-CM | POA: Diagnosis not present

## 2019-07-18 DIAGNOSIS — I69322 Dysarthria following cerebral infarction: Secondary | ICD-10-CM | POA: Diagnosis not present

## 2019-07-20 DIAGNOSIS — M6281 Muscle weakness (generalized): Secondary | ICD-10-CM | POA: Diagnosis not present

## 2019-07-20 DIAGNOSIS — I5022 Chronic systolic (congestive) heart failure: Secondary | ICD-10-CM | POA: Diagnosis not present

## 2019-07-20 DIAGNOSIS — I110 Hypertensive heart disease with heart failure: Secondary | ICD-10-CM | POA: Diagnosis not present

## 2019-07-20 DIAGNOSIS — M1990 Unspecified osteoarthritis, unspecified site: Secondary | ICD-10-CM | POA: Diagnosis not present

## 2019-07-20 DIAGNOSIS — J449 Chronic obstructive pulmonary disease, unspecified: Secondary | ICD-10-CM | POA: Diagnosis not present

## 2019-07-20 DIAGNOSIS — I69322 Dysarthria following cerebral infarction: Secondary | ICD-10-CM | POA: Diagnosis not present

## 2019-07-20 DIAGNOSIS — M519 Unspecified thoracic, thoracolumbar and lumbosacral intervertebral disc disorder: Secondary | ICD-10-CM | POA: Diagnosis not present

## 2019-07-20 DIAGNOSIS — R2689 Other abnormalities of gait and mobility: Secondary | ICD-10-CM | POA: Diagnosis not present

## 2019-07-20 DIAGNOSIS — I69398 Other sequelae of cerebral infarction: Secondary | ICD-10-CM | POA: Diagnosis not present

## 2019-07-25 DIAGNOSIS — M6281 Muscle weakness (generalized): Secondary | ICD-10-CM | POA: Diagnosis not present

## 2019-07-25 DIAGNOSIS — J449 Chronic obstructive pulmonary disease, unspecified: Secondary | ICD-10-CM | POA: Diagnosis not present

## 2019-07-25 DIAGNOSIS — I69322 Dysarthria following cerebral infarction: Secondary | ICD-10-CM | POA: Diagnosis not present

## 2019-07-25 DIAGNOSIS — I5022 Chronic systolic (congestive) heart failure: Secondary | ICD-10-CM | POA: Diagnosis not present

## 2019-07-25 DIAGNOSIS — R2689 Other abnormalities of gait and mobility: Secondary | ICD-10-CM | POA: Diagnosis not present

## 2019-07-25 DIAGNOSIS — I69398 Other sequelae of cerebral infarction: Secondary | ICD-10-CM | POA: Diagnosis not present

## 2019-07-25 DIAGNOSIS — M1990 Unspecified osteoarthritis, unspecified site: Secondary | ICD-10-CM | POA: Diagnosis not present

## 2019-07-25 DIAGNOSIS — I110 Hypertensive heart disease with heart failure: Secondary | ICD-10-CM | POA: Diagnosis not present

## 2019-07-25 DIAGNOSIS — M519 Unspecified thoracic, thoracolumbar and lumbosacral intervertebral disc disorder: Secondary | ICD-10-CM | POA: Diagnosis not present

## 2019-07-28 DIAGNOSIS — J449 Chronic obstructive pulmonary disease, unspecified: Secondary | ICD-10-CM | POA: Diagnosis not present

## 2019-07-28 DIAGNOSIS — I5022 Chronic systolic (congestive) heart failure: Secondary | ICD-10-CM | POA: Diagnosis not present

## 2019-07-28 DIAGNOSIS — B0233 Zoster keratitis: Secondary | ICD-10-CM | POA: Diagnosis not present

## 2019-07-28 DIAGNOSIS — U071 COVID-19: Secondary | ICD-10-CM | POA: Diagnosis not present

## 2019-07-28 DIAGNOSIS — M6281 Muscle weakness (generalized): Secondary | ICD-10-CM | POA: Diagnosis not present

## 2019-07-29 DIAGNOSIS — R2689 Other abnormalities of gait and mobility: Secondary | ICD-10-CM | POA: Diagnosis not present

## 2019-07-29 DIAGNOSIS — I110 Hypertensive heart disease with heart failure: Secondary | ICD-10-CM | POA: Diagnosis not present

## 2019-07-29 DIAGNOSIS — I69322 Dysarthria following cerebral infarction: Secondary | ICD-10-CM | POA: Diagnosis not present

## 2019-07-29 DIAGNOSIS — J449 Chronic obstructive pulmonary disease, unspecified: Secondary | ICD-10-CM | POA: Diagnosis not present

## 2019-07-29 DIAGNOSIS — M519 Unspecified thoracic, thoracolumbar and lumbosacral intervertebral disc disorder: Secondary | ICD-10-CM | POA: Diagnosis not present

## 2019-07-29 DIAGNOSIS — M6281 Muscle weakness (generalized): Secondary | ICD-10-CM | POA: Diagnosis not present

## 2019-07-29 DIAGNOSIS — I69398 Other sequelae of cerebral infarction: Secondary | ICD-10-CM | POA: Diagnosis not present

## 2019-07-29 DIAGNOSIS — M1990 Unspecified osteoarthritis, unspecified site: Secondary | ICD-10-CM | POA: Diagnosis not present

## 2019-07-29 DIAGNOSIS — I5022 Chronic systolic (congestive) heart failure: Secondary | ICD-10-CM | POA: Diagnosis not present

## 2019-08-01 DIAGNOSIS — R05 Cough: Secondary | ICD-10-CM | POA: Diagnosis not present

## 2019-08-01 DIAGNOSIS — J45998 Other asthma: Secondary | ICD-10-CM | POA: Diagnosis not present

## 2019-08-01 DIAGNOSIS — R0602 Shortness of breath: Secondary | ICD-10-CM | POA: Diagnosis not present

## 2019-08-26 DIAGNOSIS — I442 Atrioventricular block, complete: Secondary | ICD-10-CM | POA: Diagnosis not present

## 2019-08-26 DIAGNOSIS — I2510 Atherosclerotic heart disease of native coronary artery without angina pectoris: Secondary | ICD-10-CM | POA: Diagnosis not present

## 2019-08-26 DIAGNOSIS — I6523 Occlusion and stenosis of bilateral carotid arteries: Secondary | ICD-10-CM | POA: Diagnosis not present

## 2019-08-26 DIAGNOSIS — I10 Essential (primary) hypertension: Secondary | ICD-10-CM | POA: Diagnosis not present

## 2019-08-26 DIAGNOSIS — I5022 Chronic systolic (congestive) heart failure: Secondary | ICD-10-CM | POA: Diagnosis not present

## 2019-08-26 DIAGNOSIS — Z8673 Personal history of transient ischemic attack (TIA), and cerebral infarction without residual deficits: Secondary | ICD-10-CM | POA: Diagnosis not present

## 2019-08-27 DIAGNOSIS — U071 COVID-19: Secondary | ICD-10-CM | POA: Diagnosis not present

## 2019-08-27 DIAGNOSIS — M6281 Muscle weakness (generalized): Secondary | ICD-10-CM | POA: Diagnosis not present

## 2019-08-27 DIAGNOSIS — I5022 Chronic systolic (congestive) heart failure: Secondary | ICD-10-CM | POA: Diagnosis not present

## 2019-08-27 DIAGNOSIS — J449 Chronic obstructive pulmonary disease, unspecified: Secondary | ICD-10-CM | POA: Diagnosis not present

## 2019-08-29 DIAGNOSIS — B0233 Zoster keratitis: Secondary | ICD-10-CM | POA: Diagnosis not present

## 2019-09-08 DIAGNOSIS — I10 Essential (primary) hypertension: Secondary | ICD-10-CM | POA: Diagnosis not present

## 2019-09-12 DIAGNOSIS — B0233 Zoster keratitis: Secondary | ICD-10-CM | POA: Diagnosis not present

## 2019-09-15 DIAGNOSIS — I2510 Atherosclerotic heart disease of native coronary artery without angina pectoris: Secondary | ICD-10-CM | POA: Diagnosis not present

## 2019-09-15 DIAGNOSIS — I10 Essential (primary) hypertension: Secondary | ICD-10-CM | POA: Diagnosis not present

## 2019-09-15 DIAGNOSIS — G4700 Insomnia, unspecified: Secondary | ICD-10-CM | POA: Diagnosis not present

## 2019-09-15 DIAGNOSIS — Z87891 Personal history of nicotine dependence: Secondary | ICD-10-CM | POA: Diagnosis not present

## 2019-09-15 DIAGNOSIS — Z8673 Personal history of transient ischemic attack (TIA), and cerebral infarction without residual deficits: Secondary | ICD-10-CM | POA: Diagnosis not present

## 2019-09-15 DIAGNOSIS — J449 Chronic obstructive pulmonary disease, unspecified: Secondary | ICD-10-CM | POA: Diagnosis not present

## 2019-09-23 DIAGNOSIS — I69398 Other sequelae of cerebral infarction: Secondary | ICD-10-CM | POA: Diagnosis not present

## 2019-09-23 DIAGNOSIS — I10 Essential (primary) hypertension: Secondary | ICD-10-CM | POA: Diagnosis not present

## 2019-09-23 DIAGNOSIS — E559 Vitamin D deficiency, unspecified: Secondary | ICD-10-CM | POA: Diagnosis not present

## 2019-09-23 DIAGNOSIS — M1990 Unspecified osteoarthritis, unspecified site: Secondary | ICD-10-CM | POA: Diagnosis not present

## 2019-09-23 DIAGNOSIS — H409 Unspecified glaucoma: Secondary | ICD-10-CM | POA: Diagnosis not present

## 2019-09-23 DIAGNOSIS — F329 Major depressive disorder, single episode, unspecified: Secondary | ICD-10-CM | POA: Diagnosis not present

## 2019-09-23 DIAGNOSIS — I6529 Occlusion and stenosis of unspecified carotid artery: Secondary | ICD-10-CM | POA: Diagnosis not present

## 2019-09-23 DIAGNOSIS — R2689 Other abnormalities of gait and mobility: Secondary | ICD-10-CM | POA: Diagnosis not present

## 2019-09-23 DIAGNOSIS — J449 Chronic obstructive pulmonary disease, unspecified: Secondary | ICD-10-CM | POA: Diagnosis not present

## 2019-09-27 DIAGNOSIS — M1990 Unspecified osteoarthritis, unspecified site: Secondary | ICD-10-CM | POA: Diagnosis not present

## 2019-09-27 DIAGNOSIS — I5022 Chronic systolic (congestive) heart failure: Secondary | ICD-10-CM | POA: Diagnosis not present

## 2019-09-27 DIAGNOSIS — H409 Unspecified glaucoma: Secondary | ICD-10-CM | POA: Diagnosis not present

## 2019-09-27 DIAGNOSIS — I10 Essential (primary) hypertension: Secondary | ICD-10-CM | POA: Diagnosis not present

## 2019-09-27 DIAGNOSIS — U071 COVID-19: Secondary | ICD-10-CM | POA: Diagnosis not present

## 2019-09-27 DIAGNOSIS — J449 Chronic obstructive pulmonary disease, unspecified: Secondary | ICD-10-CM | POA: Diagnosis not present

## 2019-09-27 DIAGNOSIS — E559 Vitamin D deficiency, unspecified: Secondary | ICD-10-CM | POA: Diagnosis not present

## 2019-09-27 DIAGNOSIS — M6281 Muscle weakness (generalized): Secondary | ICD-10-CM | POA: Diagnosis not present

## 2019-09-27 DIAGNOSIS — R2689 Other abnormalities of gait and mobility: Secondary | ICD-10-CM | POA: Diagnosis not present

## 2019-09-27 DIAGNOSIS — I69398 Other sequelae of cerebral infarction: Secondary | ICD-10-CM | POA: Diagnosis not present

## 2019-09-27 DIAGNOSIS — I6529 Occlusion and stenosis of unspecified carotid artery: Secondary | ICD-10-CM | POA: Diagnosis not present

## 2019-09-27 DIAGNOSIS — F329 Major depressive disorder, single episode, unspecified: Secondary | ICD-10-CM | POA: Diagnosis not present

## 2019-09-28 DIAGNOSIS — B0233 Zoster keratitis: Secondary | ICD-10-CM | POA: Diagnosis not present

## 2019-09-29 ENCOUNTER — Other Ambulatory Visit: Payer: Self-pay

## 2019-09-29 ENCOUNTER — Inpatient Hospital Stay
Admission: EM | Admit: 2019-09-29 | Discharge: 2019-10-02 | DRG: 871 | Disposition: A | Discharge status: Home Health | Payer: Medicare HMO | Attending: Internal Medicine | Admitting: Internal Medicine

## 2019-09-29 ENCOUNTER — Emergency Department: Payer: Medicare HMO

## 2019-09-29 DIAGNOSIS — R5381 Other malaise: Secondary | ICD-10-CM | POA: Diagnosis not present

## 2019-09-29 DIAGNOSIS — I739 Peripheral vascular disease, unspecified: Secondary | ICD-10-CM | POA: Diagnosis present

## 2019-09-29 DIAGNOSIS — J181 Lobar pneumonia, unspecified organism: Secondary | ICD-10-CM

## 2019-09-29 DIAGNOSIS — Z8673 Personal history of transient ischemic attack (TIA), and cerebral infarction without residual deficits: Secondary | ICD-10-CM

## 2019-09-29 DIAGNOSIS — J449 Chronic obstructive pulmonary disease, unspecified: Secondary | ICD-10-CM

## 2019-09-29 DIAGNOSIS — I110 Hypertensive heart disease with heart failure: Secondary | ICD-10-CM | POA: Diagnosis present

## 2019-09-29 DIAGNOSIS — Z950 Presence of cardiac pacemaker: Secondary | ICD-10-CM

## 2019-09-29 DIAGNOSIS — Z8679 Personal history of other diseases of the circulatory system: Secondary | ICD-10-CM

## 2019-09-29 DIAGNOSIS — I5022 Chronic systolic (congestive) heart failure: Secondary | ICD-10-CM | POA: Diagnosis present

## 2019-09-29 DIAGNOSIS — I442 Atrioventricular block, complete: Secondary | ICD-10-CM | POA: Diagnosis present

## 2019-09-29 DIAGNOSIS — Z96651 Presence of right artificial knee joint: Secondary | ICD-10-CM | POA: Diagnosis present

## 2019-09-29 DIAGNOSIS — Z20828 Contact with and (suspected) exposure to other viral communicable diseases: Secondary | ICD-10-CM | POA: Diagnosis not present

## 2019-09-29 DIAGNOSIS — I2510 Atherosclerotic heart disease of native coronary artery without angina pectoris: Secondary | ICD-10-CM | POA: Diagnosis present

## 2019-09-29 DIAGNOSIS — Z7982 Long term (current) use of aspirin: Secondary | ICD-10-CM

## 2019-09-29 DIAGNOSIS — R079 Chest pain, unspecified: Secondary | ICD-10-CM

## 2019-09-29 DIAGNOSIS — J440 Chronic obstructive pulmonary disease with acute lower respiratory infection: Secondary | ICD-10-CM | POA: Diagnosis present

## 2019-09-29 DIAGNOSIS — R0689 Other abnormalities of breathing: Secondary | ICD-10-CM | POA: Diagnosis not present

## 2019-09-29 DIAGNOSIS — E785 Hyperlipidemia, unspecified: Secondary | ICD-10-CM

## 2019-09-29 DIAGNOSIS — N179 Acute kidney failure, unspecified: Secondary | ICD-10-CM | POA: Diagnosis present

## 2019-09-29 DIAGNOSIS — R509 Fever, unspecified: Secondary | ICD-10-CM

## 2019-09-29 DIAGNOSIS — R0602 Shortness of breath: Secondary | ICD-10-CM | POA: Diagnosis not present

## 2019-09-29 DIAGNOSIS — A419 Sepsis, unspecified organism: Secondary | ICD-10-CM | POA: Diagnosis not present

## 2019-09-29 DIAGNOSIS — R0902 Hypoxemia: Secondary | ICD-10-CM | POA: Diagnosis not present

## 2019-09-29 DIAGNOSIS — I10 Essential (primary) hypertension: Secondary | ICD-10-CM | POA: Diagnosis present

## 2019-09-29 DIAGNOSIS — R6520 Severe sepsis without septic shock: Secondary | ICD-10-CM | POA: Diagnosis present

## 2019-09-29 DIAGNOSIS — H409 Unspecified glaucoma: Secondary | ICD-10-CM | POA: Diagnosis present

## 2019-09-29 DIAGNOSIS — K219 Gastro-esophageal reflux disease without esophagitis: Secondary | ICD-10-CM | POA: Diagnosis present

## 2019-09-29 DIAGNOSIS — Z833 Family history of diabetes mellitus: Secondary | ICD-10-CM

## 2019-09-29 DIAGNOSIS — J189 Pneumonia, unspecified organism: Secondary | ICD-10-CM | POA: Diagnosis present

## 2019-09-29 DIAGNOSIS — I959 Hypotension, unspecified: Secondary | ICD-10-CM | POA: Diagnosis not present

## 2019-09-29 DIAGNOSIS — R05 Cough: Secondary | ICD-10-CM | POA: Diagnosis not present

## 2019-09-29 DIAGNOSIS — Z888 Allergy status to other drugs, medicaments and biological substances status: Secondary | ICD-10-CM

## 2019-09-29 MED ORDER — ACETAMINOPHEN 500 MG PO TABS
1000.0000 mg | ORAL_TABLET | Freq: Once | ORAL | Status: AC
  Administered 2019-09-30: 1000 mg via ORAL
  Filled 2019-09-29: qty 2

## 2019-09-29 NOTE — ED Triage Notes (Signed)
Pt arrives via [facility] from home for [facility], fever and cough x 4 days. Pt original O2 was 91% on RA, 99% on 6L . A&Ox4 in NAD at this time. Pt has a red rash under breasts, reports not painful. Pt has a pacemaker.

## 2019-09-29 NOTE — ED Provider Notes (Signed)
[facility] Emergency Department Provider Note   ____________________________________________   First MD Initiated Contact with Patient 09/29/19 2342     (approximate)  I have reviewed the triage vital signs and the nursing notes.   HISTORY  Chief Complaint Shortness of Breath    HPI Darrell Jacobson is a 85 y.o. male brought to the ED from home via EMS with a chief complaint of shortness of breath. History of COPD and CHF, not on oxygen. Patient reports fever, cough and sob x 4 days. Denies chest pain, abdominal pain, nausea, vomiting, diarrhea. Denies recent travel or COVID-19 exposure.      Past Medical History:  Diagnosis Date  . Arthritis   . Asthma    COPD with inhaler use  . Cancer [facility]) 2013   Basal Cell Skin CA resected from scalp.  . Carotid stenosis    pateint states he has Left carotid blockage worse than Right.   . CHF (congestive heart failure) ([place])   . Collagen vascular disease ([place])   . COPD (chronic obstructive pulmonary disease) ([place])   . Coronary artery disease   . Dyspnea   . GERD (gastroesophageal reflux disease)   . Glaucoma   . History of hiatal hernia   . History of shingles 2012   forehead  . Hypertension   . Multilevel degenerative disc disease   . Neuropathy    legs and feet  . Pacemaker 2006    Patient Active Problem List   Diagnosis Date Noted  . Community acquired pneumonia 09/30/2019  . Essential hypertension 09/30/2019  . Chronic systolic CHF (congestive heart failure) ([place]) 09/30/2019  . Complete heart block ([place]) 09/30/2019  . History of cerebrovascular disease 09/30/2019  . PVD (peripheral vascular disease) ([place]) 09/30/2019  . Asthma-COPD overlap syndrome ([place]) 09/30/2019  . TIA (transient ischemic attack) 05/03/2019    Past Surgical History:  Procedure Laterality Date  . BACK SURGERY     2 lumbar, 1 thoracic  . CATARACT EXTRACTION W/PHACO Right 12/24/2016   Procedure: CATARACT EXTRACTION  PHACO AND INTRAOCULAR LENS PLACEMENT ([place])  Right  complicated;  Surgeon: Leandrew Koyanagi, MD;  Location: [facility];  Service: Ophthalmology;  Laterality: Right;  [place]  . JOINT REPLACEMENT Right    knee  . SKIN DEBRIDEMENT  06/13/2006   facial  . SKIN GRAFT Right 07/11/2016   facial    Prior to Admission medications   Medication Sig Start Date End Date Taking? Authorizing Provider  acetaminophen (TYLENOL) 500 MG tablet Take 1 tablet by mouth as needed.   Yes [provider]  aspirin EC 81 MG tablet Take 1 tablet by mouth daily.   Yes [provider]  atorvastatin (LIPITOR) 40 MG tablet Take 1 tablet (40 mg total) by mouth daily at 6 PM. 05/04/19  Yes Sudini, Srikar, MD  bimatoprost (LUMIGAN) 0.01 % SOLN Apply 1 Dose to eye at bedtime. 01/16/14  Yes [provider]  Cholecalciferol (D 2000) 2000 UNITS TABS Take 1 tablet by mouth daily.   Yes [provider]  doxazosin (CARDURA) 8 MG tablet Take 1 tablet by mouth at bedtime. 06/21/15  Yes [provider]  Ferrous Sulfate (SLOW FE PO) Take by mouth.   Yes [provider]  fluticasone (FLONASE) 50 MCG/ACT nasal spray Place 2 sprays into the nose daily. 12/20/18 12/20/19 Yes [provider]  LORazepam (ATIVAN) 0.5 MG tablet Take 1 tablet (0.5 mg total) by mouth 2 (two) times daily as needed for  anxiety. 05/04/19  Yes Sudini, Alveta Heimlich, MD  montelukast (SINGULAIR) 10 MG tablet Take 1 tablet by mouth at bedtime. 01/15/15  Yes [provider]  Multiple Vitamin (MULTI-VITAMINS) TABS Take 1 tablet by mouth daily.   Yes [provider]  nebivolol (BYSTOLIC) 2.5 MG tablet Take 1 tablet by mouth 3 (three) times daily. 06/26/15  Yes [provider]  omeprazole (PRILOSEC) 20 MG capsule Take 1 capsule by mouth daily. 03/19/15  Yes [provider]  prednisoLONE acetate (PRED FORTE) 1 % ophthalmic suspension Place 1 drop into the right eye daily. 12/31/18  Yes  [provider]  albuterol (PROAIR HFA) 108 (90 BASE) MCG/ACT inhaler Inhale 2 puffs into the lungs every 6 (six) hours as needed. 01/25/15 05/03/19  [provider]    Allergies Prednisone, Acyclovir, Albuterol sulfate, Amlodipine, Brinzolamide, Budesonide-formoterol fumarate, Buspirone, Cefdinir, Celecoxib, Chlorthalidone, Clopidogrel, Codeine, Diazepam, Dicyclomine, Duloxetine, Famciclovir, Felodipine, Furosemide, Gabapentin, Hydrochlorothiazide, Indomethacin, Lisinopril, Losartan, Meclizine, Meloxicam, Metaxalone, Metoprolol tartrate, Morphine, Nabumetone, Naproxen, Olmesartan, Oxycodone, Penicillin v potassium, Prasugrel, Promethazine, Sulfa antibiotics, Torsemide, Tramadol, and Zolpidem  History reviewed. No pertinent family history.  Social History Social History   Tobacco Use  . Smoking status: Former Smoker    Quit date: 11/03/1984    Years since quitting: 34.9  . Smokeless tobacco: Never Used  Substance Use Topics  . Alcohol use: Yes    Alcohol/week: 14.0 standard drinks    Types: 14 Cans of beer per week  . Drug use: Not Currently    Review of Systems  Constitutional: Positive for fever Eyes: No visual changes. ENT: No sore throat. Cardiovascular: Denies chest pain. Respiratory: Positive for cough and shortness of breath. Gastrointestinal: No abdominal pain.  No nausea, no vomiting.  No diarrhea.  No constipation. Genitourinary: Negative for dysuria. Musculoskeletal: Negative for back pain. Skin: Negative for rash. Neurological: Negative for headaches, focal weakness or numbness.   ____________________________________________   PHYSICAL EXAM:  VITAL SIGNS: ED Triage Vitals  Enc Vitals Group     BP      Pulse      Resp      Temp      Temp src      SpO2      Weight      Height      Head Circumference      Peak Flow      Pain Score      Pain Loc      Pain Edu?      Excl. in [place]?     Constitutional: Alert and oriented. Elderly  appearing and in mild acute distress. Eyes: Conjunctivae are normal. PERRL. EOMI. Head: Atraumatic. Nose: No congestion/rhinnorhea. Mouth/Throat: Mucous membranes are moist.  Oropharynx non-erythematous. Neck: No stridor.   Cardiovascular: Normal rate, regular rhythm. Grossly normal heart sounds.  Good peripheral circulation. Respiratory: increased respiratory effort.  No retractions. Lungs with scattered rhonchi. Active loose cough. Gastrointestinal: Soft and nontender. No distention. No abdominal bruits. No CVA tenderness. Musculoskeletal: No lower extremity tenderness nor edema.  No joint effusions. Neurologic:  Normal speech and language. No gross focal neurologic deficits are appreciated.  Skin:  Skin is warm, dry and intact. Erythematous rash noted to right anterior trunk without vesicles suspicious for shingles.  Psychiatric: Mood and affect are normal. Speech and behavior are normal.  ____________________________________________   LABS (all labs ordered are listed, but only abnormal results are displayed)  Labs Reviewed  CBC WITH DIFFERENTIAL/PLATELET - Abnormal; Notable for the following components:  Result Value   WBC 15.4 (*)    RBC 3.97 (*)    HCT 37.9 (*)    Neutro Abs 14.0 (*)    Lymphs Abs 0.4 (*)    Abs Immature Granulocytes 0.09 (*)    All other components within normal limits  COMPREHENSIVE METABOLIC PANEL - Abnormal; Notable for the following components:   Glucose, Bld 125 (*)    All other components within normal limits  BRAIN NATRIURETIC PEPTIDE - Abnormal; Notable for the following components:   B Natriuretic Peptide 276.0 (*)    All other components within normal limits  URINALYSIS, COMPLETE (UACMP) WITH MICROSCOPIC - Abnormal; Notable for the following components:   Color, Urine YELLOW (*)    APPearance CLEAR (*)    Leukocytes,Ua TRACE (*)    All other components within normal limits  CULTURE, BLOOD (ROUTINE X 2)  CULTURE, BLOOD (ROUTINE X 2)   URINE CULTURE  SARS CORONAVIRUS 2 (TAT 6-24 HRS)  LACTIC ACID, PLASMA  LACTIC ACID, PLASMA  HIV ANTIBODY (ROUTINE TESTING W REFLEX)  BASIC METABOLIC PANEL  CBC  POC SARS CORONAVIRUS 2 AG -  ED  POC SARS CORONAVIRUS 2 AG  POC SARS CORONAVIRUS 2 AG  TROPONIN I (HIGH SENSITIVITY)   ____________________________________________  EKG  ED ECG REPORT I, , J, the attending physician, personally viewed and interpreted this ECG.   Date: 09/29/2019  EKG Time: 2344  Rate: 94  Rhythm: normal EKG, normal sinus rhythm  Axis: LAD  Intervals:left bundle branch block  ST&T Change: Nonspecific No change from June 2020 ____________________________________________  RADIOLOGY  ED MD interpretation: Right-sided pneumonia  Official radiology report(s): Dg Chest Port 1 View  Result Date: 09/30/2019 CLINICAL DATA:  Shortness of breath EXAM: PORTABLE CHEST 1 VIEW COMPARISON:  Radiograph 10/18/2015 FINDINGS: Developing consolidation in the right lung base and right upper lung. Some streaky opacity in the retrocardiac space as well. Mild airways thickening. No pneumothorax or visible effusion. Cardiomegaly similar to prior with a calcified, tortuous aorta. Pacer pack overlies the left chest wall with leads at the cardiac apex and right atrium. No acute osseous or soft tissue abnormality. Degenerative changes are present in the imaged spine and shoulders. IMPRESSION: 1. Developing consolidation in the right lung base and right upper lung, suspicious for pneumonia. 2. Streaky opacity in the retrocardiac space could reflect additional developing consolidation or atelectasis. 3. Cardiomegaly without edema. Electronically Signed   By: Lovena Le M.D.   On: 09/30/2019 00:16    ____________________________________________   PROCEDURES  Procedure(s) performed (including Critical Care):  Procedures   ____________________________________________   INITIAL IMPRESSION / ASSESSMENT AND PLAN / ED  COURSE  As part of my medical decision making, I reviewed the following data within the [place] notes reviewed and incorporated, Labs reviewed, EKG interpreted, Old chart reviewed, Radiograph reviewed and Notes from prior ED visits     Darrell Jacobson was evaluated in Emergency Department on 09/30/2019 for the symptoms described in the history of present illness. He was evaluated in the context of the global COVID-19 pandemic, which necessitated consideration that the patient might be at risk for infection with the SARS-CoV-2 virus that causes COVID-19. Institutional protocols and algorithms that pertain to the evaluation of patients at risk for COVID-19 are in a state of rapid change based on information released by regulatory bodies including the CDC and federal and state organizations. These policies and algorithms were followed during the patient's care in the ED.  85 year old male who presents for fever, cough, sob x 4 days. Differential includes, but is not limited to, viral syndrome, bronchitis including COPD exacerbation, pneumonia, reactive airway disease including asthma, CHF including exacerbation with or without pulmonary/interstitial edema, pneumothorax, ACS, thoracic trauma, and pulmonary embolism.  Will obtain sepsis work-up, rectal temperature, cxr, rapid COVID antigen screen. EMS reports RA saturations 93%, 99% on 6L [place] oxygen. Will reassess.   Clinical Course as of Sep 29 301  Fri Sep 30, 2019  0017 Covid antigen test is negative.   [JS]  0127 Attempted to get patient up to ambulate with pulse ox.  Patient was too weak to even sit alongside of the bed.  Will discuss with hospitalist services to evaluate patient in the emergency department for admission.   [JS]    Clinical Course User Index [JS] Paulette Blanch, MD     ____________________________________________   FINAL CLINICAL IMPRESSION(S) / ED DIAGNOSES  Final diagnoses:  Shortness of  breath  Fever, unspecified fever cause  Community acquired pneumonia, unspecified laterality     ED Discharge Orders    None       Note:  This document was prepared using Dragon voice recognition software and may include unintentional dictation errors.   Paulette Blanch, MD 09/30/19 [phone]

## 2019-09-30 ENCOUNTER — Encounter: Payer: Self-pay | Admitting: Family Medicine

## 2019-09-30 ENCOUNTER — Other Ambulatory Visit: Payer: Self-pay

## 2019-09-30 ENCOUNTER — Emergency Department: Payer: Medicare HMO

## 2019-09-30 DIAGNOSIS — I442 Atrioventricular block, complete: Secondary | ICD-10-CM | POA: Diagnosis not present

## 2019-09-30 DIAGNOSIS — N179 Acute kidney failure, unspecified: Secondary | ICD-10-CM | POA: Diagnosis not present

## 2019-09-30 DIAGNOSIS — A419 Sepsis, unspecified organism: Secondary | ICD-10-CM | POA: Diagnosis not present

## 2019-09-30 DIAGNOSIS — Z7982 Long term (current) use of aspirin: Secondary | ICD-10-CM | POA: Diagnosis not present

## 2019-09-30 DIAGNOSIS — J440 Chronic obstructive pulmonary disease with acute lower respiratory infection: Secondary | ICD-10-CM | POA: Diagnosis not present

## 2019-09-30 DIAGNOSIS — E785 Hyperlipidemia, unspecified: Secondary | ICD-10-CM

## 2019-09-30 DIAGNOSIS — R079 Chest pain, unspecified: Secondary | ICD-10-CM | POA: Diagnosis not present

## 2019-09-30 DIAGNOSIS — Z888 Allergy status to other drugs, medicaments and biological substances status: Secondary | ICD-10-CM | POA: Diagnosis not present

## 2019-09-30 DIAGNOSIS — I5022 Chronic systolic (congestive) heart failure: Secondary | ICD-10-CM

## 2019-09-30 DIAGNOSIS — H409 Unspecified glaucoma: Secondary | ICD-10-CM | POA: Diagnosis present

## 2019-09-30 DIAGNOSIS — J189 Pneumonia, unspecified organism: Secondary | ICD-10-CM | POA: Diagnosis not present

## 2019-09-30 DIAGNOSIS — Z8679 Personal history of other diseases of the circulatory system: Secondary | ICD-10-CM | POA: Diagnosis not present

## 2019-09-30 DIAGNOSIS — R0602 Shortness of breath: Secondary | ICD-10-CM | POA: Diagnosis not present

## 2019-09-30 DIAGNOSIS — I2510 Atherosclerotic heart disease of native coronary artery without angina pectoris: Secondary | ICD-10-CM | POA: Diagnosis present

## 2019-09-30 DIAGNOSIS — J181 Lobar pneumonia, unspecified organism: Secondary | ICD-10-CM | POA: Diagnosis not present

## 2019-09-30 DIAGNOSIS — I739 Peripheral vascular disease, unspecified: Secondary | ICD-10-CM

## 2019-09-30 DIAGNOSIS — J449 Chronic obstructive pulmonary disease, unspecified: Secondary | ICD-10-CM

## 2019-09-30 DIAGNOSIS — K219 Gastro-esophageal reflux disease without esophagitis: Secondary | ICD-10-CM | POA: Diagnosis present

## 2019-09-30 DIAGNOSIS — Z8673 Personal history of transient ischemic attack (TIA), and cerebral infarction without residual deficits: Secondary | ICD-10-CM | POA: Diagnosis not present

## 2019-09-30 DIAGNOSIS — Z96651 Presence of right artificial knee joint: Secondary | ICD-10-CM | POA: Diagnosis present

## 2019-09-30 DIAGNOSIS — R6520 Severe sepsis without septic shock: Secondary | ICD-10-CM | POA: Diagnosis not present

## 2019-09-30 DIAGNOSIS — Z833 Family history of diabetes mellitus: Secondary | ICD-10-CM | POA: Diagnosis not present

## 2019-09-30 DIAGNOSIS — I10 Essential (primary) hypertension: Secondary | ICD-10-CM

## 2019-09-30 DIAGNOSIS — Z950 Presence of cardiac pacemaker: Secondary | ICD-10-CM | POA: Diagnosis not present

## 2019-09-30 DIAGNOSIS — Z20828 Contact with and (suspected) exposure to other viral communicable diseases: Secondary | ICD-10-CM | POA: Diagnosis not present

## 2019-09-30 DIAGNOSIS — I110 Hypertensive heart disease with heart failure: Secondary | ICD-10-CM | POA: Diagnosis present

## 2019-09-30 LAB — BASIC METABOLIC PANEL
Anion gap: 10 (ref 5–15)
BUN: 13 mg/dL (ref 8–23)
CO2: 25 mmol/L (ref 22–32)
Calcium: 8.8 mg/dL — ABNORMAL LOW (ref 8.9–10.3)
Chloride: 102 mmol/L (ref 98–111)
Creatinine, Ser: 0.84 mg/dL (ref 0.61–1.24)
GFR calc Af Amer: >60 mL/min (ref >60)
GFR calc non Af Amer: >60 mL/min (ref >60)
Glucose, Bld: 112 mg/dL — ABNORMAL HIGH (ref 70–99)
Potassium: 4.1 mmol/L (ref 3.5–5.1)
Sodium: 137 mmol/L (ref 135–145)

## 2019-09-30 LAB — CBC WITH DIFFERENTIAL/PLATELET
Abs Immature Granulocytes: 0.09 10*3/uL — ABNORMAL HIGH (ref 0.00–0.07)
Basophils Absolute: 0.1 10*3/uL (ref 0.0–0.1)
Basophils Relative: 0 %
Eosinophils Absolute: 0.2 10*3/uL (ref 0.0–0.5)
Eosinophils Relative: 2 %
HCT: 37.9 % — ABNORMAL LOW (ref 39.0–52.0)
Hemoglobin: 13.2 g/dL (ref 13.0–17.0)
Immature Granulocytes: 1 %
Lymphocytes Relative: 2 %
Lymphs Abs: 0.4 10*3/uL — ABNORMAL LOW (ref 0.7–4.0)
MCH: 33.2 pg (ref 26.0–34.0)
MCHC: 34.8 g/dL (ref 30.0–36.0)
MCV: 95.5 fL (ref 80.0–100.0)
Monocytes Absolute: 0.6 10*3/uL (ref 0.1–1.0)
Monocytes Relative: 4 %
Neutro Abs: 14 10*3/uL — ABNORMAL HIGH (ref 1.7–7.7)
Neutrophils Relative %: 91 %
Platelets: 182 10*3/uL (ref 150–400)
RBC: 3.97 MIL/uL — ABNORMAL LOW (ref 4.22–5.81)
RDW: 13.2 % (ref 11.5–15.5)
WBC: 15.4 10*3/uL — ABNORMAL HIGH (ref 4.0–10.5)
nRBC: 0 % (ref 0.0–0.2)

## 2019-09-30 LAB — URINALYSIS, COMPLETE (UACMP) WITH MICROSCOPIC
Bacteria, UA: NONE SEEN
Bilirubin Urine: NEGATIVE
Glucose, UA: NEGATIVE mg/dL
Hgb urine dipstick: NEGATIVE
Ketones, ur: NEGATIVE mg/dL
Nitrite: NEGATIVE
Protein, ur: NEGATIVE mg/dL
Specific Gravity, Urine: 1.013 (ref 1.005–1.030)
Squamous Epithelial / HPF: NONE SEEN (ref 0–5)
pH: 6 (ref 5.0–8.0)

## 2019-09-30 LAB — COMPREHENSIVE METABOLIC PANEL
ALT: 15 U/L (ref 0–44)
AST: 22 U/L (ref 15–41)
Albumin: 4.1 g/dL (ref 3.5–5.0)
Alkaline Phosphatase: 66 U/L (ref 38–126)
Anion gap: 10 (ref 5–15)
BUN: 13 mg/dL (ref 8–23)
CO2: 24 mmol/L (ref 22–32)
Calcium: 9.1 mg/dL (ref 8.9–10.3)
Chloride: 102 mmol/L (ref 98–111)
Creatinine, Ser: 0.8 mg/dL (ref 0.61–1.24)
GFR calc Af Amer: >60 mL/min (ref >60)
GFR calc non Af Amer: >60 mL/min (ref >60)
Glucose, Bld: 125 mg/dL — ABNORMAL HIGH (ref 70–99)
Potassium: 3.9 mmol/L (ref 3.5–5.1)
Sodium: 136 mmol/L (ref 135–145)
Total Bilirubin: 0.8 mg/dL (ref 0.3–1.2)
Total Protein: 7.2 g/dL (ref 6.5–8.1)

## 2019-09-30 LAB — CBC
HCT: 35 % — ABNORMAL LOW (ref 39.0–52.0)
Hemoglobin: 12.5 g/dL — ABNORMAL LOW (ref 13.0–17.0)
MCH: 33.5 pg (ref 26.0–34.0)
MCHC: 35.7 g/dL (ref 30.0–36.0)
MCV: 93.8 fL (ref 80.0–100.0)
Platelets: 152 10*3/uL (ref 150–400)
RBC: 3.73 MIL/uL — ABNORMAL LOW (ref 4.22–5.81)
RDW: 13.4 % (ref 11.5–15.5)
WBC: 18.9 10*3/uL — ABNORMAL HIGH (ref 4.0–10.5)
nRBC: 0 % (ref 0.0–0.2)

## 2019-09-30 LAB — LACTIC ACID, PLASMA
Lactic Acid, Venous: 1.6 mmol/L (ref 0.5–1.9)
Lactic Acid, Venous: 1.9 mmol/L (ref 0.5–1.9)

## 2019-09-30 LAB — TROPONIN I (HIGH SENSITIVITY): Troponin I (High Sensitivity): 16 ng/L (ref <18)

## 2019-09-30 LAB — POC SARS CORONAVIRUS 2 AG
SARS Coronavirus 2 Ag: NEGATIVE
SARS Coronavirus 2 Ag: NEGATIVE

## 2019-09-30 LAB — BRAIN NATRIURETIC PEPTIDE: B Natriuretic Peptide: 276 pg/mL — ABNORMAL HIGH (ref 0.0–100.0)

## 2019-09-30 LAB — HIV ANTIBODY (ROUTINE TESTING W REFLEX): HIV Screen 4th Generation wRfx: NONREACTIVE

## 2019-09-30 LAB — SARS CORONAVIRUS 2 (TAT 6-24 HRS): SARS Coronavirus 2: NEGATIVE

## 2019-09-30 MED ORDER — IPRATROPIUM-ALBUTEROL 0.5-2.5 (3) MG/3ML IN SOLN
3.0000 mL | Freq: Four times a day (QID) | RESPIRATORY_TRACT | Status: DC
  Administered 2019-09-30 – 2019-10-02 (×8): 3 mL via RESPIRATORY_TRACT
  Filled 2019-09-30 (×9): qty 3

## 2019-09-30 MED ORDER — ENOXAPARIN SODIUM 40 MG/0.4ML ~~LOC~~ SOLN
40.0000 mg | SUBCUTANEOUS | Status: DC
  Administered 2019-09-30 – 2019-10-02 (×3): 40 mg via SUBCUTANEOUS
  Filled 2019-09-30 (×3): qty 0.4

## 2019-09-30 MED ORDER — ATORVASTATIN CALCIUM 20 MG PO TABS
40.0000 mg | ORAL_TABLET | Freq: Every day | ORAL | Status: DC
  Administered 2019-09-30: 40 mg via ORAL
  Filled 2019-09-30: qty 2

## 2019-09-30 MED ORDER — ONDANSETRON HCL 4 MG/2ML IJ SOLN: 4.0000 mg | Freq: Four times a day (QID) | INTRAMUSCULAR | Status: DC | PRN

## 2019-09-30 MED ORDER — LEVOFLOXACIN IN D5W 750 MG/150ML IV SOLN
750.0000 mg | Freq: Once | INTRAVENOUS | Status: AC
  Administered 2019-09-30: 750 mg via INTRAVENOUS
  Filled 2019-09-30: qty 150

## 2019-09-30 MED ORDER — ACETAMINOPHEN 325 MG PO TABS
650.0000 mg | ORAL_TABLET | Freq: Four times a day (QID) | ORAL | Status: DC | PRN
  Administered 2019-09-30: 650 mg via ORAL
  Filled 2019-09-30: qty 2

## 2019-09-30 MED ORDER — MONTELUKAST SODIUM 10 MG PO TABS
10.0000 mg | ORAL_TABLET | Freq: Every day | ORAL | Status: DC
  Administered 2019-09-30 – 2019-10-01 (×2): 10 mg via ORAL
  Filled 2019-09-30 (×2): qty 1

## 2019-09-30 MED ORDER — ASPIRIN EC 81 MG PO TBEC
81.0000 mg | DELAYED_RELEASE_TABLET | Freq: Every day | ORAL | Status: DC
  Administered 2019-09-30 – 2019-10-02 (×3): 81 mg via ORAL
  Filled 2019-09-30 (×3): qty 1

## 2019-09-30 MED ORDER — ONDANSETRON HCL 4 MG PO TABS: 4.0000 mg | ORAL_TABLET | Freq: Four times a day (QID) | ORAL | Status: DC | PRN

## 2019-09-30 MED ORDER — KETOROLAC TROMETHAMINE 15 MG/ML IJ SOLN
15.0000 mg | Freq: Four times a day (QID) | INTRAMUSCULAR | Status: DC | PRN
  Administered 2019-09-30: 15 mg via INTRAVENOUS
  Filled 2019-09-30: qty 1

## 2019-09-30 MED ORDER — PANTOPRAZOLE SODIUM 20 MG PO TBEC
20.0000 mg | DELAYED_RELEASE_TABLET | Freq: Every day | ORAL | Status: DC
  Administered 2019-09-30 – 2019-10-02 (×3): 20 mg via ORAL
  Filled 2019-09-30 (×4): qty 1

## 2019-09-30 MED ORDER — ENSURE ENLIVE PO LIQD
237.0000 mL | Freq: Two times a day (BID) | ORAL | Status: DC
  Administered 2019-09-30 – 2019-10-02 (×5): 237 mL via ORAL

## 2019-09-30 MED ORDER — NEBIVOLOL HCL 2.5 MG PO TABS
2.5000 mg | ORAL_TABLET | Freq: Three times a day (TID) | ORAL | Status: DC
  Administered 2019-09-30 – 2019-10-02 (×6): 2.5 mg via ORAL
  Filled 2019-09-30 (×9): qty 1

## 2019-09-30 MED ORDER — GUAIFENESIN-DM 100-10 MG/5ML PO SYRP
5.0000 mL | ORAL_SOLUTION | ORAL | Status: DC | PRN
  Administered 2019-10-01: 5 mL via ORAL
  Filled 2019-09-30: qty 5

## 2019-09-30 MED ORDER — LORAZEPAM 0.5 MG PO TABS
0.5000 mg | ORAL_TABLET | Freq: Two times a day (BID) | ORAL | Status: DC | PRN
  Administered 2019-09-30 – 2019-10-01 (×3): 0.5 mg via ORAL
  Filled 2019-09-30 (×4): qty 1

## 2019-09-30 MED ORDER — PREDNISOLONE ACETATE 1 % OP SUSP
1.0000 [drp] | Freq: Every day | OPHTHALMIC | Status: DC
  Administered 2019-09-30 – 2019-10-01 (×2): 1 [drp] via OPHTHALMIC
  Filled 2019-09-30: qty 1

## 2019-09-30 MED ORDER — LEVOFLOXACIN IN D5W 750 MG/150ML IV SOLN
750.0000 mg | INTRAVENOUS | Status: DC
  Administered 2019-09-30 – 2019-10-01 (×2): 750 mg via INTRAVENOUS
  Filled 2019-09-30 (×3): qty 150

## 2019-09-30 MED ORDER — LATANOPROST 0.005 % OP SOLN
1.0000 [drp] | Freq: Every day | OPHTHALMIC | Status: DC
  Administered 2019-09-30 – 2019-10-01 (×2): 1 [drp] via OPHTHALMIC
  Filled 2019-09-30: qty 2.5

## 2019-09-30 MED ORDER — ACETAMINOPHEN 650 MG RE SUPP: 650.0000 mg | Freq: Four times a day (QID) | RECTAL | Status: DC | PRN

## 2019-09-30 MED ORDER — FLUTICASONE PROPIONATE 50 MCG/ACT NA SUSP
1.0000 | Freq: Every day | NASAL | Status: DC
  Administered 2019-09-30 – 2019-10-02 (×3): 1 via NASAL
  Filled 2019-09-30: qty 16

## 2019-09-30 MED ORDER — DOXAZOSIN MESYLATE 4 MG PO TABS
8.0000 mg | ORAL_TABLET | Freq: Every day | ORAL | Status: DC
  Administered 2019-09-30 – 2019-10-01 (×2): 8 mg via ORAL
  Filled 2019-09-30: qty 2
  Filled 2019-09-30: qty 1
  Filled 2019-09-30 (×2): qty 2

## 2019-09-30 NOTE — Consult Note (Addendum)
Pharmacy Antibiotic Note  Darrell Jacobson is a 85 y.o. male admitted on 09/29/2019 with pneumonia.  Pharmacy has been consulted for Levaquin dosing.  Pt has multiple allergies with unspecified reactions including PCN and Cefdinir.  No apparent record of PCN or Cephalosporin use in med history or Care Everywhere.    Will investigate further.  Plan: Estimated CrCl > 50 ml/min  Will dose full dose Levaquin 750mg  q24h  Height: 5\' 6"  (167.6 cm) Weight: 166 lb (75.3 kg) IBW/kg (Calculated) : 63.8  Temp (24hrs), Avg:99.2 F (37.3 C), Min:98.2 F (36.8 C), Max:101.3 F (38.5 C)  Recent Labs  Lab 09/29/19 2349 09/30/19 0150 09/30/19 0553  WBC 15.4*  --  18.9*  CREATININE 0.80  --  0.84  LATICACIDVEN 1.9 1.6  --     Estimated Creatinine Clearance: 54.9 mL/min (by C-G formula based on SCr of 0.84 mg/dL).    Allergies  Allergen Reactions  . Prednisone Anxiety and Other (See Comments)  . Acyclovir Other (See Comments)  . Albuterol Sulfate Other (See Comments)  . Amlodipine Other (See Comments)  . Brinzolamide Other (See Comments)  . Budesonide-Formoterol Fumarate Other (See Comments)  . Buspirone Other (See Comments)  . Cefdinir Other (See Comments)  . Celecoxib Other (See Comments)  . Chlorthalidone Other (See Comments)  . Clopidogrel Nausea Only  . Codeine Nausea And Vomiting  . Diazepam Other (See Comments)  . Dicyclomine Other (See Comments)  . Duloxetine Other (See Comments)  . Famciclovir Other (See Comments)  . Felodipine Other (See Comments)  . Furosemide Other (See Comments)  . Gabapentin Swelling  . Hydrochlorothiazide Other (See Comments)  . Indomethacin Other (See Comments)  . Lisinopril Other (See Comments)  . Losartan Other (See Comments)  . Meclizine Other (See Comments)  . Meloxicam Other (See Comments)  . Metaxalone Other (See Comments)  . Metoprolol Tartrate Other (See Comments)  . Morphine Other (See Comments)  . Nabumetone Other (See Comments)  .  Naproxen Other (See Comments)  . Olmesartan Other (See Comments)  . Oxycodone     hallucinations  . Penicillin V Potassium Other (See Comments)  . Prasugrel Other (See Comments)    weakness  . Promethazine Other (See Comments)  . Sulfa Antibiotics Other (See Comments)  . Torsemide Other (See Comments)  . Tramadol Other (See Comments)  . Zolpidem Other (See Comments)    Antimicrobials this admission: Levaquin 11/26 >>  Dose adjustments this admission: None  Microbiology results: UCx/BCx pending  Thank you for allowing pharmacy to be a part of this patient's care.  Lu Duffel, PharmD, BCPS Clinical Pharmacist 09/30/2019 8:04 AM

## 2019-09-30 NOTE — Progress Notes (Signed)
Patient ID: Darrell Jacobson, male   DOB: Jul 23, 1928, 85 y.o.   MRN: [id number] [facility] PROGRESS NOTE  Darrell Jacobson [id number] DOB: 1928-09-29 DOA: 09/29/2019 PCP: Baxter Hire, MD  HPI/Subjective: Called to see the patient because of pains in the right side of his chest and ribs. Patient was admitted overnight with pneumonia. He does not wear oxygen at home. Having some shortness of breath and cough. The patient does have 39 allergies listed in the computer. Patient was asking something for severe pain in his ribs.  Objective: Vitals:   09/30/19 0755 09/30/19 0912  BP: (!) 137/55   Pulse: 72   Resp: 19   Temp: 98.3 F (36.8 C)   SpO2: 98% 98%    Intake/Output Summary (Last 24 hours) at 09/30/2019 1106 Last data filed at 09/30/2019 1017 Gross per 24 hour  Intake 240 ml  Output 1025 ml  Net -785 ml   Filed Weights   09/29/19 2345  Weight: 75.3 kg    ROS: Review of Systems  Constitutional: Negative for chills and fever.  Eyes: Negative for blurred vision.  Respiratory: Positive for cough and shortness of breath.   Cardiovascular: Positive for chest pain.  Gastrointestinal: Positive for abdominal pain. Negative for constipation, diarrhea, nausea and vomiting.  Genitourinary: Negative for dysuria.  Musculoskeletal: Positive for joint pain.  Neurological: Negative for dizziness and headaches.   Exam: Physical Exam  Constitutional: He is oriented to person, place, and time.  HENT:  Nose: No mucosal edema.  Mouth/Throat: No oropharyngeal exudate or posterior oropharyngeal edema.  Eyes: Pupils are equal, round, and reactive to light. Conjunctivae, EOM and lids are normal.  Neck: No JVD present. Carotid bruit is not present. No edema present. No thyroid mass and no thyromegaly present.  Cardiovascular: S1 normal and S2 normal. Exam reveals no gallop.  No murmur heard. Pulses:      Dorsalis pedis pulses are 2+ on the right side and 2+ on the left side.   Respiratory: No respiratory distress. He has no wheezes. He has no rhonchi. He has no rales.  GI: Soft. Bowel sounds are normal. There is no abdominal tenderness.  Musculoskeletal:     Right shoulder: He exhibits no swelling.  Lymphadenopathy:    He has no cervical adenopathy.  Neurological: He is alert and oriented to person, place, and time. No cranial nerve deficit.  Skin: Skin is warm. No rash noted. Nails show no clubbing.  Psychiatric: He has a normal mood and affect.      Data Reviewed: Basic Metabolic Panel: Recent Labs  Lab 09/29/19 2349 09/30/19 0553  NA 136 137  K 3.9 4.1  CL 102 102  CO2 24 25  GLUCOSE 125* 112*  BUN 13 13  CREATININE 0.80 0.84  CALCIUM 9.1 8.8*   Liver Function Tests: Recent Labs  Lab 09/29/19 2349  AST 22  ALT 15  ALKPHOS 66  BILITOT 0.8  PROT 7.2  ALBUMIN 4.1   CBC: Recent Labs  Lab 09/29/19 2349 09/30/19 0553  WBC 15.4* 18.9*  NEUTROABS 14.0*  --   HGB 13.2 12.5*  HCT 37.9* 35.0*  MCV 95.5 93.8  PLT 182 152   BNP (last 3 results) Recent Labs    09/29/19 2349  BNP 276.0*      Recent Results (from the past 240 hour(s))  Culture, blood (routine x 2)     Status: None (Preliminary result)   Collection Time: 09/29/19 11:49 PM   Specimen: BLOOD  Result Value Ref Range Status   Specimen Description BLOOD RIGHT ARM  Final   Special Requests   Final    BOTTLES DRAWN AEROBIC AND ANAEROBIC Blood Culture results may not be optimal due to an excessive volume of blood received in culture bottles   Culture   Final    NO GROWTH < 12 HOURS Performed at [facility], [address], [place], [place] [address]    Report Status PENDING  Incomplete  Culture, blood (routine x 2)     Status: None (Preliminary result)   Collection Time: 09/29/19 11:49 PM   Specimen: BLOOD  Result Value Ref Range Status   Specimen Description BLOOD RIGHT ASSIST CONTROL  Final   Special Requests   Final    BOTTLES DRAWN AEROBIC AND  ANAEROBIC Blood Culture results may not be optimal due to an excessive volume of blood received in culture bottles   Culture   Final    NO GROWTH < 12 HOURS Performed at [facility], [address] [place], [place] [address]    Report Status PENDING  Incomplete  SARS CORONAVIRUS 2 (TAT 6-24 HRS) Nasopharyngeal Nasopharyngeal Swab     Status: None   Collection Time: 09/30/19 12:23 AM   Specimen: Nasopharyngeal Swab  Result Value Ref Range Status   SARS Coronavirus 2 NEGATIVE NEGATIVE Final    Comment: (NOTE) SARS-CoV-2 target nucleic acids are NOT DETECTED. The SARS-CoV-2 RNA is generally detectable in upper and lower respiratory specimens during the acute phase of infection. Negative results do not preclude SARS-CoV-2 infection, do not rule out co-infections with other pathogens, and should not be used as the sole basis for treatment or other patient management decisions. Negative results must be combined with clinical observations, patient history, and epidemiological information. The expected result is Negative. Fact Sheet for Patients: SugarRoll.be Fact Sheet for Healthcare Providers: https://www.woods-mathews.com/ This test is not yet approved or cleared by the [place] FDA and  has been authorized for detection and/or diagnosis of SARS-CoV-2 by FDA under an Emergency Use Authorization (EUA). This EUA will remain  in effect (meaning this test can be used) for the duration of the COVID-19 declaration under Section 56 4(b)(1) of the Act, 21 U.S.C. section 360bbb-3(b)(1), unless the authorization is terminated or revoked sooner. Performed at [facility], [place] [address], [place], [place] [address]      Studies: Dg Chest Port 1 View  Result Date: 09/30/2019 CLINICAL DATA:  Shortness of breath EXAM: PORTABLE CHEST 1 VIEW COMPARISON:  Radiograph 10/18/2015 FINDINGS: Developing consolidation in the right lung base  and right upper lung. Some streaky opacity in the retrocardiac space as well. Mild airways thickening. No pneumothorax or visible effusion. Cardiomegaly similar to prior with a calcified, tortuous aorta. Pacer pack overlies the left chest wall with leads at the cardiac apex and right atrium. No acute osseous or soft tissue abnormality. Degenerative changes are present in the imaged spine and shoulders. IMPRESSION: 1. Developing consolidation in the right lung base and right upper lung, suspicious for pneumonia. 2. Streaky opacity in the retrocardiac space could reflect additional developing consolidation or atelectasis. 3. Cardiomegaly without edema. Electronically Signed   By: Lovena Le M.D.   On: 09/30/2019 00:16    Scheduled Meds: . aspirin EC  81 mg Oral Daily  . atorvastatin  40 mg Oral q1800  . enoxaparin (LOVENOX) injection  40 mg Subcutaneous Q24H  . fluticasone  1 spray Each Nare Daily  . ipratropium-albuterol  3 mL Nebulization Q6H  . latanoprost  1 drop Both Eyes QHS  . montelukast  10 mg Oral QHS  . nebivolol  2.5 mg Oral TID   Continuous Infusions: . levofloxacin (LEVAQUIN) IV      Assessment/Plan:  1. Clinical sepsis, present on admission with lobar pneumonia in right lower lobe and right upper lobe.  Patient had fever, leukocytosis.  Patient with multiple allergies.  Levaquin prescribed 2. Acute kidney injury with creatinine at 0.84.  Baseline creatinine 0.69.  With history of chronic systolic CHF I will hold off on fluids at this point. 3. COPD.  Start nebulizer treatments.  Patient allergic to steroids so I will hold off on other inhalers or systemic steroids.  On Singulair. 4. Pleuritic pain right chest.  Likely from pneumonia.  With multiple allergies I will try Toradol. 5. Essential hypertension on nebivolol 6. Hyperlipidemia on atorvastatin 7. Weakness.  Physical therapy evaluation   Code Status:     Code Status Orders  (From admission, onward)          Start     Ordered   09/30/19 0240  Full code  Continuous     09/30/19 0239        Code Status History    Date Active Date Inactive Code Status Order ID Comments User Context   05/03/2019 0428 05/04/2019 1959 Full Code [id number]  Harrie Foreman, MD Inpatient   Advance Care Planning Activity     Family Communication: left message for son Disposition Plan: To be determined  Antibiotics:  Levaquin  Time spent: 35 minutes  [place]

## 2019-09-30 NOTE — Evaluation (Signed)
Physical Therapy Evaluation Patient Details Name: Darrell Jacobson MRN: [id number] DOB: 27-Jul-1928 Today's Date: 09/30/2019   History of Present Illness  Per MD note: Pt is an 85 y.o. M with hx sCHF EF 30%, HTN, CAD no recent PCI, CHB s/p pacer, history of CVA, PVD s/p carotid stent, and asthma/COPD not on oxygen who presents with fever and cough 4 days.  MD assessment includes: Community-acquired pneumonia of the right upper and right lower lobes, chronic systolic CHF, HTN, cerebrovascular disease, heart block with pacer, and asthma/COPD.    Clinical Impression  Pt presented with deficits in strength, transfers, mobility, gait, balance, and activity tolerance but reports feeling close to his baseline physically.  Pt presented with min instability during transfers and amb and reports does not walk unless he has someone beside him for safety, otherwise when he's alone he uses his w/c in the home and performs transfers as needed.  Pt did require min A to prevent posterior LOB upon initial stand from the EOB but only needed close CGA during amb with the RW.  Pt will benefit from continued HHPT services that he was receiving prior to this admission upon discharge to safely address above deficits for decreased caregiver assistance and decreased risk of falls and further functional decline.      Follow Up Recommendations Home health PT;Other (comment);Supervision - Intermittent(Pt currently receiving HHPT)    Equipment Recommendations  None recommended by PT    Recommendations for Other Services       Precautions / Restrictions Precautions Precautions: Fall Restrictions Weight Bearing Restrictions: No      Mobility  Bed Mobility Overal bed mobility: Modified Independent             General bed mobility comments: Extra time and effort but no physical assistance needed  Transfers Overall transfer level: Needs assistance Equipment used: Rolling walker (2 wheeled) Transfers: Sit  to/from Stand Sit to Stand: Min assist         General transfer comment: Min A to prevent posterior LOB upon initial stand with min verbal and visual cues for increased trunk flexion upon standing  Ambulation/Gait Ambulation/Gait assistance: Min guard Gait Distance (Feet): 10 Feet Assistive device: Rolling walker (2 wheeled) Gait Pattern/deviations: Step-through pattern;Decreased step length - right;Decreased step length - left;Trunk flexed Gait velocity: decreased   General Gait Details: Mod verbal cues for amb closer to the [employer] Mobility    Modified Rankin (Stroke Patients Only)       Balance Overall balance assessment: Needs assistance Sitting-balance support: Feet supported;Single extremity supported Sitting balance-Leahy Scale: Good       Standing balance-Leahy Scale: Poor Standing balance comment: Posterior instability upon standing from sitting requiring min A to prevent LOB                             Pertinent Vitals/Pain Pain Assessment: No/denies pain    Home Living Family/patient expects to be discharged to:: Private residence Living Arrangements: Alone Available Help at Discharge: Family;Personal care attendant;Available PRN/intermittently Type of Home: House Home Access: Stairs to enter Entrance Stairs-Rails: Right Entrance Stairs-Number of Steps: 3 Home Layout: One level Home Equipment: Walker - 2 wheels;Wheelchair - manual;Grab bars - toilet Additional Comments: Has bed rails; son and dtr assist intermittently for transportation, errands, etc... Has a PCA 2 hrs/day, 7 days/wk    Prior Function Level of Independence:  Needs assistance   Gait / Transfers Assistance Needed: SBA with Amb HH distances with a RW, no falls in the last 6 months, uses w/c for [facility] mobility when alone and transfers w/c to toilet or bed Independently  ADL's / Homemaking Assistance Needed: PCA assists with cleaning, meal prep, and  occasional bathing/dressing assist if needed        Hand Dominance   Dominant Hand: Right    Extremity/Trunk Assessment   Upper Extremity Assessment Upper Extremity Assessment: Generalized weakness    Lower Extremity Assessment Lower Extremity Assessment: Generalized weakness;RLE deficits/detail;LLE deficits/detail RLE Deficits / Details: RLE strength grossly 4/5 LLE Deficits / Details: LLE strength grossly 3+ to 4-/5, chronically weaker than the RLE since CVA June 2020 per patient       Communication      Cognition Arousal/Alertness: Awake/alert Behavior During Therapy: WFL for tasks assessed/performed Overall Cognitive Status: Within Functional Limits for tasks assessed                                        General Comments      Exercises Total Joint Exercises Ankle Circles/Pumps: Strengthening;Both;5 reps;10 reps Quad Sets: Strengthening;Both;5 reps;10 reps Gluteal Sets: Strengthening;Both;10 reps Heel Slides: AROM;Both;5 reps Hip ABduction/ADduction: Strengthening;Both;10 reps Straight Leg Raises: Strengthening;Both;10 reps Long Arc Quad: Strengthening;Both;5 reps;10 reps Knee Flexion: Strengthening;Both;5 reps;10 reps Marching in Standing: AROM;Both;5 reps;Standing Other Exercises Other Exercises: HEP education for BLE APs, QS, and GS x 10 each every 1-2 hours daily   Assessment/Plan    PT Assessment Patient needs continued PT services  PT Problem List Decreased strength;Decreased activity tolerance;Decreased balance;Decreased mobility;Decreased knowledge of use of DME       PT Treatment Interventions DME instruction;Gait training;Stair training;Functional mobility training;Therapeutic activities;Therapeutic exercise;Balance training;Patient/family education    PT Goals (Current goals can be found in the Care Plan section)  Acute Rehab PT Goals Patient Stated Goal: To breathe better and return home PT Goal Formulation: With  patient Time For Goal Achievement: 10/13/19 Potential to Achieve Goals: Good    Frequency Min 2X/week   Barriers to discharge        Co-evaluation               AM-PAC PT "6 Clicks" Mobility  Outcome Measure Help needed turning from your back to your side while in a flat bed without using bedrails?: A Little Help needed moving from lying on your back to sitting on the side of a flat bed without using bedrails?: A Little Help needed moving to and from a bed to a chair (including a wheelchair)?: A Little Help needed standing up from a chair using your arms (e.g., wheelchair or bedside chair)?: A Little Help needed to walk in hospital room?: A Little Help needed climbing 3-5 steps with a railing? : A Lot 6 Click Score: 17    End of Session Equipment Utilized During Treatment: Gait belt;Oxygen Activity Tolerance: Patient tolerated treatment well Patient left: in chair;with call bell/phone within reach;with chair alarm set;with nursing/sitter in room Nurse Communication: Mobility status PT Visit Diagnosis: Unsteadiness on feet (R26.81);Difficulty in walking, not elsewhere classified (R26.2);Muscle weakness (generalized) (M62.81)    Time: [id number] PT Time Calculation (min) (ACUTE ONLY): 32 min   Charges:   PT Evaluation $PT Eval Moderate Complexity: 1 Mod PT Treatments $Therapeutic Exercise: 8-22 mins        D. Royetta Asal PT, DPT 09/30/19,  11:33 AM

## 2019-09-30 NOTE — ED Notes (Signed)
Urine sent to lab 

## 2019-09-30 NOTE — ED Notes (Signed)
Report given to Olivia, RN

## 2019-09-30 NOTE — H&P (Signed)
History and Physical  Patient Name: Darrell Jacobson     [id number]    DOB: [birth year]    DOA: 09/29/2019 PCP: Baxter Hire, MD  Patient coming from: Home  Chief Complaint: Fever and cough      HPI: Darrell Jacobson is a 85 y.o. M with hx sCHF EF 30%, HTN, CAD no recent PCI, CHB s/p pacer, history of CVA, PVD s/p carotid stent, and asthma/COPD not on oxygen who presents with fever and cough 4 days.   Patient was in his usual state of health until few days ago when he started to have cough, then fever.  Today his cough was much worse, more "wet", and he was short of breath so he came to the ER.  In the ER, temp 1 1.29F, pulse ox 92% on room air.  WBC 15K, BNP slightly elevated 276, lactate 1.9, urinalysis unremarkable, chest x-ray showed new right upper lobe and lower lobe opacity.  He was given Levaquin in the hospital service were asked to evaluate for community-acquired pneumonia.            ROS: Review of Systems  Constitutional: Positive for fever and malaise/fatigue.  Respiratory: Positive for cough and shortness of breath.   All other systems reviewed and are negative.         Past Medical History:  Diagnosis Date  . Arthritis   . Asthma    COPD with inhaler use  . Cancer [facility]) 2013   Basal Cell Skin CA resected from scalp.  . Carotid stenosis    pateint states he has Left carotid blockage worse than Right.   . CHF (congestive heart failure) ([place])   . Collagen vascular disease ([place])   . COPD (chronic obstructive pulmonary disease) ([place])   . Coronary artery disease   . Dyspnea   . GERD (gastroesophageal reflux disease)   . Glaucoma   . History of hiatal hernia   . History of shingles 2012   forehead  . Hypertension   . Multilevel degenerative disc disease   . Neuropathy    legs and feet  . Pacemaker 2006    Past Surgical History:  Procedure Laterality Date  . BACK SURGERY     2 lumbar, 1 thoracic  . CATARACT EXTRACTION W/PHACO Right  12/24/2016   Procedure: CATARACT EXTRACTION PHACO AND INTRAOCULAR LENS PLACEMENT ([facility])  Right  complicated;  Surgeon: Leandrew Koyanagi, MD;  Location: [facility];  Service: Ophthalmology;  Laterality: Right;  [place]  . JOINT REPLACEMENT Right    knee  . SKIN DEBRIDEMENT  06/13/2006   facial  . SKIN GRAFT Right 07/11/2016   facial    Social History: Patient lives alone, he has aides that come and sometimes.  The patient walks with a walker, although he has been more limited lately.  Nonsmoker.  Allergies  Allergen Reactions  . Prednisone Anxiety and Other (See Comments)  . Acyclovir Other (See Comments)  . Albuterol Sulfate Other (See Comments)  . Amlodipine Other (See Comments)  . Brinzolamide Other (See Comments)  . Budesonide-Formoterol Fumarate Other (See Comments)  . Buspirone Other (See Comments)  . Cefdinir Other (See Comments)  . Celecoxib Other (See Comments)  . Chlorthalidone Other (See Comments)  . Clopidogrel Nausea Only  . Codeine Nausea And Vomiting  . Diazepam Other (See Comments)  . Dicyclomine Other (See Comments)  . Duloxetine Other (See Comments)  . Famciclovir Other (See Comments)  . Felodipine Other (See Comments)  . Furosemide  Other (See Comments)  . Gabapentin Swelling  . Hydrochlorothiazide Other (See Comments)  . Indomethacin Other (See Comments)  . Lisinopril Other (See Comments)  . Losartan Other (See Comments)  . Meclizine Other (See Comments)  . Meloxicam Other (See Comments)  . Metaxalone Other (See Comments)  . Metoprolol Tartrate Other (See Comments)  . Morphine Other (See Comments)  . Nabumetone Other (See Comments)  . Naproxen Other (See Comments)  . Olmesartan Other (See Comments)  . Oxycodone     hallucinations  . Penicillin V Potassium Other (See Comments)  . Prasugrel Other (See Comments)    weakness  . Promethazine Other (See Comments)  . Sulfa Antibiotics Other (See Comments)  . Torsemide Other (See Comments)  .  Tramadol Other (See Comments)  . Zolpidem Other (See Comments)    Family history: Family history of heart problems, diabetes.  Prior to Admission medications   Medication Sig Start Date End Date Taking? Authorizing Provider  acetaminophen (TYLENOL) 500 MG tablet Take 1 tablet by mouth as needed.   Yes [provider]  aspirin EC 81 MG tablet Take 1 tablet by mouth daily.   Yes [provider]  atorvastatin (LIPITOR) 40 MG tablet Take 1 tablet (40 mg total) by mouth daily at 6 PM. 05/04/19  Yes Sudini, Srikar, MD  bimatoprost (LUMIGAN) 0.01 % SOLN Apply 1 Dose to eye at bedtime. 01/16/14  Yes [provider]  Cholecalciferol (D 2000) 2000 UNITS TABS Take 1 tablet by mouth daily.   Yes [provider]  doxazosin (CARDURA) 8 MG tablet Take 1 tablet by mouth at bedtime. 06/21/15  Yes [provider]  Ferrous Sulfate (SLOW FE PO) Take by mouth.   Yes [provider]  fluticasone (FLONASE) 50 MCG/ACT nasal spray Place 2 sprays into the nose daily. 12/20/18 12/20/19 Yes [provider]  LORazepam (ATIVAN) 0.5 MG tablet Take 1 tablet (0.5 mg total) by mouth 2 (two) times daily as needed for anxiety. 05/04/19  Yes Sudini, Alveta Heimlich, MD  montelukast (SINGULAIR) 10 MG tablet Take 1 tablet by mouth at bedtime. 01/15/15  Yes [provider]  Multiple Vitamin (MULTI-VITAMINS) TABS Take 1 tablet by mouth daily.   Yes [provider]  nebivolol (BYSTOLIC) 2.5 MG tablet Take 1 tablet by mouth 3 (three) times daily. 06/26/15  Yes [provider]  omeprazole (PRILOSEC) 20 MG capsule Take 1 capsule by mouth daily. 03/19/15  Yes [provider]  prednisoLONE acetate (PRED FORTE) 1 % ophthalmic suspension Place 1 drop into the right eye daily. 12/31/18  Yes [provider]  albuterol (PROAIR HFA) 108 (90 BASE) MCG/ACT inhaler Inhale 2 puffs into the lungs every 6 (six) hours as needed. 01/25/15 05/03/19  [provider]       Physical Exam: BP 128/62 (BP Location: Left Arm)   Pulse 73   Temp 98.2 F (36.8 C) (Oral)   Resp 20   Ht 5\' 6"  (1.676 m)   Wt 75.3 kg   SpO2 98%   BMI 26.79 kg/m  General appearance: Frail elderly adult male, alert and in no acute distress but appears very tired and weak.   Head/eyes: Large scar from old basal cell carcinoma on the forehead.  Anicteric, conjunctiva pink, lids and lashes normal for age. PERRL.    ENT: No nasal deformity, discharge, epistaxis.  Hearing diminished. OP moist without lesions.   Neck: No neck masses.  Trachea midline.  No thyromegaly/tenderness. Lymph: No cervical or supraclavicular lymphadenopathy.  Skin: Warm and dry.  No jaundice.  Forehead scar as above.  No suspicious rashes or lesions. Cardiac: RRR, nl S1-S2, no murmurs appreciated.  Capillary refill is brisk.  JVP not visible.  No LE edema.  Radial pulses 2+ and symmetric. Respiratory: Normal respiratory rate and rhythm.  Rales on the right, diminished on the right.  No wheezing.  Normal breath sounds in the left. Abdomen: Abdomen soft.  No TTP or guarding. No ascites, distension, hepatosplenomegaly.   MSK: No deformities or effusions of the large joints of the upper or lower extremities bilaterally.  No cyanosis or clubbing.  Diffuse loss of subcutaneous muscle mass and fat. Neuro: Cranial nerves grossly normal.  Sensation intact to light touch. Speech is fluent.  Muscle strength globally weak.    Psych: Sensorium intact and responding to questions, attention normal.  Behavior appropriate.  Affect blunted.  Judgment and insight appear normal.     Labs on Admission:  I have personally reviewed following labs and imaging studies: CBC: Recent Labs  Lab 09/29/19 2349  WBC 15.4*  NEUTROABS 14.0*  HGB 13.2  HCT 37.9*  MCV 95.5  PLT [id number]   Basic Metabolic Panel: Recent Labs  Lab 09/29/19 2349  NA 136  K 3.9  CL 102  CO2 24  GLUCOSE 125*  BUN 13  CREATININE 0.80  CALCIUM 9.1    GFR: Estimated Creatinine Clearance: 57.6 mL/min (by C-G formula based on SCr of 0.8 mg/dL).  Liver Function Tests: Recent Labs  Lab 09/29/19 2349  AST 22  ALT 15  ALKPHOS 66  BILITOT 0.8  PROT 7.2  ALBUMIN 4.1    Sepsis Labs: Lactic acid 1.9         Radiological Exams on Admission: Personally reviewed chest x-ray shows right upper lobe and right lower lobe opacities: Dg Chest Port 1 View  Result Date: 09/30/2019 CLINICAL DATA:  Shortness of breath EXAM: PORTABLE CHEST 1 VIEW COMPARISON:  Radiograph 10/18/2015 FINDINGS: Developing consolidation in the right lung base and right upper lung. Some streaky opacity in the retrocardiac space as well. Mild airways thickening. No pneumothorax or visible effusion. Cardiomegaly similar to prior with a calcified, tortuous aorta. Pacer pack overlies the left chest wall with leads at the cardiac apex and right atrium. No acute osseous or soft tissue abnormality. Degenerative changes are present in the imaged spine and shoulders. IMPRESSION: 1. Developing consolidation in the right lung base and right upper lung, suspicious for pneumonia. 2. Streaky opacity in the retrocardiac space could reflect additional developing consolidation or atelectasis. 3. Cardiomegaly without edema. Electronically Signed   By: Lovena Le M.D.   On: 09/30/2019 00:16    EKG: Independently reviewed.  Paced rhythm       Assessment/Plan   Community-acquired pneumonia of the right upper and right lower lobes PSI 108, class IV risk. -Continue Levaquin -Follow-up blood cultures -Flutter valve, turn/cough/I-S -Robitussin -PT eval   Chronic systolic CHF Not on diuretics.  Appears euvolemic.  Hypertension Cerebrovascular disease secondary prevention Blood pressure normal -Continue nebivolol -Continue aspirin, Lipitor -Hold doxazosin until hemodynamics are clear  Heart block with pacer  Asthma/COPD No wheezing -Hold Singulair -Continue PPI       DVT prophylaxis: Lovenox Code Status: Full code Family Communication:   Disposition Plan: Anticipate IV antibiotics, pulmonary toilet, likely several days admission. Consults called:  Admission status: Inpatient   At the time of admission, it appears that the appropriate admission status for this patient is INPATIENT. This is judged to  be reasonable and necessary in order to provide the required intensity of service to ensure the patient's safety given: -presenting symptoms of cough, fever, malaise, dyspnea -physical exam findings of rales, and  -initial radiographic and laboratory data pneumonia on chest x-ray, leukocytosis -in the context of their chronic comorbidities history of stroke, history of systolic heart failure, pacemaker in place    Together, these circumstances are felt to place him at high risk for further clinical deterioration threatening life, limb, or organ requiring a high intensity of service due to this acute illness that poses a threat to life, limb or bodily function.  I certify that at the point of admission it is my clinical judgment that the patient will require inpatient hospital care spanning beyond 2 midnights from the point of admission and that early discharge would result in unnecessary risk of decompensation and readmission or threat to life, limb or bodily function.   Medical decision making: Patient seen at 4:29 AM on 09/30/2019.  The patient was discussed with Dr. Nancy Fetter.  What exists of the patient's chart was reviewed in depth and summarized above.  Clinical condition: Stable on supplemental oxygen, mentating well.        [place] [facility] Please page though [place] or Epic secure chat:  For password, contact charge nurse

## 2019-09-30 NOTE — ED Notes (Signed)
Attempted to assist pt to side of the bed to stand to collect urine sample but pt states he is too weak to stand, Dr. Beather Arbour informed

## 2019-09-30 NOTE — Progress Notes (Signed)
Initial Nutrition Assessment  DOCUMENTATION CODES:   Not applicable  INTERVENTION:   Ensure Enlive po BID, each supplement provides 350 kcal and 20 grams of protein  NUTRITION DIAGNOSIS:   Inadequate oral intake related to decreased appetite as evidenced by per patient/family report.  GOAL:   Patient will meet greater than or equal to 90% of their needs   MONITOR:   PO intake, Supplement acceptance, Labs, Weight trends  REASON FOR ASSESSMENT:   Malnutrition Screening Tool    ASSESSMENT:   85 yo male admitted with acute respiratory failure with CAP. PMH includes CHF EF 30%, HTN, CAD, CVA, PVD, COPD, GERD.   RD working remotely.  Pt on Heart Healthy diet, ate 100% at breakfast this AM  Based on weight encounters, weight has been slowly trending down. Current wt 75.3 kg; wt of 78.5 kg in June of 2020 (4% wt loss); noted weight of 85 kg in 2018.   Labs: reviewed Meds: reviewed   Diet Order:   Diet Order            Diet Heart Room service appropriate? Yes; Fluid consistency: Thin  Diet effective now              EDUCATION NEEDS:   Not appropriate for education at this time  Skin:  Skin Assessment: Reviewed RN Assessment  Last BM:  no documented BM  Height:   Ht Readings from Last 1 Encounters:  09/29/19 5\' 6"  (1.676 m)    Weight:   Wt Readings from Last 1 Encounters:  09/29/19 75.3 kg     BMI:  Body mass index is 26.79 kg/m.  Estimated Nutritional Needs:   Kcal:  1750-1950 kcals  Protein:  88-98 g  Fluid:  >/- 1.7 L    Cate Taniya Dasher MS, RDN, LDN, CNSC [phone] Pager  [phone] Weekend/On-Call Pager

## 2019-10-01 DIAGNOSIS — R079 Chest pain, unspecified: Secondary | ICD-10-CM

## 2019-10-01 LAB — CBC
HCT: 34.6 % — ABNORMAL LOW (ref 39.0–52.0)
Hemoglobin: 11.8 g/dL — ABNORMAL LOW (ref 13.0–17.0)
MCH: 33.4 pg (ref 26.0–34.0)
MCHC: 34.1 g/dL (ref 30.0–36.0)
MCV: 98 fL (ref 80.0–100.0)
Platelets: 134 10*3/uL — ABNORMAL LOW (ref 150–400)
RBC: 3.53 MIL/uL — ABNORMAL LOW (ref 4.22–5.81)
RDW: 13.6 % (ref 11.5–15.5)
WBC: 12.2 10*3/uL — ABNORMAL HIGH (ref 4.0–10.5)
nRBC: 0 % (ref 0.0–0.2)

## 2019-10-01 MED ORDER — LORAZEPAM 0.5 MG PO TABS
0.5000 mg | ORAL_TABLET | Freq: Three times a day (TID) | ORAL | Status: DC
  Administered 2019-10-01 – 2019-10-02 (×3): 0.5 mg via ORAL
  Filled 2019-10-01 (×3): qty 1

## 2019-10-01 NOTE — Progress Notes (Signed)
Patient ID: Darrell Jacobson, male   DOB: [birth year], 85 y.o.   MRN: [id number] [facility] PROGRESS NOTE  [facility] [place] [id number] DOB: [birth year] DOA: 09/29/2019 PCP: Baxter Hire, MD  HPI/Subjective: Patient feeling better today than yesterday.  That right-sided chest pain that he was having yesterday is now burning in quality.  But much improved since yesterday.  He is able to take a deep breath and feeling better with regards to his breathing.  He was able to come off oxygen today.  Objective: Vitals:   10/01/19 0758 10/01/19 0806  BP:    Pulse: 75   Resp:    Temp:    SpO2: 96% 98%    Intake/Output Summary (Last 24 hours) at 10/01/2019 1206 Last data filed at 10/01/2019 [id number] Gross per 24 hour  Intake 393.04 ml  Output 750 ml  Net -356.96 ml   Filed Weights   09/29/19 2345  Weight: 75.3 kg    ROS: Review of Systems  Constitutional: Negative for chills and fever.  Eyes: Negative for blurred vision.  Respiratory: Positive for shortness of breath. Negative for cough.   Cardiovascular: Positive for chest pain.  Gastrointestinal: Negative for abdominal pain, constipation, diarrhea, nausea and vomiting.  Genitourinary: Negative for dysuria.  Musculoskeletal: Positive for joint pain.  Neurological: Negative for dizziness and headaches.   Exam: Physical Exam  Constitutional: He is oriented to person, place, and time.  HENT:  Nose: No mucosal edema.  Mouth/Throat: No oropharyngeal exudate or posterior oropharyngeal edema.  Eyes: Pupils are equal, round, and reactive to light. Conjunctivae, EOM and lids are normal.  Neck: No JVD present. Carotid bruit is not present. No edema present. No thyroid mass and no thyromegaly present.  Cardiovascular: S1 normal and S2 normal. Exam reveals no gallop.  No murmur heard. Pulses:      Dorsalis pedis pulses are 2+ on the right side and 2+ on the left side.  Respiratory: No respiratory distress. He has decreased  breath sounds in the right lower field and the left lower field. He has no wheezes. He has no rhonchi. He has no rales.  GI: Soft. Bowel sounds are normal. There is no abdominal tenderness.  Musculoskeletal:     Right ankle: He exhibits no swelling.     Left ankle: He exhibits no swelling.  Lymphadenopathy:    He has no cervical adenopathy.  Neurological: He is alert and oriented to person, place, and time. No cranial nerve deficit.  Skin: Skin is warm. No rash noted. Nails show no clubbing.  No rash seen over the place where he has pain on his right side.  Psychiatric: He has a normal mood and affect.      Data Reviewed: Basic Metabolic Panel: Recent Labs  Lab 09/29/19 2349 09/30/19 0553  NA 136 137  K 3.9 4.1  CL 102 102  CO2 24 25  GLUCOSE 125* 112*  BUN 13 13  CREATININE 0.80 0.84  CALCIUM 9.1 8.8*   Liver Function Tests: Recent Labs  Lab 09/29/19 2349  AST 22  ALT 15  ALKPHOS 66  BILITOT 0.8  PROT 7.2  ALBUMIN 4.1   CBC: Recent Labs  Lab 09/29/19 2349 09/30/19 0553 10/01/19 0750  WBC 15.4* 18.9* 12.2*  NEUTROABS 14.0*  --   --   HGB 13.2 12.5* 11.8*  HCT 37.9* 35.0* 34.6*  MCV 95.5 93.8 98.0  PLT 182 152 134*   BNP (last 3 results) Recent Labs    09/29/19 2349  BNP 276.0*      Recent Results (from the past 240 hour(s))  Culture, blood (routine x 2)     Status: None (Preliminary result)   Collection Time: 09/29/19 11:49 PM   Specimen: BLOOD  Result Value Ref Range Status   Specimen Description BLOOD RIGHT ARM  Final   Special Requests   Final    BOTTLES DRAWN AEROBIC AND ANAEROBIC Blood Culture results may not be optimal due to an excessive volume of blood received in culture bottles   Culture   Final    NO GROWTH 1 DAY Performed at [facility], [address] [place], [place] [address]    Report Status PENDING  Incomplete  Culture, blood (routine x 2)     Status: None (Preliminary result)   Collection Time: 09/29/19 11:49  PM   Specimen: BLOOD  Result Value Ref Range Status   Specimen Description BLOOD RIGHT ASSIST CONTROL  Final   Special Requests   Final    BOTTLES DRAWN AEROBIC AND ANAEROBIC Blood Culture results may not be optimal due to an excessive volume of blood received in culture bottles   Culture   Final    NO GROWTH 1 DAY Performed at [facility], [address], [place], [place] [address]    Report Status PENDING  Incomplete  Urine culture     Status: None (Preliminary result)   Collection Time: 09/29/19 11:49 PM   Specimen: Urine, Random  Result Value Ref Range Status   Specimen Description   Final    URINE, RANDOM Performed at [facility], [address], [place], [place] [address]    Special Requests   Final    NONE Performed at [facility], [address], [place], [place] [address]    Culture   Final    CULTURE REINCUBATED FOR BETTER GROWTH Performed at [facility], [place] [address] [place], [place] [address]    Report Status PENDING  Incomplete  SARS CORONAVIRUS 2 (TAT 6-24 HRS) Nasopharyngeal Nasopharyngeal Swab     Status: None   Collection Time: 09/30/19 12:23 AM   Specimen: Nasopharyngeal Swab  Result Value Ref Range Status   SARS Coronavirus 2 NEGATIVE NEGATIVE Final    Comment: (NOTE) SARS-CoV-2 target nucleic acids are NOT DETECTED. The SARS-CoV-2 RNA is generally detectable in upper and lower respiratory specimens during the acute phase of infection. Negative results do not preclude SARS-CoV-2 infection, do not rule out co-infections with other pathogens, and should not be used as the sole basis for treatment or other patient management decisions. Negative results must be combined with clinical observations, patient history, and epidemiological information. The expected result is Negative. Fact Sheet for Patients: SugarRoll.be Fact Sheet for Healthcare  Providers: https://www.woods-mathews.com/ This test is not yet approved or cleared by the [place] FDA and  has been authorized for detection and/or diagnosis of SARS-CoV-2 by FDA under an Emergency Use Authorization (EUA). This EUA will remain  in effect (meaning this test can be used) for the duration of the COVID-19 declaration under Section 56 4(b)(1) of the Act, 21 U.S.C. section 360bbb-3(b)(1), unless the authorization is terminated or revoked sooner. Performed at [facility], [place] [address] [place], [place] [address]      Studies: Dg Chest Port 1 View  Result Date: 09/30/2019 CLINICAL DATA:  Shortness of breath EXAM: PORTABLE CHEST 1 VIEW COMPARISON:  Radiograph 10/18/2015 FINDINGS: Developing consolidation in the right lung base and right upper lung.  Some streaky opacity in the retrocardiac space as well. Mild airways thickening. No pneumothorax or visible effusion. Cardiomegaly similar to prior with a calcified, tortuous aorta. Pacer pack overlies the left chest wall with leads at the cardiac apex and right atrium. No acute osseous or soft tissue abnormality. Degenerative changes are present in the imaged spine and shoulders. IMPRESSION: 1. Developing consolidation in the right lung base and right upper lung, suspicious for pneumonia. 2. Streaky opacity in the retrocardiac space could reflect additional developing consolidation or atelectasis. 3. Cardiomegaly without edema. Electronically Signed   By: Lovena Le M.D.   On: 09/30/2019 00:16    Scheduled Meds: . aspirin EC  81 mg Oral Daily  . doxazosin  8 mg Oral QHS  . enoxaparin (LOVENOX) injection  40 mg Subcutaneous Q24H  . feeding supplement (ENSURE ENLIVE)  237 mL Oral BID BM  . fluticasone  1 spray Each Nare Daily  . ipratropium-albuterol  3 mL Nebulization Q6H  . latanoprost  1 drop Both Eyes QHS  . LORazepam  0.5 mg Oral TID  . montelukast  10 mg Oral QHS  . nebivolol  2.5 mg Oral TID  .  pantoprazole  20 mg Oral Daily  . prednisoLONE acetate  1 drop Right Eye Daily   Continuous Infusions: . levofloxacin (LEVAQUIN) IV 750 mg (09/30/19 2104)    Assessment/Plan:  1. Clinical sepsis, present on admission with lobar pneumonia in right lower lobe and right upper lobe.  Patient had fever, leukocytosis.  Patient with multiple allergies.  Levaquin prescribed.  Patient doing better today than yesterday.  Evaluate on a daily basis. 2. Acute kidney injury with creatinine at 0.84.  Baseline creatinine 0.69.  Check BMP tomorrow 3. COPD.  Continue nebulizer treatments.  Patient allergic to steroids so I will hold off on other inhalers or systemic steroids.  On Singulair. 4. Pleuritic pain right chest.  Likely from pneumonia.  With multiple allergies I will try Toradol. 5. Essential hypertension on nebivolol 6. Hyperlipidemia on atorvastatin 7. Weakness.  Physical therapy evaluation   Code Status:     Code Status Orders  (From admission, onward)         Start     Ordered   09/30/19 0240  Full code  Continuous     09/30/19 0239        Code Status History    Date Active Date Inactive Code Status Order ID Comments User Context   05/03/2019 0428 05/04/2019 1959 Full Code [id number]  Harrie Foreman, MD Inpatient   Advance Care Planning Activity     Family Communication: Spoke with son [name] on the phone Disposition Plan: To be determined  Antibiotics:  Levaquin  Time spent: 29 minutes  [place]

## 2019-10-02 LAB — BASIC METABOLIC PANEL
Anion gap: 11 (ref 5–15)
BUN: 27 mg/dL — ABNORMAL HIGH (ref 8–23)
CO2: 23 mmol/L (ref 22–32)
Calcium: 8.8 mg/dL — ABNORMAL LOW (ref 8.9–10.3)
Chloride: 104 mmol/L (ref 98–111)
Creatinine, Ser: 0.81 mg/dL (ref 0.61–1.24)
GFR calc Af Amer: >60 mL/min (ref >60)
GFR calc non Af Amer: >60 mL/min (ref >60)
Glucose, Bld: 125 mg/dL — ABNORMAL HIGH (ref 70–99)
Potassium: 4 mmol/L (ref 3.5–5.1)
Sodium: 138 mmol/L (ref 135–145)

## 2019-10-02 MED ORDER — LEVOFLOXACIN 500 MG PO TABS: 500.0000 mg | ORAL_TABLET | Freq: Every day | ORAL | 0 refills | Status: AC

## 2019-10-02 MED ORDER — ENSURE ENLIVE PO LIQD: 237.0000 mL | Freq: Two times a day (BID) | ORAL | 0 refills | Status: AC

## 2019-10-02 MED ORDER — LEVOFLOXACIN 500 MG PO TABS
500.0000 mg | ORAL_TABLET | Freq: Every day | ORAL | Status: DC
  Administered 2019-10-02: 500 mg via ORAL
  Filled 2019-10-02: qty 1

## 2019-10-02 NOTE — Discharge Instructions (Signed)
Community-Acquired Pneumonia, Adult °Pneumonia is an infection of the lungs. It causes swelling in the airways of the lungs. Mucus and fluid may also build up inside the airways. °One type of pneumonia can happen while a person is in a hospital. A different type can happen when a person is not in a hospital (community-acquired pneumonia).  °What are the causes? ° °This condition is caused by germs (viruses, bacteria, or fungi). Some types of germs can be passed from one person to another. This can happen when you breathe in droplets from the cough or sneeze of an infected person. °What increases the risk? °You are more likely to develop this condition if you: °· Have a long-term (chronic) disease, such as: °? Chronic obstructive pulmonary disease (COPD). °? Asthma. °? Cystic fibrosis. °? Congestive heart failure. °? Diabetes. °? Kidney disease. °· Have HIV. °· Have sickle cell disease. °· Have had your spleen removed. °· Do not take good care of your teeth and mouth (poor dental hygiene). °· Have a medical condition that increases the risk of breathing in droplets from your own mouth and nose. °· Have a weakened body defense system (immune system). °· Are a smoker. °· Travel to areas where the germs that cause this illness are common. °· Are around certain animals or the places they live. °What are the signs or symptoms? °· A dry cough. °· A wet (productive) cough. °· Fever. °· Sweating. °· Chest pain. This often happens when breathing deeply or coughing. °· Fast breathing or trouble breathing. °· Shortness of breath. °· Shaking chills. °· Feeling tired (fatigue). °· Muscle aches. °How is this treated? °Treatment for this condition depends on many things. Most adults can be treated at home. In some cases, treatment must happen in a hospital. Treatment may include: °· Medicines given by mouth or through an IV tube. °· Being given extra oxygen. °· Respiratory therapy. °In rare cases, treatment for very bad pneumonia  may include: °· Using a machine to help you breathe. °· Having a procedure to remove fluid from around your lungs. °Follow these instructions at home: °Medicines °· Take over-the-counter and prescription medicines only as told by your doctor. °? Only take cough medicine if you are losing sleep. °· If you were prescribed an antibiotic medicine, take it as told by your doctor. Do not stop taking the antibiotic even if you start to feel better. °General instructions ° °· Sleep with your head and neck raised (elevated). You can do this by sleeping in a recliner or by putting a few pillows under your head. °· Rest as needed. Get at least 8 hours of sleep each night. °· Drink enough water to keep your pee (urine) pale yellow. °· Eat a healthy diet that includes plenty of vegetables, fruits, whole grains, low-fat dairy products, and lean protein. °· Do not use any products that contain nicotine or tobacco. These include cigarettes, e-cigarettes, and chewing tobacco. If you need help quitting, ask your doctor. °· Keep all follow-up visits as told by your doctor. This is important. °How is this prevented? °A shot (vaccine) can help prevent pneumonia. Shots are often suggested for: °· People older than 83 years of age. °· People older than 83 years of age who: °? Are having cancer treatment. °? Have long-term (chronic) lung disease. °? Have problems with their body's defense system. °You may also prevent pneumonia if you take these actions: °· Get the flu (influenza) shot every year. °· Go to the dentist as   often as told. °· Wash your hands often. If you cannot use soap and water, use hand sanitizer. °Contact a doctor if: °· You have a fever. °· You lose sleep because your cough medicine does not help. °Get help right away if: °· You are short of breath and it gets worse. °· You have more chest pain. °· Your sickness gets worse. This is very serious if: °? You are an older adult. °? Your body's defense system is weak. °· You  cough up blood. °Summary °· Pneumonia is an infection of the lungs. °· Most adults can be treated at home. Some will need treatment in a hospital. °· Drink enough water to keep your pee pale yellow. °· Get at least 8 hours of sleep each night. °This information is not intended to replace advice given to you by your health care provider. Make sure you discuss any questions you have with your health care provider. °Document Released: 04/07/2008 Document Revised: 02/09/2019 Document Reviewed: 06/17/2018 °Elsevier Patient Education © 2020 Elsevier Inc. ° °

## 2019-10-02 NOTE — Discharge Summary (Signed)
[facility] [place] NAME: Darrell Jacobson    MR#:  [id number]  DATE OF BIRTH:  03-09-1928  DATE OF ADMISSION:  09/29/2019 ADMITTING PHYSICIAN: Edwin Dada, MD  DATE OF DISCHARGE: 10/02/2019 12:15 PM  PRIMARY CARE PHYSICIAN: Baxter Hire, MD    ADMISSION DIAGNOSIS:  Shortness of breath [R06.02] Fever, unspecified fever cause [R50.9] Community acquired pneumonia, unspecified laterality [J18.9]  DISCHARGE DIAGNOSIS:  Principal Problem:   Community acquired pneumonia Active Problems:   Essential hypertension   Chronic systolic CHF (congestive heart failure) (HCC)   Complete heart block ([place])   History of cerebrovascular disease   PVD (peripheral vascular disease) ([place])   Chronic obstructive pulmonary disease ([place])   Sepsis with acute renal failure without septic shock (HCC)   Lobar pneumonia (HCC)   Hyperlipidemia   Right-sided chest pain   SECONDARY DIAGNOSIS:   Past Medical History:  Diagnosis Date  . Arthritis   . Asthma    COPD with inhaler use  . Cancer [facility]) 2013   Basal Cell Skin CA resected from scalp.  . Carotid stenosis    pateint states he has Left carotid blockage worse than Right.   . CHF (congestive heart failure) ([place])   . Collagen vascular disease ([place])   . COPD (chronic obstructive pulmonary disease) ([place])   . Coronary artery disease   . Dyspnea   . GERD (gastroesophageal reflux disease)   . Glaucoma   . History of hiatal hernia   . History of shingles 2012   forehead  . Hypertension   . Multilevel degenerative disc disease   . Neuropathy    legs and feet  . Pacemaker 2006    HOSPITAL COURSE:   1.  Clinical sepsis, present on admission with lobar pneumonia in the right lower lobe and right upper lobe.  Patient had fever and leukocytosis.  The patient has multiple drug allergies and Levaquin was prescribed.  Patient received 4 doses of Levaquin in the hospital and will receive 3 more  doses of Levaquin upon going home. 2.  Acute kidney injury with a creatinine of 0.81 upon discharge.  His baseline creatinine is 0.69.  His creatinine of 0.81 may be his new baseline. 3.  COPD.  We gave nebulizer treatments during the hospital course.  Patient is allergic to steroids so systemic steroids were held.  Patient can go back on his usual inhalers.  He is also on Singulair.  The patient is moving much better air on discharge that he was on presentation. 4.  Pleuritic chest pain this is gone.  This is likely from the pneumonia that he had.  I did give Toradol while here in the hospital.  Since he has multiple allergies I will not prescribe anything upon going home. 5.  Essential hypertension on nebivolol 6.  Hyperlipidemia on atorvastatin 7.  Weakness.  Physical therapy recommended home with home health.  DISCHARGE CONDITIONS:   Fair  CONSULTS OBTAINED:  None  DRUG ALLERGIES:   Allergies  Allergen Reactions  . Prednisone Anxiety and Other (See Comments)  . Acyclovir Other (See Comments)  . Albuterol Sulfate Other (See Comments)  . Amlodipine Other (See Comments)  . Brinzolamide Other (See Comments)  . Budesonide-Formoterol Fumarate Other (See Comments)  . Buspirone Other (See Comments)  . Cefdinir Other (See Comments)  . Celecoxib Other (See Comments)  . Chlorthalidone Other (See Comments)  . Clopidogrel Nausea Only  . Codeine Nausea And Vomiting  .  Diazepam Other (See Comments)  . Dicyclomine Other (See Comments)  . Duloxetine Other (See Comments)  . Famciclovir Other (See Comments)  . Felodipine Other (See Comments)  . Furosemide Other (See Comments)  . Gabapentin Swelling  . Hydrochlorothiazide Other (See Comments)  . Indomethacin Other (See Comments)  . Lisinopril Other (See Comments)  . Losartan Other (See Comments)  . Meclizine Other (See Comments)  . Meloxicam Other (See Comments)  . Metaxalone Other (See Comments)  . Metoprolol Tartrate Other (See  Comments)  . Morphine Other (See Comments)  . Nabumetone Other (See Comments)  . Naproxen Other (See Comments)  . Olmesartan Other (See Comments)  . Oxycodone     hallucinations  . Penicillin V Potassium Other (See Comments)  . Prasugrel Other (See Comments)    weakness  . Promethazine Other (See Comments)  . Sulfa Antibiotics Other (See Comments)  . Torsemide Other (See Comments)  . Tramadol Other (See Comments)  . Zolpidem Other (See Comments)    DISCHARGE MEDICATIONS:   Allergies as of 10/02/2019      Reactions   Prednisone Anxiety, Other (See Comments)   Acyclovir Other (See Comments)   Albuterol Sulfate Other (See Comments)   Amlodipine Other (See Comments)   Brinzolamide Other (See Comments)   Budesonide-formoterol Fumarate Other (See Comments)   Buspirone Other (See Comments)   Cefdinir Other (See Comments)   Celecoxib Other (See Comments)   Chlorthalidone Other (See Comments)   Clopidogrel Nausea Only   Codeine Nausea And Vomiting   Diazepam Other (See Comments)   Dicyclomine Other (See Comments)   Duloxetine Other (See Comments)   Famciclovir Other (See Comments)   Felodipine Other (See Comments)   Furosemide Other (See Comments)   Gabapentin Swelling   Hydrochlorothiazide Other (See Comments)   Indomethacin Other (See Comments)   Lisinopril Other (See Comments)   Losartan Other (See Comments)   Meclizine Other (See Comments)   Meloxicam Other (See Comments)   Metaxalone Other (See Comments)   Metoprolol Tartrate Other (See Comments)   Morphine Other (See Comments)   Nabumetone Other (See Comments)   Naproxen Other (See Comments)   Olmesartan Other (See Comments)   Oxycodone    hallucinations   Penicillin V Potassium Other (See Comments)   Prasugrel Other (See Comments)   weakness   Promethazine Other (See Comments)   Sulfa Antibiotics Other (See Comments)   Torsemide Other (See Comments)   Tramadol Other (See Comments)   Zolpidem Other (See  Comments)      Medication List    TAKE these medications   aspirin EC 81 MG tablet Take 1 tablet by mouth daily.   Breo Ellipta 100-25 MCG/INH Aepb Generic drug: fluticasone furoate-vilanterol Inhale 1 puff into the lungs daily.   D 2000 50 MCG (2000 UT) Tabs Generic drug: Cholecalciferol Take 1 tablet by mouth daily.   doxazosin 8 MG tablet Commonly known as: CARDURA Take 1 tablet by mouth at bedtime.   feeding supplement (ENSURE ENLIVE) Liqd Take 237 mLs by mouth 2 (two) times daily between meals.   levofloxacin 500 MG tablet Commonly known as: LEVAQUIN Take 1 tablet (500 mg total) by mouth daily for 3 days. Start taking on: October 03, 2019   LORazepam 0.5 MG tablet Commonly known as: ATIVAN Take 1 tablet (0.5 mg total) by mouth 2 (two) times daily as needed for anxiety. What changed: when to take this   Lumigan 0.01 % Soln Generic drug: bimatoprost Apply 1 Dose to eye  at bedtime.   montelukast 10 MG tablet Commonly known as: SINGULAIR Take 1 tablet by mouth at bedtime.   Multi-Vitamins Tabs Take 1 tablet by mouth daily.   nebivolol 2.5 MG tablet Commonly known as: BYSTOLIC Take 1 tablet by mouth 3 (three) times daily.   omeprazole 20 MG capsule Commonly known as: PRILOSEC Take 1 capsule by mouth daily.   prednisoLONE acetate 1 % ophthalmic suspension Commonly known as: PRED FORTE Place 1 drop into the right eye daily.   ProAir HFA 108 (90 Base) MCG/ACT inhaler Generic drug: albuterol Inhale 2 puffs into the lungs every 6 (six) hours as needed.   SLOW FE PO Take by mouth.   valACYclovir 500 MG tablet Commonly known as: VALTREX Take 500 mg by mouth daily.        DISCHARGE INSTRUCTIONS:   Follow-up PMD 5 days  If you experience worsening of your admission symptoms, develop shortness of breath, life threatening emergency, suicidal or homicidal thoughts you must seek medical attention immediately by calling 911 or calling your MD immediately   if symptoms less severe.  You Must read complete instructions/literature along with all the possible adverse reactions/side effects for all the Medicines you take and that have been prescribed to you. Take any new Medicines after you have completely understood and accept all the possible adverse reactions/side effects.   Please note  You were cared for by a hospitalist during your hospital stay. If you have any questions about your discharge medications or the care you received while you were in the hospital after you are discharged, you can call the unit and asked to speak with the hospitalist on call if the hospitalist that took care of you is not available. Once you are discharged, your primary care physician will handle any further medical issues. Please note that NO REFILLS for any discharge medications will be authorized once you are discharged, as it is imperative that you return to your primary care physician (or establish a relationship with a primary care physician if you do not have one) for your aftercare needs so that they can reassess your need for medications and monitor your lab values.    Today   CHIEF COMPLAINT:   Chief Complaint  Patient presents with  . Shortness of Breath    HISTORY OF PRESENT ILLNESS:  Darrell Jacobson  is a 85 y.o. male presented with shortness of breath and right-sided chest pain.   VITAL SIGNS:  Blood pressure (!) 116/54, pulse 86, temperature 97.8 F (36.6 C), temperature source Oral, resp. rate 17, height 5\' 6"  (1.676 m), weight 75.3 kg, SpO2 90 %.  I/O:    Intake/Output Summary (Last 24 hours) at 10/02/2019 1437 Last data filed at 10/02/2019 1107 Gross per 24 hour  Intake 619.47 ml  Output 1100 ml  Net -480.53 ml    PHYSICAL EXAMINATION:  GENERAL:  85 y.o.-year-old patient lying in the bed with no acute distress.  EYES: Pupils equal, round, reactive to light and accommodation. No scleral icterus. HEENT: Head atraumatic,  normocephalic. Oropharynx and nasopharynx clear.  NECK:  Supple, no jugular venous distention. No thyroid enlargement, no tenderness.  LUNGS: Normal breath sounds bilaterally, no wheezing, rales,rhonchi or crepitation. No use of accessory muscles of respiration.  CARDIOVASCULAR: S1, S2 normal. No murmurs, rubs, or gallops.  ABDOMEN: Soft, non-tender, non-distended. Bowel sounds present. No organomegaly or mass.  EXTREMITIES: No pedal edema, cyanosis, or clubbing.  NEUROLOGIC: Cranial nerves II through XII are intact. Muscle strength 5/5  in all extremities. Sensation intact. Gait not checked.  PSYCHIATRIC: The patient is alert and oriented x 3.  SKIN: No obvious rash, lesion, or ulcer.   DATA REVIEW:   CBC Recent Labs  Lab 10/01/19 0750  WBC 12.2*  HGB 11.8*  HCT 34.6*  PLT 134*    Chemistries  Recent Labs  Lab 09/29/19 2349  10/02/19 0442  NA 136   < > 138  K 3.9   < > 4.0  CL 102   < > 104  CO2 24   < > 23  GLUCOSE 125*   < > 125*  BUN 13   < > 27*  CREATININE 0.80   < > 0.81  CALCIUM 9.1   < > 8.8*  AST 22  --   --   ALT 15  --   --   ALKPHOS 66  --   --   BILITOT 0.8  --   --    < > = values in this interval not displayed.     Microbiology Results  Results for orders placed or performed during the hospital encounter of 09/29/19  Culture, blood (routine x 2)     Status: None (Preliminary result)   Collection Time: 09/29/19 11:49 PM   Specimen: BLOOD  Result Value Ref Range Status   Specimen Description BLOOD RIGHT ARM  Final   Special Requests   Final    BOTTLES DRAWN AEROBIC AND ANAEROBIC Blood Culture results may not be optimal due to an excessive volume of blood received in culture bottles   Culture   Final    NO GROWTH 2 DAYS Performed at [facility], [address] [place], [place] [address]    Report Status PENDING  Incomplete  Culture, blood (routine x 2)     Status: None (Preliminary result)   Collection Time: 09/29/19 11:49 PM    Specimen: BLOOD  Result Value Ref Range Status   Specimen Description BLOOD RIGHT ASSIST CONTROL  Final   Special Requests   Final    BOTTLES DRAWN AEROBIC AND ANAEROBIC Blood Culture results may not be optimal due to an excessive volume of blood received in culture bottles   Culture   Final    NO GROWTH 2 DAYS Performed at [facility], [address] [place], [place] [address]    Report Status PENDING  Incomplete  Urine culture     Status: Abnormal (Preliminary result)   Collection Time: 09/29/19 11:49 PM   Specimen: Urine, Random  Result Value Ref Range Status   Specimen Description   Final    URINE, RANDOM Performed at [facility], [address] [place], [place] [address]    Special Requests   Final    NONE Performed at [facility], [address] [place], [place] [address]    Culture (A)  Final    >=100,000 COLONIES/mL STAPHYLOCOCCUS EPIDERMIDIS SUSCEPTIBILITIES TO FOLLOW Performed at [facility], [place] [address] [place], [place] [address]    Report Status PENDING  Incomplete  SARS CORONAVIRUS 2 (TAT 6-24 HRS) Nasopharyngeal Nasopharyngeal Swab     Status: None   Collection Time: 09/30/19 12:23 AM   Specimen: Nasopharyngeal Swab  Result Value Ref Range Status   SARS Coronavirus 2 NEGATIVE NEGATIVE Final    Comment: (NOTE) SARS-CoV-2 target nucleic acids are NOT DETECTED. The SARS-CoV-2 RNA is generally detectable in upper and lower respiratory specimens during the acute phase of infection. Negative results do not  preclude SARS-CoV-2 infection, do not rule out co-infections with other pathogens, and should not be used as the sole basis for treatment or other patient management decisions. Negative results must be combined with clinical observations, patient history, and epidemiological information. The expected result is Negative. Fact Sheet for Patients: SugarRoll.be Fact Sheet for Healthcare  Providers: https://www.woods-mathews.com/ This test is not yet approved or cleared by the [place] FDA and  has been authorized for detection and/or diagnosis of SARS-CoV-2 by FDA under an Emergency Use Authorization (EUA). This EUA will remain  in effect (meaning this test can be used) for the duration of the COVID-19 declaration under Section 56 4(b)(1) of the Act, 21 U.S.C. section 360bbb-3(b)(1), unless the authorization is terminated or revoked sooner. Performed at [facility], [place] [address], [place], [place] [address]      Management plans discussed with the patient, family (son on the phone) and they are in agreement.  CODE STATUS:     Code Status Orders  (From admission, onward)         Start     Ordered   09/30/19 0240  Full code  Continuous     09/30/19 0239        Code Status History    Date Active Date Inactive Code Status Order ID Comments User Context   05/03/2019 0428 05/04/2019 1959 Full Code [id number]  Harrie Foreman, MD Inpatient   Advance Care Planning Activity      TOTAL TIME TAKING CARE OF THIS PATIENT: 35 minutes.    Loletha Grayer M.D on 10/02/2019 at 2:37 PM  Between 7am to 6pm - Pager - [phone]  After 6pm go to [email or web address] - password [email or web address] [place]  [facility]  CC: Primary care physician; Baxter Hire, MD

## 2019-10-02 NOTE — Progress Notes (Signed)
Received MD order to discharge patient to home with home health, reviewed discharge instructions, follow up appointments,  home meds and prescriptions with patient and patient verbalized understanding

## 2019-10-02 NOTE — TOC Transition Note (Signed)
Transition of Care [facility]) - CM/SW Discharge Note   Patient Details  Name: Darrell Jacobson MRN: [id number] Date of Birth: [birth year]  [facility]) CM/SW Contact:  Ross Ludwig, LCSW Phone Number: 10/02/2019, 12:14 PM   Clinical Narrative:    Patient will be discharging back home with home health through [facility], [name] is aware and can accept patient.   Final next level of care: [place] Barriers to Discharge: Barriers Resolved   Patient Goals and CMS Choice Patient states their goals for this hospitalization and ongoing recovery are:: To return back home CMS Medicare.gov Compare Post Acute Care list provided to:: Patient Choice offered to / list presented to : Patient  Discharge Placement  Patient discharging back home.                     Discharge Plan and Services                DME Arranged: N/A DME Agency: NA       HH Arranged: RN, PT, OT, Nurse's Aide [place] Agency: [facility] Date [place] Agency Contacted: 10/02/19 Time [place]: [phone] Representative spoke with at [place]: [place] Determinants of Health ([place]) Interventions     Readmission Risk Interventions No flowsheet data found.

## 2019-10-03 LAB — URINE CULTURE: Culture: >=100000 — AB

## 2019-10-06 DIAGNOSIS — J189 Pneumonia, unspecified organism: Secondary | ICD-10-CM | POA: Diagnosis not present

## 2019-10-06 DIAGNOSIS — I2510 Atherosclerotic heart disease of native coronary artery without angina pectoris: Secondary | ICD-10-CM | POA: Diagnosis not present

## 2019-10-06 DIAGNOSIS — N179 Acute kidney failure, unspecified: Secondary | ICD-10-CM | POA: Diagnosis not present

## 2019-10-06 DIAGNOSIS — I110 Hypertensive heart disease with heart failure: Secondary | ICD-10-CM | POA: Diagnosis not present

## 2019-10-06 DIAGNOSIS — J449 Chronic obstructive pulmonary disease, unspecified: Secondary | ICD-10-CM | POA: Diagnosis not present

## 2019-10-06 DIAGNOSIS — E785 Hyperlipidemia, unspecified: Secondary | ICD-10-CM | POA: Diagnosis not present

## 2019-10-06 DIAGNOSIS — I517 Cardiomegaly: Secondary | ICD-10-CM | POA: Diagnosis not present

## 2019-10-06 DIAGNOSIS — I739 Peripheral vascular disease, unspecified: Secondary | ICD-10-CM | POA: Diagnosis not present

## 2019-10-06 DIAGNOSIS — I5022 Chronic systolic (congestive) heart failure: Secondary | ICD-10-CM | POA: Diagnosis not present

## 2019-10-07 DIAGNOSIS — J432 Centrilobular emphysema: Secondary | ICD-10-CM | POA: Diagnosis not present

## 2019-10-07 DIAGNOSIS — R0600 Dyspnea, unspecified: Secondary | ICD-10-CM | POA: Diagnosis not present

## 2019-10-07 DIAGNOSIS — R918 Other nonspecific abnormal finding of lung field: Secondary | ICD-10-CM | POA: Diagnosis not present

## 2019-10-10 LAB — CULTURE, BLOOD (ROUTINE X 2)
Culture: NO GROWTH
Culture: NO GROWTH

## 2019-10-11 DIAGNOSIS — R0602 Shortness of breath: Secondary | ICD-10-CM | POA: Diagnosis not present

## 2019-10-11 DIAGNOSIS — Z87891 Personal history of nicotine dependence: Secondary | ICD-10-CM | POA: Diagnosis not present

## 2019-10-11 DIAGNOSIS — G629 Polyneuropathy, unspecified: Secondary | ICD-10-CM | POA: Diagnosis not present

## 2019-10-12 DIAGNOSIS — I10 Essential (primary) hypertension: Secondary | ICD-10-CM | POA: Diagnosis not present

## 2019-10-12 DIAGNOSIS — H409 Unspecified glaucoma: Secondary | ICD-10-CM | POA: Diagnosis not present

## 2019-10-12 DIAGNOSIS — E559 Vitamin D deficiency, unspecified: Secondary | ICD-10-CM | POA: Diagnosis not present

## 2019-10-12 DIAGNOSIS — I69398 Other sequelae of cerebral infarction: Secondary | ICD-10-CM | POA: Diagnosis not present

## 2019-10-12 DIAGNOSIS — R2689 Other abnormalities of gait and mobility: Secondary | ICD-10-CM | POA: Diagnosis not present

## 2019-10-12 DIAGNOSIS — M1990 Unspecified osteoarthritis, unspecified site: Secondary | ICD-10-CM | POA: Diagnosis not present

## 2019-10-12 DIAGNOSIS — J449 Chronic obstructive pulmonary disease, unspecified: Secondary | ICD-10-CM | POA: Diagnosis not present

## 2019-10-12 DIAGNOSIS — F329 Major depressive disorder, single episode, unspecified: Secondary | ICD-10-CM | POA: Diagnosis not present

## 2019-10-12 DIAGNOSIS — I6529 Occlusion and stenosis of unspecified carotid artery: Secondary | ICD-10-CM | POA: Diagnosis not present

## 2019-10-17 DIAGNOSIS — E559 Vitamin D deficiency, unspecified: Secondary | ICD-10-CM | POA: Diagnosis not present

## 2019-10-17 DIAGNOSIS — H409 Unspecified glaucoma: Secondary | ICD-10-CM | POA: Diagnosis not present

## 2019-10-17 DIAGNOSIS — F329 Major depressive disorder, single episode, unspecified: Secondary | ICD-10-CM | POA: Diagnosis not present

## 2019-10-17 DIAGNOSIS — I69398 Other sequelae of cerebral infarction: Secondary | ICD-10-CM | POA: Diagnosis not present

## 2019-10-17 DIAGNOSIS — R2689 Other abnormalities of gait and mobility: Secondary | ICD-10-CM | POA: Diagnosis not present

## 2019-10-17 DIAGNOSIS — M1990 Unspecified osteoarthritis, unspecified site: Secondary | ICD-10-CM | POA: Diagnosis not present

## 2019-10-17 DIAGNOSIS — I10 Essential (primary) hypertension: Secondary | ICD-10-CM | POA: Diagnosis not present

## 2019-10-17 DIAGNOSIS — J449 Chronic obstructive pulmonary disease, unspecified: Secondary | ICD-10-CM | POA: Diagnosis not present

## 2019-10-17 DIAGNOSIS — I6529 Occlusion and stenosis of unspecified carotid artery: Secondary | ICD-10-CM | POA: Diagnosis not present

## 2019-10-21 DIAGNOSIS — I6529 Occlusion and stenosis of unspecified carotid artery: Secondary | ICD-10-CM | POA: Diagnosis not present

## 2019-10-21 DIAGNOSIS — J449 Chronic obstructive pulmonary disease, unspecified: Secondary | ICD-10-CM | POA: Diagnosis not present

## 2019-10-21 DIAGNOSIS — R2689 Other abnormalities of gait and mobility: Secondary | ICD-10-CM | POA: Diagnosis not present

## 2019-10-21 DIAGNOSIS — H409 Unspecified glaucoma: Secondary | ICD-10-CM | POA: Diagnosis not present

## 2019-10-21 DIAGNOSIS — E559 Vitamin D deficiency, unspecified: Secondary | ICD-10-CM | POA: Diagnosis not present

## 2019-10-21 DIAGNOSIS — M1990 Unspecified osteoarthritis, unspecified site: Secondary | ICD-10-CM | POA: Diagnosis not present

## 2019-10-21 DIAGNOSIS — I10 Essential (primary) hypertension: Secondary | ICD-10-CM | POA: Diagnosis not present

## 2019-10-21 DIAGNOSIS — F329 Major depressive disorder, single episode, unspecified: Secondary | ICD-10-CM | POA: Diagnosis not present

## 2019-10-21 DIAGNOSIS — I69398 Other sequelae of cerebral infarction: Secondary | ICD-10-CM | POA: Diagnosis not present

## 2019-10-26 DIAGNOSIS — I69354 Hemiplegia and hemiparesis following cerebral infarction affecting left non-dominant side: Secondary | ICD-10-CM | POA: Diagnosis not present

## 2019-10-26 DIAGNOSIS — I69398 Other sequelae of cerebral infarction: Secondary | ICD-10-CM | POA: Diagnosis not present

## 2019-10-26 DIAGNOSIS — M1990 Unspecified osteoarthritis, unspecified site: Secondary | ICD-10-CM | POA: Diagnosis not present

## 2019-10-26 DIAGNOSIS — I10 Essential (primary) hypertension: Secondary | ICD-10-CM | POA: Diagnosis not present

## 2019-10-26 DIAGNOSIS — R2689 Other abnormalities of gait and mobility: Secondary | ICD-10-CM | POA: Diagnosis not present

## 2019-10-26 DIAGNOSIS — I6529 Occlusion and stenosis of unspecified carotid artery: Secondary | ICD-10-CM | POA: Diagnosis not present

## 2019-10-26 DIAGNOSIS — H409 Unspecified glaucoma: Secondary | ICD-10-CM | POA: Diagnosis not present

## 2019-10-26 DIAGNOSIS — F329 Major depressive disorder, single episode, unspecified: Secondary | ICD-10-CM | POA: Diagnosis not present

## 2019-10-26 DIAGNOSIS — J449 Chronic obstructive pulmonary disease, unspecified: Secondary | ICD-10-CM | POA: Diagnosis not present

## 2019-10-27 DIAGNOSIS — J449 Chronic obstructive pulmonary disease, unspecified: Secondary | ICD-10-CM | POA: Diagnosis not present

## 2019-10-27 DIAGNOSIS — I5022 Chronic systolic (congestive) heart failure: Secondary | ICD-10-CM | POA: Diagnosis not present

## 2019-10-27 DIAGNOSIS — U071 COVID-19: Secondary | ICD-10-CM | POA: Diagnosis not present

## 2019-10-27 DIAGNOSIS — M6281 Muscle weakness (generalized): Secondary | ICD-10-CM | POA: Diagnosis not present

## 2019-10-31 DIAGNOSIS — R2689 Other abnormalities of gait and mobility: Secondary | ICD-10-CM | POA: Diagnosis not present

## 2019-10-31 DIAGNOSIS — H409 Unspecified glaucoma: Secondary | ICD-10-CM | POA: Diagnosis not present

## 2019-10-31 DIAGNOSIS — I69398 Other sequelae of cerebral infarction: Secondary | ICD-10-CM | POA: Diagnosis not present

## 2019-10-31 DIAGNOSIS — F329 Major depressive disorder, single episode, unspecified: Secondary | ICD-10-CM | POA: Diagnosis not present

## 2019-10-31 DIAGNOSIS — M1990 Unspecified osteoarthritis, unspecified site: Secondary | ICD-10-CM | POA: Diagnosis not present

## 2019-10-31 DIAGNOSIS — I10 Essential (primary) hypertension: Secondary | ICD-10-CM | POA: Diagnosis not present

## 2019-10-31 DIAGNOSIS — I6529 Occlusion and stenosis of unspecified carotid artery: Secondary | ICD-10-CM | POA: Diagnosis not present

## 2019-10-31 DIAGNOSIS — I69354 Hemiplegia and hemiparesis following cerebral infarction affecting left non-dominant side: Secondary | ICD-10-CM | POA: Diagnosis not present

## 2019-10-31 DIAGNOSIS — J449 Chronic obstructive pulmonary disease, unspecified: Secondary | ICD-10-CM | POA: Diagnosis not present

## 2019-11-02 DIAGNOSIS — I10 Essential (primary) hypertension: Secondary | ICD-10-CM | POA: Diagnosis not present

## 2019-11-02 DIAGNOSIS — R2689 Other abnormalities of gait and mobility: Secondary | ICD-10-CM | POA: Diagnosis not present

## 2019-11-02 DIAGNOSIS — J449 Chronic obstructive pulmonary disease, unspecified: Secondary | ICD-10-CM | POA: Diagnosis not present

## 2019-11-02 DIAGNOSIS — M1990 Unspecified osteoarthritis, unspecified site: Secondary | ICD-10-CM | POA: Diagnosis not present

## 2019-11-02 DIAGNOSIS — I69398 Other sequelae of cerebral infarction: Secondary | ICD-10-CM | POA: Diagnosis not present

## 2019-11-02 DIAGNOSIS — I6529 Occlusion and stenosis of unspecified carotid artery: Secondary | ICD-10-CM | POA: Diagnosis not present

## 2019-11-02 DIAGNOSIS — I69354 Hemiplegia and hemiparesis following cerebral infarction affecting left non-dominant side: Secondary | ICD-10-CM | POA: Diagnosis not present

## 2019-11-02 DIAGNOSIS — F329 Major depressive disorder, single episode, unspecified: Secondary | ICD-10-CM | POA: Diagnosis not present

## 2019-11-02 DIAGNOSIS — H409 Unspecified glaucoma: Secondary | ICD-10-CM | POA: Diagnosis not present

## 2019-11-08 DIAGNOSIS — R2689 Other abnormalities of gait and mobility: Secondary | ICD-10-CM | POA: Diagnosis not present

## 2019-11-08 DIAGNOSIS — M1990 Unspecified osteoarthritis, unspecified site: Secondary | ICD-10-CM | POA: Diagnosis not present

## 2019-11-08 DIAGNOSIS — I69354 Hemiplegia and hemiparesis following cerebral infarction affecting left non-dominant side: Secondary | ICD-10-CM | POA: Diagnosis not present

## 2019-11-08 DIAGNOSIS — I6529 Occlusion and stenosis of unspecified carotid artery: Secondary | ICD-10-CM | POA: Diagnosis not present

## 2019-11-08 DIAGNOSIS — F329 Major depressive disorder, single episode, unspecified: Secondary | ICD-10-CM | POA: Diagnosis not present

## 2019-11-08 DIAGNOSIS — I10 Essential (primary) hypertension: Secondary | ICD-10-CM | POA: Diagnosis not present

## 2019-11-08 DIAGNOSIS — J449 Chronic obstructive pulmonary disease, unspecified: Secondary | ICD-10-CM | POA: Diagnosis not present

## 2019-11-08 DIAGNOSIS — I69398 Other sequelae of cerebral infarction: Secondary | ICD-10-CM | POA: Diagnosis not present

## 2019-11-08 DIAGNOSIS — H409 Unspecified glaucoma: Secondary | ICD-10-CM | POA: Diagnosis not present

## 2019-11-16 DIAGNOSIS — I10 Essential (primary) hypertension: Secondary | ICD-10-CM | POA: Diagnosis not present

## 2019-11-16 DIAGNOSIS — R2689 Other abnormalities of gait and mobility: Secondary | ICD-10-CM | POA: Diagnosis not present

## 2019-11-16 DIAGNOSIS — I69354 Hemiplegia and hemiparesis following cerebral infarction affecting left non-dominant side: Secondary | ICD-10-CM | POA: Diagnosis not present

## 2019-11-16 DIAGNOSIS — F329 Major depressive disorder, single episode, unspecified: Secondary | ICD-10-CM | POA: Diagnosis not present

## 2019-11-16 DIAGNOSIS — I69398 Other sequelae of cerebral infarction: Secondary | ICD-10-CM | POA: Diagnosis not present

## 2019-11-16 DIAGNOSIS — I6529 Occlusion and stenosis of unspecified carotid artery: Secondary | ICD-10-CM | POA: Diagnosis not present

## 2019-11-16 DIAGNOSIS — J449 Chronic obstructive pulmonary disease, unspecified: Secondary | ICD-10-CM | POA: Diagnosis not present

## 2019-11-16 DIAGNOSIS — H409 Unspecified glaucoma: Secondary | ICD-10-CM | POA: Diagnosis not present

## 2019-11-16 DIAGNOSIS — M1990 Unspecified osteoarthritis, unspecified site: Secondary | ICD-10-CM | POA: Diagnosis not present

## 2019-11-21 DIAGNOSIS — I6529 Occlusion and stenosis of unspecified carotid artery: Secondary | ICD-10-CM | POA: Diagnosis not present

## 2019-11-21 DIAGNOSIS — F329 Major depressive disorder, single episode, unspecified: Secondary | ICD-10-CM | POA: Diagnosis not present

## 2019-11-21 DIAGNOSIS — I69398 Other sequelae of cerebral infarction: Secondary | ICD-10-CM | POA: Diagnosis not present

## 2019-11-21 DIAGNOSIS — R2689 Other abnormalities of gait and mobility: Secondary | ICD-10-CM | POA: Diagnosis not present

## 2019-11-21 DIAGNOSIS — H409 Unspecified glaucoma: Secondary | ICD-10-CM | POA: Diagnosis not present

## 2019-11-21 DIAGNOSIS — M1990 Unspecified osteoarthritis, unspecified site: Secondary | ICD-10-CM | POA: Diagnosis not present

## 2019-11-21 DIAGNOSIS — I69354 Hemiplegia and hemiparesis following cerebral infarction affecting left non-dominant side: Secondary | ICD-10-CM | POA: Diagnosis not present

## 2019-11-21 DIAGNOSIS — J449 Chronic obstructive pulmonary disease, unspecified: Secondary | ICD-10-CM | POA: Diagnosis not present

## 2019-11-21 DIAGNOSIS — I10 Essential (primary) hypertension: Secondary | ICD-10-CM | POA: Diagnosis not present

## 2019-11-25 DIAGNOSIS — I2510 Atherosclerotic heart disease of native coronary artery without angina pectoris: Secondary | ICD-10-CM | POA: Diagnosis not present

## 2019-11-25 DIAGNOSIS — R2689 Other abnormalities of gait and mobility: Secondary | ICD-10-CM | POA: Diagnosis not present

## 2019-11-25 DIAGNOSIS — I5022 Chronic systolic (congestive) heart failure: Secondary | ICD-10-CM | POA: Diagnosis not present

## 2019-11-25 DIAGNOSIS — I110 Hypertensive heart disease with heart failure: Secondary | ICD-10-CM | POA: Diagnosis not present

## 2019-11-25 DIAGNOSIS — J449 Chronic obstructive pulmonary disease, unspecified: Secondary | ICD-10-CM | POA: Diagnosis not present

## 2019-11-25 DIAGNOSIS — I739 Peripheral vascular disease, unspecified: Secondary | ICD-10-CM | POA: Diagnosis not present

## 2019-11-25 DIAGNOSIS — I69398 Other sequelae of cerebral infarction: Secondary | ICD-10-CM | POA: Diagnosis not present

## 2019-11-25 DIAGNOSIS — I4891 Unspecified atrial fibrillation: Secondary | ICD-10-CM | POA: Diagnosis not present

## 2019-11-25 DIAGNOSIS — I69354 Hemiplegia and hemiparesis following cerebral infarction affecting left non-dominant side: Secondary | ICD-10-CM | POA: Diagnosis not present

## 2019-11-27 DIAGNOSIS — J449 Chronic obstructive pulmonary disease, unspecified: Secondary | ICD-10-CM | POA: Diagnosis not present

## 2019-11-27 DIAGNOSIS — I5022 Chronic systolic (congestive) heart failure: Secondary | ICD-10-CM | POA: Diagnosis not present

## 2019-11-27 DIAGNOSIS — M6281 Muscle weakness (generalized): Secondary | ICD-10-CM | POA: Diagnosis not present

## 2019-11-27 DIAGNOSIS — U071 COVID-19: Secondary | ICD-10-CM | POA: Diagnosis not present

## 2019-11-28 DIAGNOSIS — R2689 Other abnormalities of gait and mobility: Secondary | ICD-10-CM | POA: Diagnosis not present

## 2019-11-28 DIAGNOSIS — I5022 Chronic systolic (congestive) heart failure: Secondary | ICD-10-CM | POA: Diagnosis not present

## 2019-11-28 DIAGNOSIS — I4891 Unspecified atrial fibrillation: Secondary | ICD-10-CM | POA: Diagnosis not present

## 2019-11-28 DIAGNOSIS — I110 Hypertensive heart disease with heart failure: Secondary | ICD-10-CM | POA: Diagnosis not present

## 2019-11-28 DIAGNOSIS — I739 Peripheral vascular disease, unspecified: Secondary | ICD-10-CM | POA: Diagnosis not present

## 2019-11-28 DIAGNOSIS — I69354 Hemiplegia and hemiparesis following cerebral infarction affecting left non-dominant side: Secondary | ICD-10-CM | POA: Diagnosis not present

## 2019-11-28 DIAGNOSIS — J449 Chronic obstructive pulmonary disease, unspecified: Secondary | ICD-10-CM | POA: Diagnosis not present

## 2019-11-28 DIAGNOSIS — I2510 Atherosclerotic heart disease of native coronary artery without angina pectoris: Secondary | ICD-10-CM | POA: Diagnosis not present

## 2019-11-28 DIAGNOSIS — I69398 Other sequelae of cerebral infarction: Secondary | ICD-10-CM | POA: Diagnosis not present

## 2019-11-29 DIAGNOSIS — Z8673 Personal history of transient ischemic attack (TIA), and cerebral infarction without residual deficits: Secondary | ICD-10-CM | POA: Diagnosis not present

## 2019-11-29 DIAGNOSIS — F411 Generalized anxiety disorder: Secondary | ICD-10-CM | POA: Diagnosis not present

## 2019-11-29 DIAGNOSIS — Z87891 Personal history of nicotine dependence: Secondary | ICD-10-CM | POA: Diagnosis not present

## 2019-12-01 DIAGNOSIS — I5022 Chronic systolic (congestive) heart failure: Secondary | ICD-10-CM | POA: Diagnosis not present

## 2019-12-01 DIAGNOSIS — I110 Hypertensive heart disease with heart failure: Secondary | ICD-10-CM | POA: Diagnosis not present

## 2019-12-01 DIAGNOSIS — R2689 Other abnormalities of gait and mobility: Secondary | ICD-10-CM | POA: Diagnosis not present

## 2019-12-01 DIAGNOSIS — I2510 Atherosclerotic heart disease of native coronary artery without angina pectoris: Secondary | ICD-10-CM | POA: Diagnosis not present

## 2019-12-01 DIAGNOSIS — I69354 Hemiplegia and hemiparesis following cerebral infarction affecting left non-dominant side: Secondary | ICD-10-CM | POA: Diagnosis not present

## 2019-12-01 DIAGNOSIS — I739 Peripheral vascular disease, unspecified: Secondary | ICD-10-CM | POA: Diagnosis not present

## 2019-12-01 DIAGNOSIS — I4891 Unspecified atrial fibrillation: Secondary | ICD-10-CM | POA: Diagnosis not present

## 2019-12-01 DIAGNOSIS — I69398 Other sequelae of cerebral infarction: Secondary | ICD-10-CM | POA: Diagnosis not present

## 2019-12-01 DIAGNOSIS — J449 Chronic obstructive pulmonary disease, unspecified: Secondary | ICD-10-CM | POA: Diagnosis not present

## 2019-12-02 DIAGNOSIS — I6523 Occlusion and stenosis of bilateral carotid arteries: Secondary | ICD-10-CM | POA: Diagnosis not present

## 2019-12-02 DIAGNOSIS — R42 Dizziness and giddiness: Secondary | ICD-10-CM | POA: Diagnosis not present

## 2019-12-02 DIAGNOSIS — Z8673 Personal history of transient ischemic attack (TIA), and cerebral infarction without residual deficits: Secondary | ICD-10-CM | POA: Diagnosis not present

## 2019-12-02 DIAGNOSIS — I69393 Ataxia following cerebral infarction: Secondary | ICD-10-CM | POA: Diagnosis not present

## 2019-12-08 ENCOUNTER — Other Ambulatory Visit: Payer: Self-pay | Admitting: Neurology

## 2019-12-08 DIAGNOSIS — R2689 Other abnormalities of gait and mobility: Secondary | ICD-10-CM | POA: Diagnosis not present

## 2019-12-08 DIAGNOSIS — J449 Chronic obstructive pulmonary disease, unspecified: Secondary | ICD-10-CM | POA: Diagnosis not present

## 2019-12-08 DIAGNOSIS — I110 Hypertensive heart disease with heart failure: Secondary | ICD-10-CM | POA: Diagnosis not present

## 2019-12-08 DIAGNOSIS — I69393 Ataxia following cerebral infarction: Secondary | ICD-10-CM

## 2019-12-08 DIAGNOSIS — I69398 Other sequelae of cerebral infarction: Secondary | ICD-10-CM | POA: Diagnosis not present

## 2019-12-08 DIAGNOSIS — I739 Peripheral vascular disease, unspecified: Secondary | ICD-10-CM | POA: Diagnosis not present

## 2019-12-08 DIAGNOSIS — I4891 Unspecified atrial fibrillation: Secondary | ICD-10-CM | POA: Diagnosis not present

## 2019-12-08 DIAGNOSIS — R42 Dizziness and giddiness: Secondary | ICD-10-CM

## 2019-12-08 DIAGNOSIS — I5022 Chronic systolic (congestive) heart failure: Secondary | ICD-10-CM | POA: Diagnosis not present

## 2019-12-08 DIAGNOSIS — I2510 Atherosclerotic heart disease of native coronary artery without angina pectoris: Secondary | ICD-10-CM | POA: Diagnosis not present

## 2019-12-08 DIAGNOSIS — I69354 Hemiplegia and hemiparesis following cerebral infarction affecting left non-dominant side: Secondary | ICD-10-CM | POA: Diagnosis not present

## 2019-12-14 DIAGNOSIS — I10 Essential (primary) hypertension: Secondary | ICD-10-CM | POA: Diagnosis not present

## 2019-12-14 DIAGNOSIS — R2689 Other abnormalities of gait and mobility: Secondary | ICD-10-CM | POA: Diagnosis not present

## 2019-12-14 DIAGNOSIS — I739 Peripheral vascular disease, unspecified: Secondary | ICD-10-CM | POA: Diagnosis not present

## 2019-12-14 DIAGNOSIS — J449 Chronic obstructive pulmonary disease, unspecified: Secondary | ICD-10-CM | POA: Diagnosis not present

## 2019-12-14 DIAGNOSIS — I5022 Chronic systolic (congestive) heart failure: Secondary | ICD-10-CM | POA: Diagnosis not present

## 2019-12-14 DIAGNOSIS — I2510 Atherosclerotic heart disease of native coronary artery without angina pectoris: Secondary | ICD-10-CM | POA: Diagnosis not present

## 2019-12-14 DIAGNOSIS — I4891 Unspecified atrial fibrillation: Secondary | ICD-10-CM | POA: Diagnosis not present

## 2019-12-14 DIAGNOSIS — I69398 Other sequelae of cerebral infarction: Secondary | ICD-10-CM | POA: Diagnosis not present

## 2019-12-14 DIAGNOSIS — I69354 Hemiplegia and hemiparesis following cerebral infarction affecting left non-dominant side: Secondary | ICD-10-CM | POA: Diagnosis not present

## 2019-12-14 DIAGNOSIS — I110 Hypertensive heart disease with heart failure: Secondary | ICD-10-CM | POA: Diagnosis not present

## 2019-12-20 DIAGNOSIS — I442 Atrioventricular block, complete: Secondary | ICD-10-CM | POA: Diagnosis not present

## 2019-12-21 DIAGNOSIS — I110 Hypertensive heart disease with heart failure: Secondary | ICD-10-CM | POA: Diagnosis not present

## 2019-12-21 DIAGNOSIS — I2510 Atherosclerotic heart disease of native coronary artery without angina pectoris: Secondary | ICD-10-CM | POA: Diagnosis not present

## 2019-12-21 DIAGNOSIS — I5022 Chronic systolic (congestive) heart failure: Secondary | ICD-10-CM | POA: Diagnosis not present

## 2019-12-21 DIAGNOSIS — Z87891 Personal history of nicotine dependence: Secondary | ICD-10-CM | POA: Diagnosis not present

## 2019-12-21 DIAGNOSIS — Z0000 Encounter for general adult medical examination without abnormal findings: Secondary | ICD-10-CM | POA: Diagnosis not present

## 2019-12-21 DIAGNOSIS — I69398 Other sequelae of cerebral infarction: Secondary | ICD-10-CM | POA: Diagnosis not present

## 2019-12-24 DIAGNOSIS — R2689 Other abnormalities of gait and mobility: Secondary | ICD-10-CM | POA: Diagnosis not present

## 2019-12-24 DIAGNOSIS — I69354 Hemiplegia and hemiparesis following cerebral infarction affecting left non-dominant side: Secondary | ICD-10-CM | POA: Diagnosis not present

## 2019-12-24 DIAGNOSIS — I2510 Atherosclerotic heart disease of native coronary artery without angina pectoris: Secondary | ICD-10-CM | POA: Diagnosis not present

## 2019-12-24 DIAGNOSIS — I5022 Chronic systolic (congestive) heart failure: Secondary | ICD-10-CM | POA: Diagnosis not present

## 2019-12-24 DIAGNOSIS — I69398 Other sequelae of cerebral infarction: Secondary | ICD-10-CM | POA: Diagnosis not present

## 2019-12-24 DIAGNOSIS — I110 Hypertensive heart disease with heart failure: Secondary | ICD-10-CM | POA: Diagnosis not present

## 2019-12-24 DIAGNOSIS — I739 Peripheral vascular disease, unspecified: Secondary | ICD-10-CM | POA: Diagnosis not present

## 2019-12-24 DIAGNOSIS — I4891 Unspecified atrial fibrillation: Secondary | ICD-10-CM | POA: Diagnosis not present

## 2019-12-24 DIAGNOSIS — J449 Chronic obstructive pulmonary disease, unspecified: Secondary | ICD-10-CM | POA: Diagnosis not present

## 2019-12-27 ENCOUNTER — Ambulatory Visit
Admission: RE | Admit: 2019-12-27 | Discharge: 2019-12-27 | Disposition: A | Discharge status: Home / Self Care | Payer: Medicare HMO | Source: Ambulatory Visit | Attending: Neurology | Admitting: Neurology

## 2019-12-27 ENCOUNTER — Other Ambulatory Visit: Payer: Self-pay

## 2019-12-27 DIAGNOSIS — I6523 Occlusion and stenosis of bilateral carotid arteries: Secondary | ICD-10-CM | POA: Diagnosis not present

## 2019-12-27 DIAGNOSIS — R42 Dizziness and giddiness: Secondary | ICD-10-CM | POA: Insufficient documentation

## 2019-12-27 DIAGNOSIS — I69393 Ataxia following cerebral infarction: Secondary | ICD-10-CM

## 2019-12-27 MED ORDER — IOHEXOL 350 MG/ML SOLN
75.0000 mL | Freq: Once | INTRAVENOUS | Status: AC | PRN
  Administered 2019-12-27: 75 mL via INTRAVENOUS

## 2019-12-28 DIAGNOSIS — I5022 Chronic systolic (congestive) heart failure: Secondary | ICD-10-CM | POA: Diagnosis not present

## 2019-12-28 DIAGNOSIS — J449 Chronic obstructive pulmonary disease, unspecified: Secondary | ICD-10-CM | POA: Diagnosis not present

## 2019-12-28 DIAGNOSIS — U071 COVID-19: Secondary | ICD-10-CM | POA: Diagnosis not present

## 2019-12-28 DIAGNOSIS — M6281 Muscle weakness (generalized): Secondary | ICD-10-CM | POA: Diagnosis not present

## 2019-12-29 DIAGNOSIS — J449 Chronic obstructive pulmonary disease, unspecified: Secondary | ICD-10-CM | POA: Diagnosis not present

## 2019-12-29 DIAGNOSIS — I5022 Chronic systolic (congestive) heart failure: Secondary | ICD-10-CM | POA: Diagnosis not present

## 2019-12-29 DIAGNOSIS — I4891 Unspecified atrial fibrillation: Secondary | ICD-10-CM | POA: Diagnosis not present

## 2019-12-29 DIAGNOSIS — R2689 Other abnormalities of gait and mobility: Secondary | ICD-10-CM | POA: Diagnosis not present

## 2019-12-29 DIAGNOSIS — I69354 Hemiplegia and hemiparesis following cerebral infarction affecting left non-dominant side: Secondary | ICD-10-CM | POA: Diagnosis not present

## 2019-12-29 DIAGNOSIS — I69398 Other sequelae of cerebral infarction: Secondary | ICD-10-CM | POA: Diagnosis not present

## 2019-12-29 DIAGNOSIS — I2510 Atherosclerotic heart disease of native coronary artery without angina pectoris: Secondary | ICD-10-CM | POA: Diagnosis not present

## 2019-12-29 DIAGNOSIS — I739 Peripheral vascular disease, unspecified: Secondary | ICD-10-CM | POA: Diagnosis not present

## 2019-12-29 DIAGNOSIS — I110 Hypertensive heart disease with heart failure: Secondary | ICD-10-CM | POA: Diagnosis not present

## 2020-01-02 DIAGNOSIS — I2510 Atherosclerotic heart disease of native coronary artery without angina pectoris: Secondary | ICD-10-CM | POA: Diagnosis not present

## 2020-01-02 DIAGNOSIS — R2689 Other abnormalities of gait and mobility: Secondary | ICD-10-CM | POA: Diagnosis not present

## 2020-01-02 DIAGNOSIS — J449 Chronic obstructive pulmonary disease, unspecified: Secondary | ICD-10-CM | POA: Diagnosis not present

## 2020-01-02 DIAGNOSIS — I110 Hypertensive heart disease with heart failure: Secondary | ICD-10-CM | POA: Diagnosis not present

## 2020-01-02 DIAGNOSIS — I69398 Other sequelae of cerebral infarction: Secondary | ICD-10-CM | POA: Diagnosis not present

## 2020-01-02 DIAGNOSIS — I4891 Unspecified atrial fibrillation: Secondary | ICD-10-CM | POA: Diagnosis not present

## 2020-01-02 DIAGNOSIS — I69354 Hemiplegia and hemiparesis following cerebral infarction affecting left non-dominant side: Secondary | ICD-10-CM | POA: Diagnosis not present

## 2020-01-02 DIAGNOSIS — I739 Peripheral vascular disease, unspecified: Secondary | ICD-10-CM | POA: Diagnosis not present

## 2020-01-02 DIAGNOSIS — I5022 Chronic systolic (congestive) heart failure: Secondary | ICD-10-CM | POA: Diagnosis not present

## 2020-01-03 DIAGNOSIS — B0233 Zoster keratitis: Secondary | ICD-10-CM | POA: Diagnosis not present

## 2020-01-10 DIAGNOSIS — I5022 Chronic systolic (congestive) heart failure: Secondary | ICD-10-CM | POA: Diagnosis not present

## 2020-01-10 DIAGNOSIS — I2510 Atherosclerotic heart disease of native coronary artery without angina pectoris: Secondary | ICD-10-CM | POA: Diagnosis not present

## 2020-01-10 DIAGNOSIS — I10 Essential (primary) hypertension: Secondary | ICD-10-CM | POA: Diagnosis not present

## 2020-01-10 DIAGNOSIS — I739 Peripheral vascular disease, unspecified: Secondary | ICD-10-CM | POA: Diagnosis not present

## 2020-01-10 DIAGNOSIS — I442 Atrioventricular block, complete: Secondary | ICD-10-CM | POA: Diagnosis not present

## 2020-01-10 DIAGNOSIS — E782 Mixed hyperlipidemia: Secondary | ICD-10-CM | POA: Diagnosis not present

## 2020-01-10 DIAGNOSIS — I25118 Atherosclerotic heart disease of native coronary artery with other forms of angina pectoris: Secondary | ICD-10-CM | POA: Diagnosis not present

## 2020-01-10 DIAGNOSIS — I6523 Occlusion and stenosis of bilateral carotid arteries: Secondary | ICD-10-CM | POA: Diagnosis not present

## 2020-01-25 DIAGNOSIS — J449 Chronic obstructive pulmonary disease, unspecified: Secondary | ICD-10-CM | POA: Diagnosis not present

## 2020-01-25 DIAGNOSIS — U071 COVID-19: Secondary | ICD-10-CM | POA: Diagnosis not present

## 2020-01-25 DIAGNOSIS — M6281 Muscle weakness (generalized): Secondary | ICD-10-CM | POA: Diagnosis not present

## 2020-01-25 DIAGNOSIS — I5022 Chronic systolic (congestive) heart failure: Secondary | ICD-10-CM | POA: Diagnosis not present

## 2020-02-08 DIAGNOSIS — R0602 Shortness of breath: Secondary | ICD-10-CM | POA: Diagnosis not present

## 2020-02-17 DIAGNOSIS — B0232 Zoster iridocyclitis: Secondary | ICD-10-CM | POA: Diagnosis not present

## 2020-02-25 DIAGNOSIS — U071 COVID-19: Secondary | ICD-10-CM | POA: Diagnosis not present

## 2020-02-25 DIAGNOSIS — I5022 Chronic systolic (congestive) heart failure: Secondary | ICD-10-CM | POA: Diagnosis not present

## 2020-02-25 DIAGNOSIS — M6281 Muscle weakness (generalized): Secondary | ICD-10-CM | POA: Diagnosis not present

## 2020-02-25 DIAGNOSIS — J449 Chronic obstructive pulmonary disease, unspecified: Secondary | ICD-10-CM | POA: Diagnosis not present

## 2020-03-13 DIAGNOSIS — I10 Essential (primary) hypertension: Secondary | ICD-10-CM | POA: Diagnosis not present

## 2020-03-13 DIAGNOSIS — E782 Mixed hyperlipidemia: Secondary | ICD-10-CM | POA: Diagnosis not present

## 2020-03-18 ENCOUNTER — Emergency Department: Payer: Medicare HMO

## 2020-03-18 ENCOUNTER — Emergency Department
Admission: EM | Admit: 2020-03-18 | Discharge: 2020-03-18 | Disposition: A | Discharge status: Home / Self Care | Payer: Medicare HMO | Attending: Emergency Medicine | Admitting: Emergency Medicine

## 2020-03-18 ENCOUNTER — Other Ambulatory Visit: Payer: Self-pay

## 2020-03-18 DIAGNOSIS — R079 Chest pain, unspecified: Secondary | ICD-10-CM | POA: Diagnosis not present

## 2020-03-18 DIAGNOSIS — Z96651 Presence of right artificial knee joint: Secondary | ICD-10-CM | POA: Diagnosis not present

## 2020-03-18 DIAGNOSIS — Z20822 Contact with and (suspected) exposure to covid-19: Secondary | ICD-10-CM | POA: Insufficient documentation

## 2020-03-18 DIAGNOSIS — I110 Hypertensive heart disease with heart failure: Secondary | ICD-10-CM | POA: Insufficient documentation

## 2020-03-18 DIAGNOSIS — Z87891 Personal history of nicotine dependence: Secondary | ICD-10-CM | POA: Insufficient documentation

## 2020-03-18 DIAGNOSIS — Z79899 Other long term (current) drug therapy: Secondary | ICD-10-CM | POA: Insufficient documentation

## 2020-03-18 DIAGNOSIS — R0602 Shortness of breath: Secondary | ICD-10-CM | POA: Insufficient documentation

## 2020-03-18 DIAGNOSIS — R001 Bradycardia, unspecified: Secondary | ICD-10-CM | POA: Diagnosis not present

## 2020-03-18 DIAGNOSIS — Z7982 Long term (current) use of aspirin: Secondary | ICD-10-CM | POA: Insufficient documentation

## 2020-03-18 DIAGNOSIS — R0902 Hypoxemia: Secondary | ICD-10-CM | POA: Diagnosis not present

## 2020-03-18 DIAGNOSIS — I5022 Chronic systolic (congestive) heart failure: Secondary | ICD-10-CM | POA: Diagnosis not present

## 2020-03-18 DIAGNOSIS — J449 Chronic obstructive pulmonary disease, unspecified: Secondary | ICD-10-CM | POA: Diagnosis not present

## 2020-03-18 LAB — SARS CORONAVIRUS 2 BY RT PCR (HOSPITAL ORDER, PERFORMED IN ~~LOC~~ HOSPITAL LAB): SARS Coronavirus 2: NEGATIVE

## 2020-03-18 LAB — CBC WITH DIFFERENTIAL/PLATELET
Abs Immature Granulocytes: 0.05 10*3/uL (ref 0.00–0.07)
Basophils Absolute: 0.1 10*3/uL (ref 0.0–0.1)
Basophils Relative: 1 %
Eosinophils Absolute: 0.2 10*3/uL (ref 0.0–0.5)
Eosinophils Relative: 4 %
HCT: 41.6 % (ref 39.0–52.0)
Hemoglobin: 14.6 g/dL (ref 13.0–17.0)
Immature Granulocytes: 1 %
Lymphocytes Relative: 13 %
Lymphs Abs: 0.8 10*3/uL (ref 0.7–4.0)
MCH: 34.1 pg — ABNORMAL HIGH (ref 26.0–34.0)
MCHC: 35.1 g/dL (ref 30.0–36.0)
MCV: 97.2 fL (ref 80.0–100.0)
Monocytes Absolute: 0.5 10*3/uL (ref 0.1–1.0)
Monocytes Relative: 8 %
Neutro Abs: 4.6 10*3/uL (ref 1.7–7.7)
Neutrophils Relative %: 73 %
Platelets: 182 10*3/uL (ref 150–400)
RBC: 4.28 MIL/uL (ref 4.22–5.81)
RDW: 13.1 % (ref 11.5–15.5)
WBC: 6.3 10*3/uL (ref 4.0–10.5)
nRBC: 0 % (ref 0.0–0.2)

## 2020-03-18 LAB — COMPREHENSIVE METABOLIC PANEL
ALT: 14 U/L (ref 0–44)
AST: 28 U/L (ref 15–41)
Albumin: 4.1 g/dL (ref 3.5–5.0)
Alkaline Phosphatase: 64 U/L (ref 38–126)
Anion gap: 9 (ref 5–15)
BUN: 10 mg/dL (ref 8–23)
CO2: 25 mmol/L (ref 22–32)
Calcium: 8.4 mg/dL — ABNORMAL LOW (ref 8.9–10.3)
Chloride: 105 mmol/L (ref 98–111)
Creatinine, Ser: 0.75 mg/dL (ref 0.61–1.24)
GFR calc Af Amer: >60 mL/min (ref >60)
GFR calc non Af Amer: >60 mL/min (ref >60)
Glucose, Bld: 99 mg/dL (ref 70–99)
Potassium: 4.1 mmol/L (ref 3.5–5.1)
Sodium: 139 mmol/L (ref 135–145)
Total Bilirubin: 0.8 mg/dL (ref 0.3–1.2)
Total Protein: 6.8 g/dL (ref 6.5–8.1)

## 2020-03-18 LAB — PROCALCITONIN: Procalcitonin: <0.1 ng/mL

## 2020-03-18 LAB — BRAIN NATRIURETIC PEPTIDE: B Natriuretic Peptide: 282 pg/mL — ABNORMAL HIGH (ref 0.0–100.0)

## 2020-03-18 LAB — TROPONIN I (HIGH SENSITIVITY)
Troponin I (High Sensitivity): 21 ng/L — ABNORMAL HIGH (ref <18)
Troponin I (High Sensitivity): 19 ng/L — ABNORMAL HIGH (ref <18)

## 2020-03-18 MED ORDER — IOHEXOL 350 MG/ML SOLN
75.0000 mL | Freq: Once | INTRAVENOUS | Status: AC | PRN
  Administered 2020-03-18: 75 mL via INTRAVENOUS

## 2020-03-18 NOTE — Discharge Instructions (Addendum)
Your heart markers were slightly elevated but they were not changing.  I have low suspicion that this was a heart attack at this time but you can follow-up with cardiology outpatient.  Please call to make an appointment.  If you have return of shortness of breath or chest pain you could return to the ER for repeat evaluation.  Your CT scan as below will need some follow-up outpatient  IMPRESSION:  1. Negative for pulmonary embolism.  2. Lungs are clear.  3. Cardiomegaly.  4. Thoracic aortic aneurysm measuring up to 4.3 cm in diameter at  the aortic arch. Descending thoracic aorta measures 4.0 cm in  diameter. Recommend semi-annual imaging followup by CTA or MRA and  referral to cardiothoracic surgery if not already obtained. This  recommendation follows 2010  ACCF/AHA/AATS/ACR/ASA/SCA/SCAI/SIR/STS/SVM Guidelines for the  Diagnosis and Management of Patients With Thoracic Aortic Disease.  Circulation. 2010; [id number]. Aortic aneurysm NOS (ICD10-I71.9)  5. Aortic and coronary artery atherosclerosis.  (ICD10-I70.0).

## 2020-03-18 NOTE — ED Provider Notes (Signed)
[facility] Emergency Department Provider Note  ____________________________________________   None    (approximate)  I have reviewed the triage vital signs and the nursing notes.   HISTORY  Chief Complaint Shortness of Breath    HPI Darrell Jacobson is a 86 y.o. male with COPD, CHF with EF of 35%, pacemaker due to complete heart block in place who comes in with shortness of breath.  Patient comes in for concern for "heart failure".  When asked patient what he meant by that he stated he just felt like there might be something wrong with his heart.  After again prodding patient he stated that he just feels more short of breath that started last night.  Does not really have any chest pain with it shortness of breath is mild, constant, nothing makes better, nothing makes it worse.  Patient reports taking 2 Ativan and 2 aspirin prior to EMS arrival.  Denies any fevers, coughing.          Past Medical History:  Diagnosis Date  . Arthritis   . Asthma    COPD with inhaler use  . Cancer [facility]) 2013   Basal Cell Skin CA resected from scalp.  . Carotid stenosis    pateint states he has Left carotid blockage worse than Right.   . CHF (congestive heart failure) ([place])   . Collagen vascular disease ([place])   . COPD (chronic obstructive pulmonary disease) ([place])   . Coronary artery disease   . Dyspnea   . GERD (gastroesophageal reflux disease)   . Glaucoma   . History of hiatal hernia   . History of shingles 2012   forehead  . Hypertension   . Multilevel degenerative disc disease   . Neuropathy    legs and feet  . Pacemaker 2006    Patient Active Problem List   Diagnosis Date Noted  . Right-sided chest pain   . Community acquired pneumonia 09/30/2019  . Essential hypertension 09/30/2019  . Chronic systolic CHF (congestive heart failure) ([place]) 09/30/2019  . Complete heart block ([place]) 09/30/2019  . History of cerebrovascular disease 09/30/2019  . PVD  (peripheral vascular disease) ([place]) 09/30/2019  . Chronic obstructive pulmonary disease ([place]) 09/30/2019  . Sepsis with acute renal failure without septic shock ([place])   . Lobar pneumonia ([place])   . Hyperlipidemia   . TIA (transient ischemic attack) 05/03/2019    Past Surgical History:  Procedure Laterality Date  . BACK SURGERY     2 lumbar, 1 thoracic  . CATARACT EXTRACTION W/PHACO Right 12/24/2016   Procedure: CATARACT EXTRACTION PHACO AND INTRAOCULAR LENS PLACEMENT ([place])  Right  complicated;  Surgeon: Leandrew Koyanagi, MD;  Location: [facility];  Service: Ophthalmology;  Laterality: Right;  [place]  . JOINT REPLACEMENT Right    knee  . SKIN DEBRIDEMENT  06/13/2006   facial  . SKIN GRAFT Right 07/11/2016   facial    Prior to Admission medications   Medication Sig Start Date End Date Taking? Authorizing Provider  albuterol (PROAIR HFA) 108 (90 BASE) MCG/ACT inhaler Inhale 2 puffs into the lungs every 6 (six) hours as needed. 01/25/15 09/30/19  [provider]  aspirin EC 81 MG tablet Take 1 tablet by mouth daily.    [provider]  bimatoprost (LUMIGAN) 0.01 % SOLN Apply 1 Dose to eye at bedtime. 01/16/14   [provider]  BREO ELLIPTA 100-25 MCG/INH AEPB Inhale 1 puff into the lungs daily. 09/23/19   [provider]  Cholecalciferol (D 2000) 2000 UNITS TABS Take 1 tablet by mouth daily.    [provider]  doxazosin (CARDURA) 8 MG tablet Take 1 tablet by mouth at bedtime. 06/21/15   [provider]  feeding supplement, ENSURE ENLIVE, (ENSURE ENLIVE) LIQD Take 237 mLs by mouth 2 (two) times daily between meals. 10/02/19   Loletha Grayer, MD  Ferrous Sulfate (SLOW FE PO) Take by mouth.    [provider]  LORazepam (ATIVAN) 0.5 MG tablet Take 1 tablet (0.5 mg total) by mouth 2 (two) times daily as needed for anxiety. Patient taking differently: Take 0.5 mg by mouth 3 (three) times daily.  05/04/19   Hillary Bow, MD  montelukast (SINGULAIR) 10 MG tablet Take 1 tablet by mouth at bedtime. 01/15/15   [provider]  Multiple Vitamin (MULTI-VITAMINS) TABS Take 1 tablet by mouth daily.    [provider]  nebivolol (BYSTOLIC) 2.5 MG tablet Take 1 tablet by mouth 3 (three) times daily. 06/26/15   [provider]  omeprazole (PRILOSEC) 20 MG capsule Take 1 capsule by mouth daily. 03/19/15   [provider]  prednisoLONE acetate (PRED FORTE) 1 % ophthalmic suspension Place 1 drop into the right eye daily. 12/31/18   [provider]  valACYclovir (VALTREX) 500 MG tablet Take 500 mg by mouth daily. 06/23/19   [provider]    Allergies Prednisone, Acyclovir, Albuterol sulfate, Amlodipine, Brinzolamide, Budesonide-formoterol fumarate, Buspirone, Cefdinir, Celecoxib, Chlorthalidone, Clopidogrel, Codeine, Diazepam, Dicyclomine, Duloxetine, Famciclovir, Felodipine, Furosemide, Gabapentin, Hydrochlorothiazide, Indomethacin, Lisinopril, Losartan, Meclizine, Meloxicam, Metaxalone, Metoprolol tartrate, Morphine, Nabumetone, Naproxen, Olmesartan, Oxycodone, Penicillin v potassium, Prasugrel, Promethazine, Sulfa antibiotics, Torsemide, Tramadol, and Zolpidem  History reviewed. No pertinent family history.  Social History Social History   Tobacco Use  . Smoking status: Former Smoker    Quit date: 11/03/1984    Years since quitting: 35.3  . Smokeless tobacco: Never Used  Substance Use Topics  . Alcohol use: Yes    Alcohol/week: 14.0 standard drinks    Types: 14 Cans of beer per week  . Drug use: Not Currently      Review of Systems Constitutional: No fever/chills Eyes: No visual changes. ENT: No sore throat. Cardiovascular: No chest pain Respiratory: Positive for SOB Gastrointestinal: No abdominal pain.  No nausea, no vomiting.  No diarrhea.  No constipation. Genitourinary: Negative for dysuria. Musculoskeletal: Negative for back pain. Skin:  Negative for rash. Neurological: Negative for headaches, focal weakness or numbness. All other ROS negative ____________________________________________   PHYSICAL EXAM:  VITAL SIGNS: ED Triage Vitals  Enc Vitals Group     BP 03/18/20 1125 (!) 142/71     Pulse Rate 03/18/20 1125 65     Resp 03/18/20 1125 18     Temp 03/18/20 1125 98.5 F (36.9 C)     Temp Source 03/18/20 1125 Oral     SpO2 03/18/20 1125 96 %     Weight 03/18/20 1126 170 lb (77.1 kg)     Height 03/18/20 1126 5\' 7"  (1.702 m)     Head Circumference --      Peak Flow --      Pain Score 03/18/20 1126 0     Pain Loc --      Pain Edu? --      Excl. in [place]? --     Constitutional: Alert and oriented. Well appearing and in no acute distress. Eyes: Conjunctivae are normal. EOMI. Head: Atraumatic. Nose: No congestion/rhinnorhea. Mouth/Throat: Mucous membranes are moist.   Neck:  No stridor. Trachea Midline. FROM Cardiovascular: Normal rate, regular rhythm. Grossly normal heart sounds.  Good peripheral circulation.  Palpable pacemaker on his left chest wall without any warmth or erythema Respiratory: No increased work of breathing, clear lungs Gastrointestinal: Soft and nontender. No distention. No abdominal bruits.  Musculoskeletal: No lower extremity tenderness nor edema.  No joint effusions. Neurologic:  Normal speech and language. No gross focal neurologic deficits are appreciated.  Skin:  Skin is warm, dry and intact. No rash noted. Psychiatric: Mood and affect are normal. Speech and behavior are normal. GU: Deferred   ____________________________________________   LABS (all labs ordered are listed, but only abnormal results are displayed)  Labs Reviewed  CBC WITH DIFFERENTIAL/PLATELET - Abnormal; Notable for the following components:      Result Value   MCH 34.1 (*)    All other components within normal limits  COMPREHENSIVE METABOLIC PANEL - Abnormal; Notable for the following components:   Calcium 8.4  (*)    All other components within normal limits  BRAIN NATRIURETIC PEPTIDE - Abnormal; Notable for the following components:   B Natriuretic Peptide 282.0 (*)    All other components within normal limits  TROPONIN I (HIGH SENSITIVITY) - Abnormal; Notable for the following components:   Troponin I (High Sensitivity) 21 (*)    All other components within normal limits  TROPONIN I (HIGH SENSITIVITY) - Abnormal; Notable for the following components:   Troponin I (High Sensitivity) 19 (*)    All other components within normal limits  SARS CORONAVIRUS 2 BY RT PCR (HOSPITAL ORDER, [facility])  PROCALCITONIN   ____________________________________________   ED ECG REPORT I, Vanessa Rosston, the attending physician, personally viewed and interpreted this ECG.  EKG is sinus rate of 68, bundle-branch block without evidence of scar Bosa criteria and similar T wave inversion in aVL compared to prior EKG.  Widened QRS.  No evidence of STEMI ____________________________________________  RADIOLOGY Pilot Bellow, personally viewed and evaluated these images (plain radiographs) as part of my medical decision making, as well as reviewing the written report by the radiologist.  ED MD interpretation: Chest x-ray no pneumonia  Official radiology report(s): CT Angio Chest PE W and/or Wo Contrast  Result Date: 03/18/2020 CLINICAL DATA:  Shortness of breath EXAM: CT ANGIOGRAPHY CHEST WITH CONTRAST TECHNIQUE: Multidetector CT imaging of the chest was performed using the standard protocol during bolus administration of intravenous contrast. Multiplanar CT image reconstructions and MIPs were obtained to evaluate the vascular anatomy. CONTRAST:  38mL OMNIPAQUE IOHEXOL 350 MG/ML SOLN COMPARISON:  CT 05/02/2013, same-day x-ray FINDINGS: Cardiovascular: Left-sided implanted cardiac device. Cardiomegaly. No pericardial effusion. Pulmonary vasculature is well opacified. No filling defect to  the segmental branch level to suggest pulmonary embolism. Main pulmonary trunk is normal in caliber. Thoracic aortic arch measures up to 4.3 cm in diameter just distal to the left subclavian artery origin (series 9, image 57). Descending thoracic aorta measures 4.0 cm in diameter (series 9, image 77). Extensive thoracic aortic atherosclerosis. Three-vessel coronary artery atherosclerosis. Mediastinum/Nodes: No axillary, mediastinal, or hilar lymphadenopathy. Trachea, thyroid, and esophagus within normal limits. Lungs/Pleura: Lungs are clear. No pleural effusion or pneumothorax. Upper Abdomen: No acute abnormality. Musculoskeletal: Anterior degenerative endplate spurring within the thoracic spine. No acute osseous abnormality. No focal chest wall lesion. Review of the MIP images confirms the above findings. IMPRESSION: 1. Negative for pulmonary embolism. 2. Lungs are clear. 3. Cardiomegaly. 4. Thoracic aortic aneurysm measuring up to 4.3 cm  in diameter at the aortic arch. Descending thoracic aorta measures 4.0 cm in diameter. Recommend semi-annual imaging followup by CTA or MRA and referral to cardiothoracic surgery if not already obtained. This recommendation follows 2010 ACCF/AHA/AATS/ACR/ASA/SCA/SCAI/SIR/STS/SVM Guidelines for the Diagnosis and Management of Patients With Thoracic Aortic Disease. Circulation. 2010; [id number]. Aortic aneurysm NOS (ICD10-I71.9) 5. Aortic and coronary artery atherosclerosis.  (ICD10-I70.0). Electronically Signed   By: Davina Poke D.O.   On: 03/18/2020 13:43   DG Chest Portable 1 View  Result Date: 03/18/2020 CLINICAL DATA:  Pt arrives via [facility] from home for c/o "heart failure". Pt reports shob that started last night, denies chest pain. Pt speaking in complete sentences without shob. Hx - asthma, CHF, COPD, CAD, HTN, pacemaker, basal cell skin cancer, former smoker quit 1986. EXAM: PORTABLE CHEST 1 VIEW COMPARISON:  09/30/2019 FINDINGS: Mild enlargement of the  cardiopericardial silhouette. Stable left anterior chest wall sequential pacemaker. No mediastinal or hilar masses. No convincing adenopathy. Lungs are hyperexpanded but clear. No pleural effusion or pneumothorax. Skeletal structures are grossly intact. IMPRESSION: 1. No acute cardiopulmonary disease. 2. Mild cardiomegaly. Electronically Signed   By: Lajean Manes M.D.   On: 03/18/2020 11:53    ____________________________________________   PROCEDURES  Procedure(s) performed (including Critical Care):  Procedures   ____________________________________________   INITIAL IMPRESSION / ASSESSMENT AND PLAN / ED COURSE   Darrell Jacobson was evaluated in Emergency Department on 03/18/2020 for the symptoms described in the history of present illness. He was evaluated in the context of the global COVID-19 pandemic, which necessitated consideration that the patient might be at risk for infection with the SARS-CoV-2 virus that causes COVID-19. Institutional protocols and algorithms that pertain to the evaluation of patients at risk for COVID-19 are in a state of rapid change based on information released by regulatory bodies including the CDC and federal and state organizations. These policies and algorithms were followed during the patient's care in the ED.     Pt presents with concerns for his heart?  Possibly some shortness of breath. PNA-will get xray to evaluation Anemia-CBC to evaluate ACS- will get trops Arrhythmia-Will get EKG and keep on monitor.  COVID- will get testing per algorithm. PE-consider if x-ray is negative.  EKG looks similar to prior with his left bundle branch block, chest x-ray negative.  Patient is satting normally on room air and does not appear to be in any respiratory distress but given the concern for may be some shortness of breath and not being the best historian I think it be reasonable to get a CT PE to make sure not missing anything.  CT PE was negative.  Did show  a incidental finding of an aneurysm which I did discuss with patient and the son.  And provided a copy of the report for the records.  On reevaluation of patient he states that he is not having any shortness of breath and he feels at his normal self.  He is remained satting above 95% in the emergency room and continued to have looked well from his 4 hours.    I called patient's son to further discuss and he stated that since patient was diagnosed with a stroke a few months ago he has had many things that he has been "complaining about recently".  He states that he is not super concerned about the shortness of breath but his caregiver never came today and he told the son that he wanted to make sure that his heart looked  good today.  I discussed that his troponin was slightly elevated but on upon repeat it was stable and declining.  Patient had no chest pain and I discussed with son that we could admit patient for continued trop trending and discussed with cardiology but given his age and given no chest pain I doubt that any acute interventions would be had versus patient going home and following up with cardiology outpatient and returning to the ER if he if he is developing worsening chest pain or shortness of breath.  Patient's son  said would prefer the latter.  I discussed the provisional nature of ED diagnosis, the treatment so far, the ongoing plan of care, follow up appointments and return precautions with the patient and any family or support people present. They expressed understanding and agreed with the plan, discharged home.    ____________________________________________   FINAL CLINICAL IMPRESSION(S) / ED DIAGNOSES   Final diagnoses:  SOB (shortness of breath)     MEDICATIONS GIVEN DURING THIS VISIT:  Medications  iohexol (OMNIPAQUE) 350 MG/ML injection 75 mL (75 mLs Intravenous Contrast Given 03/18/20 1310)     ED Discharge Orders    None       Note:  This document was  prepared using Dragon voice recognition software and may include unintentional dictation errors.   Vanessa Harmony, MD 03/19/20 1536

## 2020-03-18 NOTE — ED Triage Notes (Addendum)
Pt arrives via [facility] from home for c/o "heart failure". Pt reports shob that started last night, denies chest pain. Pt speaking in complete sentences without shob. A&Ox4 and in NAD. Pt took two 1mg  Ativan and two 81 mg ASA before EMS arrival

## 2020-03-20 DIAGNOSIS — J432 Centrilobular emphysema: Secondary | ICD-10-CM | POA: Diagnosis not present

## 2020-03-20 DIAGNOSIS — E782 Mixed hyperlipidemia: Secondary | ICD-10-CM | POA: Diagnosis not present

## 2020-03-20 DIAGNOSIS — I2510 Atherosclerotic heart disease of native coronary artery without angina pectoris: Secondary | ICD-10-CM | POA: Diagnosis not present

## 2020-03-20 DIAGNOSIS — Z87891 Personal history of nicotine dependence: Secondary | ICD-10-CM | POA: Diagnosis not present

## 2020-03-20 DIAGNOSIS — N390 Urinary tract infection, site not specified: Secondary | ICD-10-CM | POA: Diagnosis not present

## 2020-03-20 DIAGNOSIS — Z0001 Encounter for general adult medical examination with abnormal findings: Secondary | ICD-10-CM | POA: Diagnosis not present

## 2020-03-20 DIAGNOSIS — I442 Atrioventricular block, complete: Secondary | ICD-10-CM | POA: Diagnosis not present

## 2020-03-21 DIAGNOSIS — I6523 Occlusion and stenosis of bilateral carotid arteries: Secondary | ICD-10-CM | POA: Diagnosis not present

## 2020-03-21 DIAGNOSIS — I25118 Atherosclerotic heart disease of native coronary artery with other forms of angina pectoris: Secondary | ICD-10-CM | POA: Diagnosis not present

## 2020-03-21 DIAGNOSIS — I5022 Chronic systolic (congestive) heart failure: Secondary | ICD-10-CM | POA: Diagnosis not present

## 2020-03-21 DIAGNOSIS — I442 Atrioventricular block, complete: Secondary | ICD-10-CM | POA: Diagnosis not present

## 2020-03-21 DIAGNOSIS — E782 Mixed hyperlipidemia: Secondary | ICD-10-CM | POA: Diagnosis not present

## 2020-03-21 DIAGNOSIS — I10 Essential (primary) hypertension: Secondary | ICD-10-CM | POA: Diagnosis not present

## 2020-03-30 DIAGNOSIS — R42 Dizziness and giddiness: Secondary | ICD-10-CM | POA: Diagnosis not present

## 2020-03-30 DIAGNOSIS — I69393 Ataxia following cerebral infarction: Secondary | ICD-10-CM | POA: Diagnosis not present

## 2020-03-30 DIAGNOSIS — Z8673 Personal history of transient ischemic attack (TIA), and cerebral infarction without residual deficits: Secondary | ICD-10-CM | POA: Diagnosis not present

## 2020-03-30 DIAGNOSIS — I6523 Occlusion and stenosis of bilateral carotid arteries: Secondary | ICD-10-CM | POA: Diagnosis not present

## 2020-04-10 DIAGNOSIS — H401132 Primary open-angle glaucoma, bilateral, moderate stage: Secondary | ICD-10-CM | POA: Diagnosis not present

## 2020-04-30 DIAGNOSIS — E782 Mixed hyperlipidemia: Secondary | ICD-10-CM | POA: Diagnosis not present

## 2020-04-30 DIAGNOSIS — I25118 Atherosclerotic heart disease of native coronary artery with other forms of angina pectoris: Secondary | ICD-10-CM | POA: Diagnosis not present

## 2020-04-30 DIAGNOSIS — I10 Essential (primary) hypertension: Secondary | ICD-10-CM | POA: Diagnosis not present

## 2020-04-30 DIAGNOSIS — I6523 Occlusion and stenosis of bilateral carotid arteries: Secondary | ICD-10-CM | POA: Diagnosis not present

## 2020-04-30 DIAGNOSIS — I69393 Ataxia following cerebral infarction: Secondary | ICD-10-CM | POA: Diagnosis not present

## 2020-04-30 DIAGNOSIS — I5022 Chronic systolic (congestive) heart failure: Secondary | ICD-10-CM | POA: Diagnosis not present

## 2020-04-30 DIAGNOSIS — I442 Atrioventricular block, complete: Secondary | ICD-10-CM | POA: Diagnosis not present

## 2020-04-30 DIAGNOSIS — R531 Weakness: Secondary | ICD-10-CM | POA: Diagnosis not present

## 2020-05-14 ENCOUNTER — Other Ambulatory Visit: Payer: Self-pay

## 2020-05-14 ENCOUNTER — Encounter: Payer: Medicare HMO | Attending: Internal Medicine

## 2020-05-14 DIAGNOSIS — I5020 Unspecified systolic (congestive) heart failure: Secondary | ICD-10-CM | POA: Insufficient documentation

## 2020-05-14 DIAGNOSIS — J449 Chronic obstructive pulmonary disease, unspecified: Secondary | ICD-10-CM | POA: Insufficient documentation

## 2020-05-14 DIAGNOSIS — I2510 Atherosclerotic heart disease of native coronary artery without angina pectoris: Secondary | ICD-10-CM | POA: Insufficient documentation

## 2020-05-14 DIAGNOSIS — Z87891 Personal history of nicotine dependence: Secondary | ICD-10-CM | POA: Insufficient documentation

## 2020-05-14 DIAGNOSIS — Z7982 Long term (current) use of aspirin: Secondary | ICD-10-CM | POA: Insufficient documentation

## 2020-05-14 DIAGNOSIS — H409 Unspecified glaucoma: Secondary | ICD-10-CM | POA: Insufficient documentation

## 2020-05-14 DIAGNOSIS — I110 Hypertensive heart disease with heart failure: Secondary | ICD-10-CM | POA: Insufficient documentation

## 2020-05-14 DIAGNOSIS — K219 Gastro-esophageal reflux disease without esophagitis: Secondary | ICD-10-CM | POA: Insufficient documentation

## 2020-05-14 DIAGNOSIS — I5022 Chronic systolic (congestive) heart failure: Secondary | ICD-10-CM

## 2020-05-14 DIAGNOSIS — Z79899 Other long term (current) drug therapy: Secondary | ICD-10-CM | POA: Insufficient documentation

## 2020-05-14 DIAGNOSIS — Z950 Presence of cardiac pacemaker: Secondary | ICD-10-CM | POA: Insufficient documentation

## 2020-05-14 DIAGNOSIS — G629 Polyneuropathy, unspecified: Secondary | ICD-10-CM | POA: Insufficient documentation

## 2020-05-14 NOTE — Progress Notes (Signed)
Virtual Visit completed. Patient informed on EP and RD appointment and 6 Minute walk test. Patient also informed of patient health questionnaires on My Chart. Patient Verbalizes understanding. Visit diagnosis can be found in [facility] 04/30/2020.

## 2020-05-23 ENCOUNTER — Other Ambulatory Visit: Payer: Self-pay

## 2020-05-23 ENCOUNTER — Encounter: Payer: Medicare HMO | Admitting: *Deleted

## 2020-05-23 VITALS — Ht 66.0 in | Wt 180.0 lb

## 2020-05-23 DIAGNOSIS — I5022 Chronic systolic (congestive) heart failure: Secondary | ICD-10-CM

## 2020-05-23 DIAGNOSIS — I2510 Atherosclerotic heart disease of native coronary artery without angina pectoris: Secondary | ICD-10-CM | POA: Diagnosis not present

## 2020-05-23 DIAGNOSIS — I110 Hypertensive heart disease with heart failure: Secondary | ICD-10-CM | POA: Diagnosis not present

## 2020-05-23 DIAGNOSIS — H409 Unspecified glaucoma: Secondary | ICD-10-CM | POA: Diagnosis not present

## 2020-05-23 DIAGNOSIS — Z87891 Personal history of nicotine dependence: Secondary | ICD-10-CM | POA: Diagnosis not present

## 2020-05-23 DIAGNOSIS — J449 Chronic obstructive pulmonary disease, unspecified: Secondary | ICD-10-CM | POA: Diagnosis not present

## 2020-05-23 DIAGNOSIS — K219 Gastro-esophageal reflux disease without esophagitis: Secondary | ICD-10-CM | POA: Diagnosis not present

## 2020-05-23 DIAGNOSIS — Z950 Presence of cardiac pacemaker: Secondary | ICD-10-CM | POA: Diagnosis not present

## 2020-05-23 DIAGNOSIS — G629 Polyneuropathy, unspecified: Secondary | ICD-10-CM | POA: Diagnosis not present

## 2020-05-23 DIAGNOSIS — Z7982 Long term (current) use of aspirin: Secondary | ICD-10-CM | POA: Diagnosis not present

## 2020-05-23 DIAGNOSIS — I5020 Unspecified systolic (congestive) heart failure: Secondary | ICD-10-CM | POA: Diagnosis not present

## 2020-05-23 DIAGNOSIS — Z79899 Other long term (current) drug therapy: Secondary | ICD-10-CM | POA: Diagnosis not present

## 2020-05-23 NOTE — Patient Instructions (Signed)
Patient Instructions  Patient Details  Name: Darrell Jacobson MRN: [id number] Date of Birth: 1928/01/27 Referring Provider:  Corey Skains, MD  Below are your personal goals for exercise, nutrition, and risk factors. Our goal is to help you stay on track towards obtaining and maintaining these goals. We will be discussing your progress on these goals with you throughout the program.  Initial Exercise Prescription:  Initial Exercise Prescription - 05/23/20 1000      Date of Initial Exercise RX and Referring Provider   Date 05/23/20    Referring Provider Serafina Royals MD      NuStep   Level 1    SPM 80    Minutes 30    METs 1.5      Biostep-RELP   Level 1    SPM 50    Minutes 15    METs 1      Prescription Details   Frequency (times per week) 2    Duration Progress to 30 minutes of continuous aerobic without signs/symptoms of physical distress      Intensity   THRR 40-80% of Max Heartrate 93-118    Ratings of Perceived Exertion 11-13    Perceived Dyspnea 0-4      Progression   Progression Continue to progress workloads to maintain intensity without signs/symptoms of physical distress.      Resistance Training   Training Prescription Yes    Weight 3 lb    Reps 10-15           Exercise Goals: Frequency: Be able to perform aerobic exercise two to three times per week in program working toward 2-5 days per week of home exercise.  Intensity: Work with a perceived exertion of 11 (fairly light) - 15 (hard) while following your exercise prescription.  We will make changes to your prescription with you as you progress through the program.   Duration: Be able to do 30 to 45 minutes of continuous aerobic exercise in addition to a 5 minute warm-up and a 5 minute cool-down routine.   Nutrition Goals: Your personal nutrition goals will be established when you do your nutrition analysis with the dietician.  The following are general nutrition guidelines to  follow: Cholesterol < 200mg /day Sodium < 1500mg /day Fiber: Men over 50 yrs - 30 grams per day  Personal Goals:  Personal Goals and Risk Factors at Admission - 05/23/20 1116      Core Components/Risk Factors/Patient Goals on Admission    Weight Management Yes;Weight Loss    Intervention Weight Management: Develop a combined nutrition and exercise program designed to reach desired caloric intake, while maintaining appropriate intake of nutrient and fiber, sodium and fats, and appropriate energy expenditure required for the weight goal.;Weight Management: Provide education and appropriate resources to help participant work on and attain dietary goals.    Admit Weight 180 lb (81.6 kg)    Goal Weight: Short Term 175 lb (79.4 kg)    Goal Weight: Long Term 170 lb (77.1 kg)    Expected Outcomes Short Term: Continue to assess and modify interventions until short term weight is achieved;Long Term: Adherence to nutrition and physical activity/exercise program aimed toward attainment of established weight goal;Weight Loss: Understanding of general recommendations for a balanced deficit meal plan, which promotes 1-2 lb weight loss per week and includes a negative energy balance of [phone] kcal/d;Understanding recommendations for meals to include 15-35% energy as protein, 25-35% energy from fat, 35-60% energy from carbohydrates, less than 200mg  of dietary cholesterol, 20-35  gm of total fiber daily;Understanding of distribution of calorie intake throughout the day with the consumption of 4-5 meals/snacks    Heart Failure Yes    Intervention Provide a combined exercise and nutrition program that is supplemented with education, support and counseling about heart failure. Directed toward relieving symptoms such as shortness of breath, decreased exercise tolerance, and extremity edema.    Expected Outcomes Improve functional capacity of life;Short term: Attendance in program 2-3 days a week with increased exercise  capacity. Reported lower sodium intake. Reported increased fruit and vegetable intake. Reports medication compliance.;Short term: Daily weights obtained and reported for increase. Utilizing diuretic protocols set by physician.;Long term: Adoption of self-care skills and reduction of barriers for early signs and symptoms recognition and intervention leading to self-care maintenance.    Hypertension Yes    Intervention Provide education on lifestyle modifcations including regular physical activity/exercise, weight management, moderate sodium restriction and increased consumption of fresh fruit, vegetables, and low fat dairy, alcohol moderation, and smoking cessation.;Monitor prescription use compliance.    Expected Outcomes Short Term: Continued assessment and intervention until BP is < 140/1mm HG in hypertensive participants. < 130/46mm HG in hypertensive participants with diabetes, heart failure or chronic kidney disease.;Long Term: Maintenance of blood pressure at goal levels.    Lipids Yes    Intervention Provide education and support for participant on nutrition & aerobic/resistive exercise along with prescribed medications to achieve LDL 70mg , HDL >40mg .    Expected Outcomes Short Term: Participant states understanding of desired cholesterol values and is compliant with medications prescribed. Participant is following exercise prescription and nutrition guidelines.;Long Term: Cholesterol controlled with medications as prescribed, with individualized exercise RX and with personalized nutrition plan. Value goals: LDL < 70mg , HDL > 40 mg.           Tobacco Use Initial Evaluation: Social History   Tobacco Use  Smoking Status Former Smoker   Packs/day: 1.50   Years: 38.00   Pack years: 57.00   Types: Cigarettes   Quit date: 11/03/1984   Years since quitting: 35.5  Smokeless Tobacco Never Used    Exercise Goals and Review:  Exercise Goals    Row Name 05/23/20 1113              Exercise Goals   Increase Physical Activity Yes       Intervention Provide advice, education, support and counseling about physical activity/exercise needs.;Develop an individualized exercise prescription for aerobic and resistive training based on initial evaluation findings, risk stratification, comorbidities and participant's personal goals.       Expected Outcomes Short Term: Attend rehab on a regular basis to increase amount of physical activity.;Long Term: Add in home exercise to make exercise part of routine and to increase amount of physical activity.;Long Term: Exercising regularly at least 3-5 days a week.       Increase Strength and Stamina Yes       Intervention Provide advice, education, support and counseling about physical activity/exercise needs.;Develop an individualized exercise prescription for aerobic and resistive training based on initial evaluation findings, risk stratification, comorbidities and participant's personal goals.       Expected Outcomes Short Term: Increase workloads from initial exercise prescription for resistance, speed, and METs.;Short Term: Perform resistance training exercises routinely during rehab and add in resistance training at home;Long Term: Improve cardiorespiratory fitness, muscular endurance and strength as measured by increased METs and functional capacity (6MWT)       Able to understand and use rate of perceived exertion (  RPE) scale Yes       Intervention Provide education and explanation on how to use RPE scale       Expected Outcomes Short Term: Able to use RPE daily in rehab to express subjective intensity level;Long Term:  Able to use RPE to guide intensity level when exercising independently       Able to understand and use Dyspnea scale Yes       Intervention Provide education and explanation on how to use Dyspnea scale       Expected Outcomes Short Term: Able to use Dyspnea scale daily in rehab to express subjective sense of shortness of breath  during exertion;Long Term: Able to use Dyspnea scale to guide intensity level when exercising independently       Knowledge and understanding of Target Heart Rate Range (THRR) Yes       Intervention Provide education and explanation of THRR including how the numbers were predicted and where they are located for reference       Expected Outcomes Short Term: Able to state/look up THRR;Short Term: Able to use daily as guideline for intensity in rehab;Long Term: Able to use THRR to govern intensity when exercising independently       Able to check pulse independently Yes       Intervention Provide education and demonstration on how to check pulse in carotid and radial arteries.;Review the importance of being able to check your own pulse for safety during independent exercise       Expected Outcomes Short Term: Able to explain why pulse checking is important during independent exercise;Long Term: Able to check pulse independently and accurately       Understanding of Exercise Prescription Yes       Intervention Provide education, explanation, and written materials on patient's individual exercise prescription       Expected Outcomes Short Term: Able to explain program exercise prescription;Long Term: Able to explain home exercise prescription to exercise independently              Copy of goals given to participant.

## 2020-05-23 NOTE — Progress Notes (Signed)
Cardiac Individual Treatment Plan  Patient Details  Name: Darrell Jacobson MRN: [id number] Date of Birth: [birth year] Referring Provider:     Cardiac Rehab from 05/23/2020 in [facility] Cardiac and Pulmonary Rehab  Referring Provider Serafina Royals MD      Initial Encounter Date:    Cardiac Rehab from 05/23/2020 in [facility] Cardiac and Pulmonary Rehab  Date 05/23/20      Visit Diagnosis: Heart failure, chronic systolic ([place])  Patient's Home Medications on Admission:  Current Outpatient Medications:  .  albuterol (PROAIR HFA) 108 (90 BASE) MCG/ACT inhaler, Inhale 2 puffs into the lungs every 6 (six) hours as needed., Disp: , Rfl:  .  aspirin EC 81 MG tablet, Take 1 tablet by mouth daily., Disp: , Rfl:  .  bimatoprost (LUMIGAN) 0.01 % SOLN, Apply 1 Dose to eye at bedtime., Disp: , Rfl:  .  BREO ELLIPTA 100-25 MCG/INH AEPB, Inhale 1 puff into the lungs daily., Disp: , Rfl:  .  Cholecalciferol (D 2000) 2000 UNITS TABS, Take 1 tablet by mouth daily., Disp: , Rfl:  .  doxazosin (CARDURA) 8 MG tablet, Take 1 tablet by mouth at bedtime., Disp: , Rfl:  .  feeding supplement, ENSURE ENLIVE, (ENSURE ENLIVE) LIQD, Take 237 mLs by mouth 2 (two) times daily between meals., Disp: 14220 mL, Rfl: 0 .  Ferrous Sulfate (SLOW FE PO), Take by mouth., Disp: , Rfl:  .  LORazepam (ATIVAN) 0.5 MG tablet, Take 1 tablet (0.5 mg total) by mouth 2 (two) times daily as needed for anxiety. (Patient taking differently: Take 0.5 mg by mouth 3 (three) times daily. ), Disp: 10 tablet, Rfl: 0 .  montelukast (SINGULAIR) 10 MG tablet, Take 1 tablet by mouth at bedtime., Disp: , Rfl:  .  Multiple Vitamin (MULTI-VITAMINS) TABS, Take 1 tablet by mouth daily., Disp: , Rfl:  .  nebivolol (BYSTOLIC) 2.5 MG tablet, Take 1 tablet by mouth 3 (three) times daily., Disp: , Rfl:  .  omeprazole (PRILOSEC) 20 MG capsule, Take 1 capsule by mouth daily., Disp: , Rfl:  .  prednisoLONE acetate (PRED FORTE) 1 % ophthalmic suspension, Place 1 drop  into the right eye daily., Disp: , Rfl:  .  valACYclovir (VALTREX) 500 MG tablet, Take 500 mg by mouth daily., Disp: , Rfl:   Past Medical History: Past Medical History:  Diagnosis Date  . Arthritis   . Asthma    COPD with inhaler use  . Cancer [facility]) 2013   Basal Cell Skin CA resected from scalp.  . Carotid stenosis    pateint states he has Left carotid blockage worse than Right.   . CHF (congestive heart failure) ([place])   . Collagen vascular disease ([place])   . COPD (chronic obstructive pulmonary disease) ([place])   . Coronary artery disease   . Dyspnea   . GERD (gastroesophageal reflux disease)   . Glaucoma   . History of hiatal hernia   . History of shingles 2012   forehead  . Hypertension   . Multilevel degenerative disc disease   . Neuropathy    legs and feet  . Pacemaker 2006    Tobacco Use: Social History   Tobacco Use  Smoking Status Former Smoker  . Packs/day: 1.50  . Years: 38.00  . Pack years: 57.00  . Types: Cigarettes  . Quit date: 11/03/1984  . Years since quitting: 35.5  Smokeless Tobacco Never Used    Labs: Recent Review [facility] Cardiac and Pulmonary  Rehab Latest Ref Rng & Units 03/16/2014 05/03/2019   Cholestrol 0 - 200 mg/dL 180 150   LDLCALC 0 - 99 mg/dL 87 73   HDL >40 mg/dL 59 69   Trlycerides <150 mg/dL 169 42   Hemoglobin A1c 4.8 - 5.6 % - 5.2       Exercise Target Goals: Exercise Program Goal: Individual exercise prescription set using results from initial 6 min walk test and THRR while considering  patient's activity barriers and safety.   Exercise Prescription Goal: Initial exercise prescription builds to 30-45 minutes a day of aerobic activity, 2-3 days per week.  Home exercise guidelines will be given to patient during program as part of exercise prescription that the participant will acknowledge.   Education: Aerobic Exercise & Resistance Training: - Gives group verbal and written instruction on the various  components of exercise. Focuses on aerobic and resistive training programs and the benefits of this training and how to safely progress through these programs..   Education: Exercise & Equipment Safety: - Individual verbal instruction and demonstration of equipment use and safety with use of the equipment.   Cardiac Rehab from 05/23/2020 in [facility] Cardiac and Pulmonary Rehab  Date 05/14/20  Educator [facility]  Instruction Review Code 1- Verbalizes Understanding      Education: Exercise Physiology & General Exercise Guidelines: - Group verbal and written instruction with models to review the exercise physiology of the cardiovascular system and associated critical values. Provides general exercise guidelines with specific guidelines to those with heart or lung disease.    Education: Flexibility, Balance, Mind/Body Relaxation: Provides group verbal/written instruction on the benefits of flexibility and balance training, including mind/body exercise modes such as yoga, pilates and tai chi.  Demonstration and skill practice provided.   Activity Barriers & Risk Stratification:  Activity Barriers & Cardiac Risk Stratification - 05/23/20 1058      Activity Barriers & Cardiac Risk Stratification   Activity Barriers Arthritis;Right Knee Replacement;Joint Problems;Deconditioning;Muscular Weakness;History of Falls;Assistive Device;Balance Concerns;Other (comment)    Comments unsteady, uses wheelchair at home, better using walker, arth all over especially left knee, r leg weak from stroke and gives out, constant dizziness, blurry vision    Cardiac Risk Stratification High           6 Minute Walk:  6 Minute Walk    Row Name 05/23/20 1051         6 Minute Walk   Phase Initial  using walker with chair behind     Distance 145 feet     Walk Time 5.5 minutes     # of Rest Breaks 1  stopped with 30 sec left     MPH 0.3     RPE 13     Symptoms Yes (comment)     Comments R leg weak, knee give out,  dizziness, vision blurry     Resting HR 68 bpm     Resting BP 138/74     Resting Oxygen Saturation  95 %     Exercise Oxygen Saturation  during 6 min walk 96 %     Max Ex. HR 83 bpm     Max Ex. BP 164/84     2 Minute Post BP 146/62            Oxygen Initial Assessment:   Oxygen Re-Evaluation:   Oxygen Discharge (Final Oxygen Re-Evaluation):   Initial Exercise Prescription:  Initial Exercise Prescription - 05/23/20 1000      Date of Initial Exercise  RX and Referring Provider   Date 05/23/20    Referring Provider Serafina Royals MD      NuStep   Level 1    SPM 80    Minutes 30    METs 1.5      Biostep-RELP   Level 1    SPM 50    Minutes 15    METs 1      Prescription Details   Frequency (times per week) 2    Duration Progress to 30 minutes of continuous aerobic without signs/symptoms of physical distress      Intensity   THRR 40-80% of Max Heartrate 93-118    Ratings of Perceived Exertion 11-13    Perceived Dyspnea 0-4      Progression   Progression Continue to progress workloads to maintain intensity without signs/symptoms of physical distress.      Resistance Training   Training Prescription Yes    Weight 3 lb    Reps 10-15           Perform Capillary Blood Glucose checks as needed.  Exercise Prescription Changes:  Exercise Prescription Changes    Row Name 05/23/20 1000             Response to Exercise   Blood Pressure (Admit) 138/74       Blood Pressure (Exercise) 164/84       Blood Pressure (Exit) 146/62       Heart Rate (Admit) 68 bpm       Heart Rate (Exercise) 83 bpm       Heart Rate (Exit) 72 bpm       Oxygen Saturation (Admit) 95 %       Oxygen Saturation (Exercise) 96 %       Rating of Perceived Exertion (Exercise) 13       Symptoms leg weakness, knee giving way, dizziness, blurry vision       Comments walk test results              Exercise Comments:   Exercise Goals and Review:  Exercise Goals    Row Name 05/23/20  1113             Exercise Goals   Increase Physical Activity Yes       Intervention Provide advice, education, support and counseling about physical activity/exercise needs.;Develop an individualized exercise prescription for aerobic and resistive training based on initial evaluation findings, risk stratification, comorbidities and participant's personal goals.       Expected Outcomes Short Term: Attend rehab on a regular basis to increase amount of physical activity.;Long Term: Add in home exercise to make exercise part of routine and to increase amount of physical activity.;Long Term: Exercising regularly at least 3-5 days a week.       Increase Strength and Stamina Yes       Intervention Provide advice, education, support and counseling about physical activity/exercise needs.;Develop an individualized exercise prescription for aerobic and resistive training based on initial evaluation findings, risk stratification, comorbidities and participant's personal goals.       Expected Outcomes Short Term: Increase workloads from initial exercise prescription for resistance, speed, and METs.;Short Term: Perform resistance training exercises routinely during rehab and add in resistance training at home;Long Term: Improve cardiorespiratory fitness, muscular endurance and strength as measured by increased METs and functional capacity (6MWT)       Able to understand and use rate of perceived exertion (RPE) scale Yes       Intervention Provide  education and explanation on how to use RPE scale       Expected Outcomes Short Term: Able to use RPE daily in rehab to express subjective intensity level;Long Term:  Able to use RPE to guide intensity level when exercising independently       Able to understand and use Dyspnea scale Yes       Intervention Provide education and explanation on how to use Dyspnea scale       Expected Outcomes Short Term: Able to use Dyspnea scale daily in rehab to express subjective sense  of shortness of breath during exertion;Long Term: Able to use Dyspnea scale to guide intensity level when exercising independently       Knowledge and understanding of Target Heart Rate Range (THRR) Yes       Intervention Provide education and explanation of THRR including how the numbers were predicted and where they are located for reference       Expected Outcomes Short Term: Able to state/look up THRR;Short Term: Able to use daily as guideline for intensity in rehab;Long Term: Able to use THRR to govern intensity when exercising independently       Able to check pulse independently Yes       Intervention Provide education and demonstration on how to check pulse in carotid and radial arteries.;Review the importance of being able to check your own pulse for safety during independent exercise       Expected Outcomes Short Term: Able to explain why pulse checking is important during independent exercise;Long Term: Able to check pulse independently and accurately       Understanding of Exercise Prescription Yes       Intervention Provide education, explanation, and written materials on patient's individual exercise prescription       Expected Outcomes Short Term: Able to explain program exercise prescription;Long Term: Able to explain home exercise prescription to exercise independently              Exercise Goals Re-Evaluation :   Discharge Exercise Prescription (Final Exercise Prescription Changes):  Exercise Prescription Changes - 05/23/20 1000      Response to Exercise   Blood Pressure (Admit) 138/74    Blood Pressure (Exercise) 164/84    Blood Pressure (Exit) 146/62    Heart Rate (Admit) 68 bpm    Heart Rate (Exercise) 83 bpm    Heart Rate (Exit) 72 bpm    Oxygen Saturation (Admit) 95 %    Oxygen Saturation (Exercise) 96 %    Rating of Perceived Exertion (Exercise) 13    Symptoms leg weakness, knee giving way, dizziness, blurry vision    Comments walk test results            Nutrition:  Target Goals: Understanding of nutrition guidelines, daily intake of sodium '1500mg'$ , cholesterol '200mg'$ , calories 30% from fat and 7% or less from saturated fats, daily to have 5 or more servings of fruits and vegetables.  Education: Controlling Sodium/Reading Food Labels -Group verbal and written material supporting the discussion of sodium use in heart healthy nutrition. Review and explanation with models, verbal and written materials for utilization of the food label.   Education: General Nutrition Guidelines/Fats and Fiber: -Group instruction provided by verbal, written material, models and posters to present the general guidelines for heart healthy nutrition. Gives an explanation and review of dietary fats and fiber.   Biometrics:  Pre Biometrics - 05/23/20 1114      Pre Biometrics   Height '5\' 6"'$  (1.676  m)   unable to stand unsupported   Weight 180 lb (81.6 kg)    BMI (Calculated) 29.07            Nutrition Therapy Plan and Nutrition Goals:   Nutrition Assessments:  Nutrition Assessments - 05/23/20 1115      MEDFICTS Scores   Pre Score 48           MEDIFICTS Score Key:          ?70 Need to make dietary changes          40-70 Heart Healthy Diet         ? 40 Therapeutic Level Cholesterol Diet  Nutrition Goals Re-Evaluation:   Nutrition Goals Discharge (Final Nutrition Goals Re-Evaluation):   Psychosocial: Target Goals: Acknowledge presence or absence of significant depression and/or stress, maximize coping skills, provide positive support system. Participant is able to verbalize types and ability to use techniques and skills needed for reducing stress and depression.   Education: Depression - Provides group verbal and written instruction on the correlation between heart/lung disease and depressed mood, treatment options, and the stigmas associated with seeking treatment.   Education: Sleep Hygiene -Provides group verbal and written  instruction about how sleep can affect your health.  Define sleep hygiene, discuss sleep cycles and impact of sleep habits. Review good sleep hygiene tips.     Education: Stress and Anxiety: - Provides group verbal and written instruction about the health risks of elevated stress and causes of high stress.  Discuss the correlation between heart/lung disease and anxiety and treatment options. Review healthy ways to manage with stress and anxiety.    Initial Review & Psychosocial Screening:  Initial Psych Review & Screening - 05/14/20 1343      Initial Review   Current issues with Current Depression;History of Depression;Current Anxiety/Panic;Current Stress Concerns    Source of Stress Concerns Chronic Illness;Retirement/disability;Unable to perform yard/household activities    Comments Patient has had a stroke and is up in age and he said that comes with alot of anxiety. His shortness of breath makes him very anxious. He lives alone and states that a care taker come to the house to do house work for him.      Family Dynamics   Good Support System? Yes    Comments Rayshawn cantalk to his son when he needs support.      Barriers   Psychosocial barriers to participate in program The patient should benefit from training in stress management and relaxation.      Screening Interventions   Interventions Encouraged to exercise;To provide support and resources with identified psychosocial needs;Provide feedback about the scores to participant    Expected Outcomes Short Term goal: Utilizing psychosocial counselor, staff and physician to assist with identification of specific Stressors or current issues interfering with healing process. Setting desired goal for each stressor or current issue identified.;Long Term Goal: Stressors or current issues are controlled or eliminated.;Short Term goal: Identification and review with participant of any Quality of Life or Depression concerns found by scoring the  questionnaire.;Long Term goal: The participant improves quality of Life and PHQ9 Scores as seen by post scores and/or verbalization of changes           Quality of Life Scores:   Quality of Life - 05/23/20 1114      Quality of Life   Select Quality of Life      Quality of Life Scores   Health/Function Pre 18.56 %  Socioeconomic Pre 28.8 %    Psych/Spiritual Pre 23.14 %    Family Pre 30 %    GLOBAL Pre 23 %          Scores of 19 and below usually indicate a poorer quality of life in these areas.  A difference of  2-3 points is a clinically meaningful difference.  A difference of 2-3 points in the total score of the Quality of Life Index has been associated with significant improvement in overall quality of life, self-image, physical symptoms, and general health in studies assessing change in quality of life.  PHQ-9: Recent Review Flowsheet Data   There is no flowsheet data to display.    Interpretation of Total Score  Total Score Depression Severity:  1-4 = Minimal depression, 5-9 = Mild depression, 10-14 = Moderate depression, 15-19 = Moderately severe depression, 20-27 = Severe depression   Psychosocial Evaluation and Intervention:  Psychosocial Evaluation - 05/14/20 1346      Psychosocial Evaluation & Interventions   Interventions Encouraged to exercise with the program and follow exercise prescription    Comments Patient has had a stroke and is up in age and he said that comes with alot of anxiety. His shortness of breath makes him very anxious. He lives alone and states that a care taker come to the house to do house work for him.    Expected Outcomes Short: Exercise regularly to support mental health and notify staff of any changes. Long: maintain mental health and well being through teaching of rehab or prescribed medications independently.    Continue Psychosocial Services  Follow up required by staff           Psychosocial Re-Evaluation:   Psychosocial  Discharge (Final Psychosocial Re-Evaluation):   Vocational Rehabilitation: Provide vocational rehab assistance to qualifying candidates.   Vocational Rehab Evaluation & Intervention:   Education: Education Goals: Education classes will be provided on a variety of topics geared toward better understanding of heart health and risk factor modification. Participant will state understanding/return demonstration of topics presented as noted by education test scores.  Learning Barriers/Preferences:  Learning Barriers/Preferences - 05/14/20 1343      Learning Barriers/Preferences   Learning Barriers Hearing;Sight;Exercise Concerns   History of CVA   Learning Preferences None           General Cardiac Education Topics:  AED/CPR: - Group verbal and written instruction with the use of models to demonstrate the basic use of the AED with the basic ABC's of resuscitation.   Anatomy & Physiology of the Heart: - Group verbal and written instruction and models provide basic cardiac anatomy and physiology, with the coronary electrical and arterial systems. Review of Valvular disease and Heart Failure   Cardiac Procedures: - Group verbal and written instruction to review commonly prescribed medications for heart disease. Reviews the medication, class of the drug, and side effects. Includes the steps to properly store meds and maintain the prescription regimen. (beta blockers and nitrates)   Cardiac Medications I: - Group verbal and written instruction to review commonly prescribed medications for heart disease. Reviews the medication, class of the drug, and side effects. Includes the steps to properly store meds and maintain the prescription regimen.   Cardiac Medications II: -Group verbal and written instruction to review commonly prescribed medications for heart disease. Reviews the medication, class of the drug, and side effects. (all other drug classes)    Go Sex-Intimacy & Heart  Disease, Get SMART - Goal Setting: - Group  verbal and written instruction through game format to discuss heart disease and the return to sexual intimacy. Provides group verbal and written material to discuss and apply goal setting through the application of the S.M.A.R.T. Method.   Other Matters of the Heart: - Provides group verbal, written materials and models to describe Stable Angina and Peripheral Artery. Includes description of the disease process and treatment options available to the cardiac patient.   Infection Prevention: - Provides verbal and written material to individual with discussion of infection control including proper hand washing and proper equipment cleaning during exercise session.   Cardiac Rehab from 05/23/2020 in [facility] Cardiac and Pulmonary Rehab  Date 05/14/20  Educator [facility]  Instruction Review Code 1- Verbalizes Understanding      Falls Prevention: - Provides verbal and written material to individual with discussion of falls prevention and safety.   Cardiac Rehab from 05/23/2020 in [facility] Cardiac and Pulmonary Rehab  Date 05/14/20  Educator [facility]  Instruction Review Code 1- Verbalizes Understanding      Other: -Provides group and verbal instruction on various topics (see comments)   Knowledge Questionnaire Score:  Knowledge Questionnaire Score - 05/23/20 1115      Knowledge Questionnaire Score   Pre Score --   unable to complete          Core Components/Risk Factors/Patient Goals at Admission:  Personal Goals and Risk Factors at Admission - 05/23/20 1116      Core Components/Risk Factors/Patient Goals on Admission    Weight Management Yes;Weight Loss    Intervention Weight Management: Develop a combined nutrition and exercise program designed to reach desired caloric intake, while maintaining appropriate intake of nutrient and fiber, sodium and fats, and appropriate energy expenditure required for the weight goal.;Weight Management: Provide education and  appropriate resources to help participant work on and attain dietary goals.    Admit Weight 180 lb (81.6 kg)    Goal Weight: Short Term 175 lb (79.4 kg)    Goal Weight: Long Term 170 lb (77.1 kg)    Expected Outcomes Short Term: Continue to assess and modify interventions until short term weight is achieved;Long Term: Adherence to nutrition and physical activity/exercise program aimed toward attainment of established weight goal;Weight Loss: Understanding of general recommendations for a balanced deficit meal plan, which promotes 1-2 lb weight loss per week and includes a negative energy balance of [id number] kcal/d;Understanding recommendations for meals to include 15-35% energy as protein, 25-35% energy from fat, 35-60% energy from carbohydrates, less than '200mg'$  of dietary cholesterol, 20-35 gm of total fiber daily;Understanding of distribution of calorie intake throughout the day with the consumption of 4-5 meals/snacks    Heart Failure Yes    Intervention Provide a combined exercise and nutrition program that is supplemented with education, support and counseling about heart failure. Directed toward relieving symptoms such as shortness of breath, decreased exercise tolerance, and extremity edema.    Expected Outcomes Improve functional capacity of life;Short term: Attendance in program 2-3 days a week with increased exercise capacity. Reported lower sodium intake. Reported increased fruit and vegetable intake. Reports medication compliance.;Short term: Daily weights obtained and reported for increase. Utilizing diuretic protocols set by physician.;Long term: Adoption of self-care skills and reduction of barriers for early signs and symptoms recognition and intervention leading to self-care maintenance.    Hypertension Yes    Intervention Provide education on lifestyle modifcations including regular physical activity/exercise, weight management, moderate sodium restriction and increased consumption of  fresh fruit, vegetables, and low fat  dairy, alcohol moderation, and smoking cessation.;Monitor prescription use compliance.    Expected Outcomes Short Term: Continued assessment and intervention until BP is < 140/45m HG in hypertensive participants. < 130/866mHG in hypertensive participants with diabetes, heart failure or chronic kidney disease.;Long Term: Maintenance of blood pressure at goal levels.    Lipids Yes    Intervention Provide education and support for participant on nutrition & aerobic/resistive exercise along with prescribed medications to achieve LDL '70mg'$ , HDL >'40mg'$ .    Expected Outcomes Short Term: Participant states understanding of desired cholesterol values and is compliant with medications prescribed. Participant is following exercise prescription and nutrition guidelines.;Long Term: Cholesterol controlled with medications as prescribed, with individualized exercise RX and with personalized nutrition plan. Value goals: LDL < '70mg'$ , HDL > 40 mg.           Education:Diabetes - Individual verbal and written instruction to review signs/symptoms of diabetes, desired ranges of glucose level fasting, after meals and with exercise. Acknowledge that pre and post exercise glucose checks will be done for 3 sessions at entry of program.   Education: Know Your Numbers and Risk Factors: -Group verbal and written instruction about important numbers in your health.  Discussion of what are risk factors and how they play a role in the disease process.  Review of Cholesterol, Blood Pressure, Diabetes, and BMI and the role they play in your overall health.   Core Components/Risk Factors/Patient Goals Review:    Core Components/Risk Factors/Patient Goals at Discharge (Final Review):    ITP Comments:  ITP Comments    Row Name 05/14/20 1347 05/23/20 1050         ITP Comments Virtual Visit completed. Patient informed on EP and RD appointment and 6 Minute walk test. Patient also informed  of patient health questionnaires on My Chart. Patient Verbalizes understanding. [facility]. Completed 6MWT and gym orientation. Initial ITP created and sent for review to Dr. MaEmily FilbertMedical Director.             Comments: Initial ITP

## 2020-05-25 DIAGNOSIS — H401131 Primary open-angle glaucoma, bilateral, mild stage: Secondary | ICD-10-CM | POA: Diagnosis not present

## 2020-05-29 ENCOUNTER — Encounter: Payer: Medicare HMO | Admitting: *Deleted

## 2020-05-29 ENCOUNTER — Other Ambulatory Visit: Payer: Self-pay

## 2020-05-29 DIAGNOSIS — H409 Unspecified glaucoma: Secondary | ICD-10-CM | POA: Diagnosis not present

## 2020-05-29 DIAGNOSIS — I5020 Unspecified systolic (congestive) heart failure: Secondary | ICD-10-CM | POA: Diagnosis not present

## 2020-05-29 DIAGNOSIS — Z7982 Long term (current) use of aspirin: Secondary | ICD-10-CM | POA: Diagnosis not present

## 2020-05-29 DIAGNOSIS — I5022 Chronic systolic (congestive) heart failure: Secondary | ICD-10-CM

## 2020-05-29 DIAGNOSIS — I110 Hypertensive heart disease with heart failure: Secondary | ICD-10-CM | POA: Diagnosis not present

## 2020-05-29 DIAGNOSIS — K219 Gastro-esophageal reflux disease without esophagitis: Secondary | ICD-10-CM | POA: Diagnosis not present

## 2020-05-29 DIAGNOSIS — J449 Chronic obstructive pulmonary disease, unspecified: Secondary | ICD-10-CM | POA: Diagnosis not present

## 2020-05-29 DIAGNOSIS — I2510 Atherosclerotic heart disease of native coronary artery without angina pectoris: Secondary | ICD-10-CM | POA: Diagnosis not present

## 2020-05-29 DIAGNOSIS — Z950 Presence of cardiac pacemaker: Secondary | ICD-10-CM | POA: Diagnosis not present

## 2020-05-29 DIAGNOSIS — G629 Polyneuropathy, unspecified: Secondary | ICD-10-CM | POA: Diagnosis not present

## 2020-05-29 NOTE — Progress Notes (Signed)
Daily Session Note  Patient Details  Name: JERMERY CARATACHEA MRN: [id number] Date of Birth: Nov 19, 1927 Referring Provider:     Cardiac Rehab from 05/23/2020 in [facility]  Referring Provider Serafina Royals MD      Encounter Date: 05/29/2020  Check In:  Session Check In - 05/29/20 1025      Check-In   Supervising physician immediately available to respond to emergencies See telemetry face sheet for immediately available ER MD    Location [facility]-Cardiac & Pulmonary Rehab    Staff Present Heath Lark, RN, BSN, CCRP;Melissa Baskerville RDN, Rowe Pavy, BA, ACSM CEP, Exercise Physiologist    Virtual Visit No    Medication changes reported     No    Fall or balance concerns reported    No    Warm-up and Cool-down Performed on first and last piece of equipment    Resistance Training Performed Yes    VAD Patient? No    PAD/SET Patient? No      Pain Assessment   Currently in Pain? No/denies              Social History   Tobacco Use  Smoking Status Former Smoker  . Packs/day: 1.50  . Years: 38.00  . Pack years: 57.00  . Types: Cigarettes  . Quit date: 11/03/1984  . Years since quitting: 35.5  Smokeless Tobacco Never Used    Goals Met:  Exercise tolerated well Personal goals reviewed No report of cardiac concerns or symptoms  Goals Unmet:  Not Applicable  Comments: First full day of exercise!  Patient was oriented to gym and equipment including functions, settings, policies, and procedures.  Patient's individual exercise prescription and treatment plan were reviewed.  All starting workloads were established based on the results of the 6 minute walk test done at initial orientation visit.  The plan for exercise progression was also introduced and progression will be customized based on patient's performance and goals. Mikki Santee was not strong enough to get out of the transport chair to get on scales. He also required assistance to get on and off his  exercise equipment.  Note sent to his PMD and referring MD to ask for outpatient Physical Therapy referral and treatment  before  Mikki Santee can return to Cardiac Rehab.  He must be self sufficient to get on and off equipment to attend the program.      Dr. Emily Filbert is Medical Director for [facility].

## 2020-05-31 ENCOUNTER — Telehealth: Payer: Self-pay | Admitting: *Deleted

## 2020-05-31 NOTE — Telephone Encounter (Signed)
Spoke with Darrell Jacobson today to let him know we have placed a hold on his Cardiac Rehab. We have sent request to his PMD to refer Darrell Jacobson for outpatient PT to help hm get strong enough to be able to get into the gym, get on and off equipment without assistance.  Darrell Kagawa verbalized understanding.  He did state he has has PT several other times.

## 2020-06-13 ENCOUNTER — Encounter: Payer: Self-pay | Admitting: *Deleted

## 2020-06-13 DIAGNOSIS — H109 Unspecified conjunctivitis: Secondary | ICD-10-CM | POA: Diagnosis not present

## 2020-06-13 DIAGNOSIS — B0229 Other postherpetic nervous system involvement: Secondary | ICD-10-CM | POA: Diagnosis not present

## 2020-06-13 DIAGNOSIS — R42 Dizziness and giddiness: Secondary | ICD-10-CM | POA: Diagnosis not present

## 2020-06-13 DIAGNOSIS — I5022 Chronic systolic (congestive) heart failure: Secondary | ICD-10-CM

## 2020-06-13 DIAGNOSIS — I69393 Ataxia following cerebral infarction: Secondary | ICD-10-CM | POA: Diagnosis not present

## 2020-06-13 NOTE — Progress Notes (Signed)
Cardiac Individual Treatment Plan  Patient Details  Name: Darrell Jacobson MRN: [id number] Date of Birth: 03/09/1928 Referring Provider:     Cardiac Rehab from 05/23/2020 in [facility] Cardiac and Pulmonary Rehab  Referring Provider Arnoldo Hooker MD      Initial Encounter Date:    Cardiac Rehab from 05/23/2020 in [facility] Cardiac and Pulmonary Rehab  Date 05/23/20      Visit Diagnosis: Heart failure, chronic systolic (HCC)  Patient's Home Medications on Admission:  Current Outpatient Medications:  .  albuterol (PROAIR HFA) 108 (90 BASE) MCG/ACT inhaler, Inhale 2 puffs into the lungs every 6 (six) hours as needed., Disp: , Rfl:  .  aspirin EC 81 MG tablet, Take 1 tablet by mouth daily., Disp: , Rfl:  .  bimatoprost (LUMIGAN) 0.01 % SOLN, Apply 1 Dose to eye at bedtime., Disp: , Rfl:  .  BREO ELLIPTA 100-25 MCG/INH AEPB, Inhale 1 puff into the lungs daily., Disp: , Rfl:  .  Cholecalciferol (D 2000) 2000 UNITS TABS, Take 1 tablet by mouth daily., Disp: , Rfl:  .  doxazosin (CARDURA) 8 MG tablet, Take 1 tablet by mouth at bedtime., Disp: , Rfl:  .  feeding supplement, ENSURE ENLIVE, (ENSURE ENLIVE) LIQD, Take 237 mLs by mouth 2 (two) times daily between meals., Disp: 14220 mL, Rfl: 0 .  Ferrous Sulfate (SLOW FE PO), Take by mouth., Disp: , Rfl:  .  LORazepam (ATIVAN) 0.5 MG tablet, Take 1 tablet (0.5 mg total) by mouth 2 (two) times daily as needed for anxiety. (Patient taking differently: Take 0.5 mg by mouth 3 (three) times daily. ), Disp: 10 tablet, Rfl: 0 .  montelukast (SINGULAIR) 10 MG tablet, Take 1 tablet by mouth at bedtime., Disp: , Rfl:  .  Multiple Vitamin (MULTI-VITAMINS) TABS, Take 1 tablet by mouth daily., Disp: , Rfl:  .  nebivolol (BYSTOLIC) 2.5 MG tablet, Take 1 tablet by mouth 3 (three) times daily., Disp: , Rfl:  .  omeprazole (PRILOSEC) 20 MG capsule, Take 1 capsule by mouth daily., Disp: , Rfl:  .  prednisoLONE acetate (PRED FORTE) 1 % ophthalmic suspension, Place 1 drop  into the right eye daily., Disp: , Rfl:  .  valACYclovir (VALTREX) 500 MG tablet, Take 500 mg by mouth daily., Disp: , Rfl:   Past Medical History: Past Medical History:  Diagnosis Date  . Arthritis   . Asthma    COPD with inhaler use  . Cancer [facility]) 2013   Basal Cell Skin CA resected from scalp.  . Carotid stenosis    pateint states he has Left carotid blockage worse than Right.   . CHF (congestive heart failure) (HCC)   . Collagen vascular disease (HCC)   . COPD (chronic obstructive pulmonary disease) (HCC)   . Coronary artery disease   . Dyspnea   . GERD (gastroesophageal reflux disease)   . Glaucoma   . History of hiatal hernia   . History of shingles 2012   forehead  . Hypertension   . Multilevel degenerative disc disease   . Neuropathy    legs and feet  . Pacemaker 2006    Tobacco Use: Social History   Tobacco Use  Smoking Status Former Smoker  . Packs/day: 1.50  . Years: 38.00  . Pack years: 57.00  . Types: Cigarettes  . Quit date: 11/03/1984  . Years since quitting: 35.6  Smokeless Tobacco Never Used    Labs: Recent Review Advice worker    Labs for ITP Cardiac and Pulmonary  Rehab Latest Ref Rng & Units 03/16/2014 05/03/2019   Cholestrol 0 - 200 mg/dL 180 150   LDLCALC 0 - 99 mg/dL 87 73   HDL >40 mg/dL 59 69   Trlycerides <150 mg/dL 169 42   Hemoglobin A1c 4.8 - 5.6 % - 5.2       Exercise Target Goals: Exercise Program Goal: Individual exercise prescription set using results from initial 6 min walk test and THRR while considering  patient's activity barriers and safety.   Exercise Prescription Goal: Initial exercise prescription builds to 30-45 minutes a day of aerobic activity, 2-3 days per week.  Home exercise guidelines will be given to patient during program as part of exercise prescription that the participant will acknowledge.   Education: Aerobic Exercise & Resistance Training: - Gives group verbal and written instruction on the various  components of exercise. Focuses on aerobic and resistive training programs and the benefits of this training and how to safely progress through these programs..   Education: Exercise & Equipment Safety: - Individual verbal instruction and demonstration of equipment use and safety with use of the equipment.   Cardiac Rehab from 05/23/2020 in [facility] Cardiac and Pulmonary Rehab  Date 05/14/20  Educator [facility]  Instruction Review Code 1- Verbalizes Understanding      Education: Exercise Physiology & General Exercise Guidelines: - Group verbal and written instruction with models to review the exercise physiology of the cardiovascular system and associated critical values. Provides general exercise guidelines with specific guidelines to those with heart or lung disease.    Education: Flexibility, Balance, Mind/Body Relaxation: Provides group verbal/written instruction on the benefits of flexibility and balance training, including mind/body exercise modes such as yoga, pilates and tai chi.  Demonstration and skill practice provided.   Activity Barriers & Risk Stratification:  Activity Barriers & Cardiac Risk Stratification - 05/23/20 1058      Activity Barriers & Cardiac Risk Stratification   Activity Barriers Arthritis;Right Knee Replacement;Joint Problems;Deconditioning;Muscular Weakness;History of Falls;Assistive Device;Balance Concerns;Other (comment)    Comments unsteady, uses wheelchair at home, better using walker, arth all over especially left knee, r leg weak from stroke and gives out, constant dizziness, blurry vision    Cardiac Risk Stratification High           6 Minute Walk:  6 Minute Walk    Row Name 05/23/20 1051         6 Minute Walk   Phase Initial  using walker with chair behind     Distance 145 feet     Walk Time 5.5 minutes     # of Rest Breaks 1  stopped with 30 sec left     MPH 0.3     RPE 13     Symptoms Yes (comment)     Comments R leg weak, knee give out,  dizziness, vision blurry     Resting HR 68 bpm     Resting BP 138/74     Resting Oxygen Saturation  95 %     Exercise Oxygen Saturation  during 6 min walk 96 %     Max Ex. HR 83 bpm     Max Ex. BP 164/84     2 Minute Post BP 146/62            Oxygen Initial Assessment:   Oxygen Re-Evaluation:   Oxygen Discharge (Final Oxygen Re-Evaluation):   Initial Exercise Prescription:  Initial Exercise Prescription - 05/23/20 1000      Date of Initial Exercise  RX and Referring Provider   Date 05/23/20    Referring Provider Serafina Royals MD      NuStep   Level 1    SPM 80    Minutes 30    METs 1.5      Biostep-RELP   Level 1    SPM 50    Minutes 15    METs 1      Prescription Details   Frequency (times per week) 2    Duration Progress to 30 minutes of continuous aerobic without signs/symptoms of physical distress      Intensity   THRR 40-80% of Max Heartrate 93-118    Ratings of Perceived Exertion 11-13    Perceived Dyspnea 0-4      Progression   Progression Continue to progress workloads to maintain intensity without signs/symptoms of physical distress.      Resistance Training   Training Prescription Yes    Weight 3 lb    Reps 10-15           Perform Capillary Blood Glucose checks as needed.  Exercise Prescription Changes:  Exercise Prescription Changes    Row Name 05/23/20 1000 05/29/20 1500           Response to Exercise   Blood Pressure (Admit) 138/74 136/74      Blood Pressure (Exercise) 164/84 148/76      Blood Pressure (Exit) 146/62 124/74      Heart Rate (Admit) 68 bpm 72 bpm      Heart Rate (Exercise) 83 bpm 115 bpm      Heart Rate (Exit) 72 bpm 83 bpm      Oxygen Saturation (Admit) 95 % --      Oxygen Saturation (Exercise) 96 % --      Rating of Perceived Exertion (Exercise) 13 12      Symptoms leg weakness, knee giving way, dizziness, blurry vision leg weakness, difficulty transferring      Comments walk test results first full day of  exercise      Duration -- Progress to 30 minutes of  aerobic without signs/symptoms of physical distress      Intensity -- THRR unchanged        Progression   Progression -- Continue to progress workloads to maintain intensity without signs/symptoms of physical distress.      Average METs -- 1.7        Resistance Training   Training Prescription -- Yes      Weight -- 3 lb      Reps -- 10-15        Interval Training   Interval Training -- No        NuStep   Level -- 1      Minutes -- 30      METs -- 1.7             Exercise Comments:  Exercise Comments    Row Name 05/29/20 1030           Exercise Comments First full day of exercise!  Patient was oriented to gym and equipment including functions, settings, policies, and procedures.  Patient's individual exercise prescription and treatment plan were reviewed.  All starting workloads were established based on the results of the 6 minute walk test done at initial orientation visit.  The plan for exercise progression was also introduced and progression will be customized based on patient's performance and goals.Darrell Jacobson was not strong enough to get out of the transport chair to  get on scales. He also required assistance to get on and off his exercise equipment.  Note sent to his PMD and referring MD to ask for outpatient Physical Therapy referral and treatment  before  Darrell Jacobson can return to Cardiac Rehab.  He must be self sufficient to get on and off equipment to attend the program.              Exercise Goals and Review:  Exercise Goals    Row Name 05/23/20 1113             Exercise Goals   Increase Physical Activity Yes       Intervention Provide advice, education, support and counseling about physical activity/exercise needs.;Develop an individualized exercise prescription for aerobic and resistive training based on initial evaluation findings, risk stratification, comorbidities and participant's personal goals.       Expected  Outcomes Short Term: Attend rehab on a regular basis to increase amount of physical activity.;Long Term: Add in home exercise to make exercise part of routine and to increase amount of physical activity.;Long Term: Exercising regularly at least 3-5 days a week.       Increase Strength and Stamina Yes       Intervention Provide advice, education, support and counseling about physical activity/exercise needs.;Develop an individualized exercise prescription for aerobic and resistive training based on initial evaluation findings, risk stratification, comorbidities and participant's personal goals.       Expected Outcomes Short Term: Increase workloads from initial exercise prescription for resistance, speed, and METs.;Short Term: Perform resistance training exercises routinely during rehab and add in resistance training at home;Long Term: Improve cardiorespiratory fitness, muscular endurance and strength as measured by increased METs and functional capacity (6MWT)       Able to understand and use rate of perceived exertion (RPE) scale Yes       Intervention Provide education and explanation on how to use RPE scale       Expected Outcomes Short Term: Able to use RPE daily in rehab to express subjective intensity level;Long Term:  Able to use RPE to guide intensity level when exercising independently       Able to understand and use Dyspnea scale Yes       Intervention Provide education and explanation on how to use Dyspnea scale       Expected Outcomes Short Term: Able to use Dyspnea scale daily in rehab to express subjective sense of shortness of breath during exertion;Long Term: Able to use Dyspnea scale to guide intensity level when exercising independently       Knowledge and understanding of Target Heart Rate Range (THRR) Yes       Intervention Provide education and explanation of THRR including how the numbers were predicted and where they are located for reference       Expected Outcomes Short Term:  Able to state/look up THRR;Short Term: Able to use daily as guideline for intensity in rehab;Long Term: Able to use THRR to govern intensity when exercising independently       Able to check pulse independently Yes       Intervention Provide education and demonstration on how to check pulse in carotid and radial arteries.;Review the importance of being able to check your own pulse for safety during independent exercise       Expected Outcomes Short Term: Able to explain why pulse checking is important during independent exercise;Long Term: Able to check pulse independently and accurately  Understanding of Exercise Prescription Yes       Intervention Provide education, explanation, and written materials on patient's individual exercise prescription       Expected Outcomes Short Term: Able to explain program exercise prescription;Long Term: Able to explain home exercise prescription to exercise independently              Exercise Goals Re-Evaluation :  Exercise Goals Re-Evaluation    Row Name 05/29/20 1030             Exercise Goal Re-Evaluation   Exercise Goals Review Able to understand and use rate of perceived exertion (RPE) scale;Knowledge and understanding of Target Heart Rate Range (THRR);Understanding of Exercise Prescription       Comments Reviewed RPE and dyspnea scales, THR and program prescription with pt today.  Pt voiced understanding and was given a copy of goals to take home.       Expected Outcomes Short: Use RPE daily to regulate intensity. Long: Follow program prescription in THR.              Discharge Exercise Prescription (Final Exercise Prescription Changes):  Exercise Prescription Changes - 05/29/20 1500      Response to Exercise   Blood Pressure (Admit) 136/74    Blood Pressure (Exercise) 148/76    Blood Pressure (Exit) 124/74    Heart Rate (Admit) 72 bpm    Heart Rate (Exercise) 115 bpm    Heart Rate (Exit) 83 bpm    Rating of Perceived Exertion  (Exercise) 12    Symptoms leg weakness, difficulty transferring    Comments first full day of exercise    Duration Progress to 30 minutes of  aerobic without signs/symptoms of physical distress    Intensity THRR unchanged      Progression   Progression Continue to progress workloads to maintain intensity without signs/symptoms of physical distress.    Average METs 1.7      Resistance Training   Training Prescription Yes    Weight 3 lb    Reps 10-15      Interval Training   Interval Training No      NuStep   Level 1    Minutes 30    METs 1.7           Nutrition:  Target Goals: Understanding of nutrition guidelines, daily intake of sodium <15107m, cholesterol <2047m calories 30% from fat and 7% or less from saturated fats, daily to have 5 or more servings of fruits and vegetables.  Education: Controlling Sodium/Reading Food Labels -Group verbal and written material supporting the discussion of sodium use in heart healthy nutrition. Review and explanation with models, verbal and written materials for utilization of the food label.   Education: General Nutrition Guidelines/Fats and Fiber: -Group instruction provided by verbal, written material, models and posters to present the general guidelines for heart healthy nutrition. Gives an explanation and review of dietary fats and fiber.   Biometrics:  Pre Biometrics - 05/23/20 1114      Pre Biometrics   Height _0  (1.676 m)   unable to stand unsupported   Weight 180 lb (81.6 kg)    BMI (Calculated) 29.07            Nutrition Therapy Plan and Nutrition Goals:   Nutrition Assessments:  Nutrition Assessments - 05/23/20 1115      MEDFICTS Scores   Pre Score 48           MEDIFICTS Score Key:          ?  70 Need to make dietary changes          40-70 Heart Healthy Diet         ? 40 Therapeutic Level Cholesterol Diet  Nutrition Goals Re-Evaluation:   Nutrition Goals Discharge (Final Nutrition Goals  Re-Evaluation):   Psychosocial: Target Goals: Acknowledge presence or absence of significant depression and/or stress, maximize coping skills, provide positive support system. Participant is able to verbalize types and ability to use techniques and skills needed for reducing stress and depression.   Education: Depression - Provides group verbal and written instruction on the correlation between heart/lung disease and depressed mood, treatment options, and the stigmas associated with seeking treatment.   Education: Sleep Hygiene -Provides group verbal and written instruction about how sleep can affect your health.  Define sleep hygiene, discuss sleep cycles and impact of sleep habits. Review good sleep hygiene tips.     Education: Stress and Anxiety: - Provides group verbal and written instruction about the health risks of elevated stress and causes of high stress.  Discuss the correlation between heart/lung disease and anxiety and treatment options. Review healthy ways to manage with stress and anxiety.    Initial Review & Psychosocial Screening:  Initial Psych Review & Screening - 05/14/20 1343      Initial Review   Current issues with Current Depression;History of Depression;Current Anxiety/Panic;Current Stress Concerns    Source of Stress Concerns Chronic Illness;Retirement/disability;Unable to perform yard/household activities    Comments Patient has had a stroke and is up in age and he said that comes with alot of anxiety. His shortness of breath makes him very anxious. He lives alone and states that a care taker come to the house to do house work for him.      Family Dynamics   Good Support System? Yes    Comments Darrell Jacobson cantalk to his son when he needs support.      Barriers   Psychosocial barriers to participate in program The patient should benefit from training in stress management and relaxation.      Screening Interventions   Interventions Encouraged to exercise;To  provide support and resources with identified psychosocial needs;Provide feedback about the scores to participant    Expected Outcomes Short Term goal: Utilizing psychosocial counselor, staff and physician to assist with identification of specific Stressors or current issues interfering with healing process. Setting desired goal for each stressor or current issue identified.;Long Term Goal: Stressors or current issues are controlled or eliminated.;Short Term goal: Identification and review with participant of any Quality of Life or Depression concerns found by scoring the questionnaire.;Long Term goal: The participant improves quality of Life and PHQ9 Scores as seen by post scores and/or verbalization of changes           Quality of Life Scores:   Quality of Life - 05/23/20 1114      Quality of Life   Select Quality of Life      Quality of Life Scores   Health/Function Pre 18.56 %    Socioeconomic Pre 28.8 %    Psych/Spiritual Pre 23.14 %    Family Pre 30 %    GLOBAL Pre 23 %          Scores of 19 and below usually indicate a poorer quality of life in these areas.  A difference of  2-3 points is a clinically meaningful difference.  A difference of 2-3 points in the total score of the Quality of Life Index has been associated with  significant improvement in overall quality of life, self-image, physical symptoms, and general health in studies assessing change in quality of life.  PHQ-9: Recent Review Flowsheet Data    Depression screen [facility] 2/9 05/23/2020   Decreased Interest 2   Down, Depressed, Hopeless 1   PHQ - 2 Score 3   Altered sleeping 0   Tired, decreased energy 1   Change in appetite 1   Feeling bad or failure about yourself  1   Trouble concentrating 0   Moving slowly or fidgety/restless 0   Suicidal thoughts 0   PHQ-9 Score 6   Difficult doing work/chores Somewhat difficult     Interpretation of Total Score  Total Score Depression Severity:  1-4 = Minimal  depression, 5-9 = Mild depression, 10-14 = Moderate depression, 15-19 = Moderately severe depression, 20-27 = Severe depression   Psychosocial Evaluation and Intervention:  Psychosocial Evaluation - 05/14/20 1346      Psychosocial Evaluation & Interventions   Interventions Encouraged to exercise with the program and follow exercise prescription    Comments Patient has had a stroke and is up in age and he said that comes with alot of anxiety. His shortness of breath makes him very anxious. He lives alone and states that a care taker come to the house to do house work for him.    Expected Outcomes Short: Exercise regularly to support mental health and notify staff of any changes. Long: maintain mental health and well being through teaching of rehab or prescribed medications independently.    Continue Psychosocial Services  Follow up required by staff           Psychosocial Re-Evaluation:   Psychosocial Discharge (Final Psychosocial Re-Evaluation):   Vocational Rehabilitation: Provide vocational rehab assistance to qualifying candidates.   Vocational Rehab Evaluation & Intervention:   Education: Education Goals: Education classes will be provided on a variety of topics geared toward better understanding of heart health and risk factor modification. Participant will state understanding/return demonstration of topics presented as noted by education test scores.  Learning Barriers/Preferences:  Learning Barriers/Preferences - 05/14/20 1343      Learning Barriers/Preferences   Learning Barriers Hearing;Sight;Exercise Concerns   History of CVA   Learning Preferences None           General Cardiac Education Topics:  AED/CPR: - Group verbal and written instruction with the use of models to demonstrate the basic use of the AED with the basic ABC's of resuscitation.   Anatomy & Physiology of the Heart: - Group verbal and written instruction and models provide basic cardiac anatomy  and physiology, with the coronary electrical and arterial systems. Review of Valvular disease and Heart Failure   Cardiac Procedures: - Group verbal and written instruction to review commonly prescribed medications for heart disease. Reviews the medication, class of the drug, and side effects. Includes the steps to properly store meds and maintain the prescription regimen. (beta blockers and nitrates)   Cardiac Medications I: - Group verbal and written instruction to review commonly prescribed medications for heart disease. Reviews the medication, class of the drug, and side effects. Includes the steps to properly store meds and maintain the prescription regimen.   Cardiac Medications II: -Group verbal and written instruction to review commonly prescribed medications for heart disease. Reviews the medication, class of the drug, and side effects. (all other drug classes)    Go Sex-Intimacy & Heart Disease, Get SMART - Goal Setting: - Group verbal and written instruction through game format  to discuss heart disease and the return to sexual intimacy. Provides group verbal and written material to discuss and apply goal setting through the application of the S.M.A.R.T. Method.   Other Matters of the Heart: - Provides group verbal, written materials and models to describe Stable Angina and Peripheral Artery. Includes description of the disease process and treatment options available to the cardiac patient.   Infection Prevention: - Provides verbal and written material to individual with discussion of infection control including proper hand washing and proper equipment cleaning during exercise session.   Cardiac Rehab from 05/23/2020 in [facility] Cardiac and Pulmonary Rehab  Date 05/14/20  Educator [facility]  Instruction Review Code 1- Verbalizes Understanding      Falls Prevention: - Provides verbal and written material to individual with discussion of falls prevention and safety.   Cardiac Rehab  from 05/23/2020 in [facility] Cardiac and Pulmonary Rehab  Date 05/14/20  Educator [facility]  Instruction Review Code 1- Verbalizes Understanding      Other: -Provides group and verbal instruction on various topics (see comments)   Knowledge Questionnaire Score:  Knowledge Questionnaire Score - 05/23/20 1115      Knowledge Questionnaire Score   Pre Score --   unable to complete          Core Components/Risk Factors/Patient Goals at Admission:  Personal Goals and Risk Factors at Admission - 05/23/20 1116      Core Components/Risk Factors/Patient Goals on Admission    Weight Management Yes;Weight Loss    Intervention Weight Management: Develop a combined nutrition and exercise program designed to reach desired caloric intake, while maintaining appropriate intake of nutrient and fiber, sodium and fats, and appropriate energy expenditure required for the weight goal.;Weight Management: Provide education and appropriate resources to help participant work on and attain dietary goals.    Admit Weight 180 lb (81.6 kg)    Goal Weight: Short Term 175 lb (79.4 kg)    Goal Weight: Long Term 170 lb (77.1 kg)    Expected Outcomes Short Term: Continue to assess and modify interventions until short term weight is achieved;Long Term: Adherence to nutrition and physical activity/exercise program aimed toward attainment of established weight goal;Weight Loss: Understanding of general recommendations for a balanced deficit meal plan, which promotes 1-2 lb weight loss per week and includes a negative energy balance of [phone] kcal/d;Understanding recommendations for meals to include 15-35% energy as protein, 25-35% energy from fat, 35-60% energy from carbohydrates, less than 252m of dietary cholesterol, 20-35 gm of total fiber daily;Understanding of distribution of calorie intake throughout the day with the consumption of 4-5 meals/snacks    Heart Failure Yes    Intervention Provide a combined exercise and nutrition  program that is supplemented with education, support and counseling about heart failure. Directed toward relieving symptoms such as shortness of breath, decreased exercise tolerance, and extremity edema.    Expected Outcomes Improve functional capacity of life;Short term: Attendance in program 2-3 days a week with increased exercise capacity. Reported lower sodium intake. Reported increased fruit and vegetable intake. Reports medication compliance.;Short term: Daily weights obtained and reported for increase. Utilizing diuretic protocols set by physician.;Long term: Adoption of self-care skills and reduction of barriers for early signs and symptoms recognition and intervention leading to self-care maintenance.    Hypertension Yes    Intervention Provide education on lifestyle modifcations including regular physical activity/exercise, weight management, moderate sodium restriction and increased consumption of fresh fruit, vegetables, and low fat dairy, alcohol moderation, and smoking cessation.;Monitor prescription  use compliance.    Expected Outcomes Short Term: Continued assessment and intervention until BP is < 140/63m HG in hypertensive participants. < 130/860mHG in hypertensive participants with diabetes, heart failure or chronic kidney disease.;Long Term: Maintenance of blood pressure at goal levels.    Lipids Yes    Intervention Provide education and support for participant on nutrition & aerobic/resistive exercise along with prescribed medications to achieve LDL '70mg'$ , HDL >'40mg'$ .    Expected Outcomes Short Term: Participant states understanding of desired cholesterol values and is compliant with medications prescribed. Participant is following exercise prescription and nutrition guidelines.;Long Term: Cholesterol controlled with medications as prescribed, with individualized exercise RX and with personalized nutrition plan. Value goals: LDL < '70mg'$ , HDL > 40 mg.           Education:Diabetes -  Individual verbal and written instruction to review signs/symptoms of diabetes, desired ranges of glucose level fasting, after meals and with exercise. Acknowledge that pre and post exercise glucose checks will be done for 3 sessions at entry of program.   Education: Know Your Numbers and Risk Factors: -Group verbal and written instruction about important numbers in your health.  Discussion of what are risk factors and how they play a role in the disease process.  Review of Cholesterol, Blood Pressure, Diabetes, and BMI and the role they play in your overall health.   Core Components/Risk Factors/Patient Goals Review:    Core Components/Risk Factors/Patient Goals at Discharge (Final Review):    ITP Comments:  ITP Comments    Row Name 05/14/20 1347 05/23/20 1050 05/29/20 1029 06/13/20 0724     ITP Comments Virtual Visit completed. Patient informed on EP and RD appointment and 6 Minute walk test. Patient also informed of patient health questionnaires on My Chart. Patient Verbalizes understanding. [facility]. Completed 6MWT and gym orientation. Initial ITP created and sent for review to Dr. MaEmily FilbertMedical Director. First full day of exercise!  Patient was oriented to gym and equipment including functions, settings, policies, and procedures.  Patient's individual exercise prescription and treatment plan were reviewed.  All starting workloads were established based on the results of the 6 minute walk test done at initial orientation visit.  The plan for exercise progression was also introduced and progression will be customized based on patient's performance and goals.BoMikki Santeeas not strong enough to get out of the transport chair to get on scales. He also required assistance to get on and off his exercise equipment.  Note sent to his PMD and referring MD to ask for outpatient Physical Therapy referral and treatment  before  BoMikki Santeean return to Cardiac Rehab.  He must  be self sufficient to get on and off equipment to attend the program. 30 Day review completed. Medical Director ITP review done, changes made as directed, and signed approval by Medical Director.           Comments:

## 2020-06-21 DIAGNOSIS — I2510 Atherosclerotic heart disease of native coronary artery without angina pectoris: Secondary | ICD-10-CM | POA: Diagnosis not present

## 2020-06-21 DIAGNOSIS — J432 Centrilobular emphysema: Secondary | ICD-10-CM | POA: Diagnosis not present

## 2020-06-21 DIAGNOSIS — I69393 Ataxia following cerebral infarction: Secondary | ICD-10-CM | POA: Diagnosis not present

## 2020-06-21 DIAGNOSIS — I5022 Chronic systolic (congestive) heart failure: Secondary | ICD-10-CM | POA: Diagnosis not present

## 2020-06-21 DIAGNOSIS — I4891 Unspecified atrial fibrillation: Secondary | ICD-10-CM | POA: Diagnosis not present

## 2020-06-21 DIAGNOSIS — I739 Peripheral vascular disease, unspecified: Secondary | ICD-10-CM | POA: Diagnosis not present

## 2020-06-21 DIAGNOSIS — I442 Atrioventricular block, complete: Secondary | ICD-10-CM | POA: Diagnosis not present

## 2020-06-21 DIAGNOSIS — I110 Hypertensive heart disease with heart failure: Secondary | ICD-10-CM | POA: Diagnosis not present

## 2020-06-21 DIAGNOSIS — G629 Polyneuropathy, unspecified: Secondary | ICD-10-CM | POA: Diagnosis not present

## 2020-06-22 DIAGNOSIS — I10 Essential (primary) hypertension: Secondary | ICD-10-CM | POA: Diagnosis not present

## 2020-06-22 DIAGNOSIS — B0229 Other postherpetic nervous system involvement: Secondary | ICD-10-CM | POA: Diagnosis not present

## 2020-06-22 DIAGNOSIS — I69393 Ataxia following cerebral infarction: Secondary | ICD-10-CM | POA: Diagnosis not present

## 2020-06-22 DIAGNOSIS — J432 Centrilobular emphysema: Secondary | ICD-10-CM | POA: Diagnosis not present

## 2020-06-26 DIAGNOSIS — I442 Atrioventricular block, complete: Secondary | ICD-10-CM | POA: Diagnosis not present

## 2020-06-28 DIAGNOSIS — I2510 Atherosclerotic heart disease of native coronary artery without angina pectoris: Secondary | ICD-10-CM | POA: Diagnosis not present

## 2020-06-28 DIAGNOSIS — I739 Peripheral vascular disease, unspecified: Secondary | ICD-10-CM | POA: Diagnosis not present

## 2020-06-28 DIAGNOSIS — I69393 Ataxia following cerebral infarction: Secondary | ICD-10-CM | POA: Diagnosis not present

## 2020-06-28 DIAGNOSIS — J432 Centrilobular emphysema: Secondary | ICD-10-CM | POA: Diagnosis not present

## 2020-06-28 DIAGNOSIS — I5022 Chronic systolic (congestive) heart failure: Secondary | ICD-10-CM | POA: Diagnosis not present

## 2020-06-28 DIAGNOSIS — I442 Atrioventricular block, complete: Secondary | ICD-10-CM | POA: Diagnosis not present

## 2020-06-28 DIAGNOSIS — I110 Hypertensive heart disease with heart failure: Secondary | ICD-10-CM | POA: Diagnosis not present

## 2020-07-01 ENCOUNTER — Emergency Department
Admission: EM | Admit: 2020-07-01 | Discharge: 2020-07-01 | Disposition: A | Discharge status: Home / Self Care | Payer: Medicare HMO | Attending: Emergency Medicine | Admitting: Emergency Medicine

## 2020-07-01 ENCOUNTER — Encounter: Payer: Self-pay | Admitting: Emergency Medicine

## 2020-07-01 ENCOUNTER — Other Ambulatory Visit: Payer: Self-pay

## 2020-07-01 DIAGNOSIS — R0902 Hypoxemia: Secondary | ICD-10-CM | POA: Diagnosis not present

## 2020-07-01 DIAGNOSIS — Z5321 Procedure and treatment not carried out due to patient leaving prior to being seen by health care provider: Secondary | ICD-10-CM | POA: Insufficient documentation

## 2020-07-01 DIAGNOSIS — R410 Disorientation, unspecified: Secondary | ICD-10-CM | POA: Diagnosis not present

## 2020-07-01 DIAGNOSIS — R200 Anesthesia of skin: Secondary | ICD-10-CM | POA: Diagnosis not present

## 2020-07-01 DIAGNOSIS — R42 Dizziness and giddiness: Secondary | ICD-10-CM | POA: Diagnosis not present

## 2020-07-01 DIAGNOSIS — R0602 Shortness of breath: Secondary | ICD-10-CM | POA: Diagnosis not present

## 2020-07-01 DIAGNOSIS — R001 Bradycardia, unspecified: Secondary | ICD-10-CM | POA: Diagnosis not present

## 2020-07-01 DIAGNOSIS — R5381 Other malaise: Secondary | ICD-10-CM | POA: Diagnosis not present

## 2020-07-01 LAB — BASIC METABOLIC PANEL
Anion gap: 13 (ref 5–15)
BUN: 8 mg/dL (ref 8–23)
CO2: 23 mmol/L (ref 22–32)
Calcium: 8.9 mg/dL (ref 8.9–10.3)
Chloride: 101 mmol/L (ref 98–111)
Creatinine, Ser: 0.72 mg/dL (ref 0.61–1.24)
GFR calc Af Amer: >60 mL/min (ref >60)
GFR calc non Af Amer: >60 mL/min (ref >60)
Glucose, Bld: 92 mg/dL (ref 70–99)
Potassium: 3.9 mmol/L (ref 3.5–5.1)
Sodium: 137 mmol/L (ref 135–145)

## 2020-07-01 LAB — CBC
HCT: 41 % (ref 39.0–52.0)
Hemoglobin: 14.1 g/dL (ref 13.0–17.0)
MCH: 33.4 pg (ref 26.0–34.0)
MCHC: 34.4 g/dL (ref 30.0–36.0)
MCV: 97.2 fL (ref 80.0–100.0)
Platelets: 201 10*3/uL (ref 150–400)
RBC: 4.22 MIL/uL (ref 4.22–5.81)
RDW: 13.2 % (ref 11.5–15.5)
WBC: 7.1 10*3/uL (ref 4.0–10.5)
nRBC: 0 % (ref 0.0–0.2)

## 2020-07-01 NOTE — ED Triage Notes (Signed)
Pt c/o continued numbness x 1 year. Pt states "I was so dizzy, more so than usual for the last few days".

## 2020-07-01 NOTE — ED Triage Notes (Signed)
Pt presents to ED via [facility] with c/o numbness. Per EMS pt with baseline numbess to R side of his head and mild confusion. Per EMS pt awoke this morning c/o worsening numbness to R side of his head. EMS reports caretaker at home states patient at baseline regarding confusion.    CBG 101 97.2 158/88 95% RA 63 Paced

## 2020-07-04 ENCOUNTER — Emergency Department: Payer: Medicare HMO

## 2020-07-04 ENCOUNTER — Emergency Department
Admission: EM | Admit: 2020-07-04 | Discharge: 2020-07-05 | Disposition: A | Discharge status: Home / Self Care | Payer: Medicare HMO | Attending: Emergency Medicine | Admitting: Emergency Medicine

## 2020-07-04 ENCOUNTER — Other Ambulatory Visit: Payer: Self-pay

## 2020-07-04 DIAGNOSIS — C4491 Basal cell carcinoma of skin, unspecified: Secondary | ICD-10-CM | POA: Diagnosis not present

## 2020-07-04 DIAGNOSIS — R0602 Shortness of breath: Secondary | ICD-10-CM | POA: Diagnosis not present

## 2020-07-04 DIAGNOSIS — R0789 Other chest pain: Secondary | ICD-10-CM | POA: Diagnosis not present

## 2020-07-04 DIAGNOSIS — Z7982 Long term (current) use of aspirin: Secondary | ICD-10-CM | POA: Diagnosis not present

## 2020-07-04 DIAGNOSIS — I959 Hypotension, unspecified: Secondary | ICD-10-CM | POA: Diagnosis not present

## 2020-07-04 DIAGNOSIS — I5022 Chronic systolic (congestive) heart failure: Secondary | ICD-10-CM | POA: Diagnosis not present

## 2020-07-04 DIAGNOSIS — I110 Hypertensive heart disease with heart failure: Secondary | ICD-10-CM | POA: Insufficient documentation

## 2020-07-04 DIAGNOSIS — R0902 Hypoxemia: Secondary | ICD-10-CM | POA: Diagnosis not present

## 2020-07-04 DIAGNOSIS — I2510 Atherosclerotic heart disease of native coronary artery without angina pectoris: Secondary | ICD-10-CM | POA: Diagnosis not present

## 2020-07-04 DIAGNOSIS — J45909 Unspecified asthma, uncomplicated: Secondary | ICD-10-CM | POA: Diagnosis not present

## 2020-07-04 DIAGNOSIS — R079 Chest pain, unspecified: Secondary | ICD-10-CM | POA: Diagnosis not present

## 2020-07-04 DIAGNOSIS — Z87891 Personal history of nicotine dependence: Secondary | ICD-10-CM | POA: Diagnosis not present

## 2020-07-04 DIAGNOSIS — Z79899 Other long term (current) drug therapy: Secondary | ICD-10-CM | POA: Insufficient documentation

## 2020-07-04 DIAGNOSIS — J449 Chronic obstructive pulmonary disease, unspecified: Secondary | ICD-10-CM | POA: Insufficient documentation

## 2020-07-04 DIAGNOSIS — J90 Pleural effusion, not elsewhere classified: Secondary | ICD-10-CM | POA: Diagnosis not present

## 2020-07-04 DIAGNOSIS — I517 Cardiomegaly: Secondary | ICD-10-CM | POA: Diagnosis not present

## 2020-07-04 DIAGNOSIS — R457 State of emotional shock and stress, unspecified: Secondary | ICD-10-CM | POA: Diagnosis not present

## 2020-07-04 LAB — BASIC METABOLIC PANEL
Anion gap: 11 (ref 5–15)
BUN: 11 mg/dL (ref 8–23)
CO2: 25 mmol/L (ref 22–32)
Calcium: 8.9 mg/dL (ref 8.9–10.3)
Chloride: 101 mmol/L (ref 98–111)
Creatinine, Ser: 0.94 mg/dL (ref 0.61–1.24)
GFR calc Af Amer: >60 mL/min (ref >60)
GFR calc non Af Amer: >60 mL/min (ref >60)
Glucose, Bld: 130 mg/dL — ABNORMAL HIGH (ref 70–99)
Potassium: 3.8 mmol/L (ref 3.5–5.1)
Sodium: 137 mmol/L (ref 135–145)

## 2020-07-04 LAB — CBC
HCT: 40.1 % (ref 39.0–52.0)
Hemoglobin: 14 g/dL (ref 13.0–17.0)
MCH: 33.9 pg (ref 26.0–34.0)
MCHC: 34.9 g/dL (ref 30.0–36.0)
MCV: 97.1 fL (ref 80.0–100.0)
Platelets: 190 10*3/uL (ref 150–400)
RBC: 4.13 MIL/uL — ABNORMAL LOW (ref 4.22–5.81)
RDW: 13.1 % (ref 11.5–15.5)
WBC: 7.2 10*3/uL (ref 4.0–10.5)
nRBC: 0 % (ref 0.0–0.2)

## 2020-07-04 LAB — BRAIN NATRIURETIC PEPTIDE: B Natriuretic Peptide: 148.5 pg/mL — ABNORMAL HIGH (ref 0.0–100.0)

## 2020-07-04 LAB — TROPONIN I (HIGH SENSITIVITY): Troponin I (High Sensitivity): 16 ng/L (ref <18)

## 2020-07-04 MED ORDER — ACETYLCYSTEINE 20 % IN SOLN
4.0000 mL | Freq: Once | RESPIRATORY_TRACT | Status: AC
  Administered 2020-07-04: 4 mL via RESPIRATORY_TRACT
  Filled 2020-07-04: qty 4

## 2020-07-04 NOTE — ED Notes (Signed)
Pt family in waiting room frustrated and yelling at security and staff.  Son has been apologized to for wait and situation, son continuing to interrupt nurse and threaten staff.  Son informed he would be informed when patient has a room and told he would need to wait outside.

## 2020-07-04 NOTE — Discharge Instructions (Signed)
Please seek medical attention for any high fevers, chest pain, shortness of breath, change in behavior, persistent vomiting, bloody stool or any other new or concerning symptoms.  

## 2020-07-04 NOTE — ED Triage Notes (Signed)
Pt comes EMS from home with shortness of breath. Pt states that his heart is weak and he feels like he can't breath. Pt able to talk in full sentences.

## 2020-07-04 NOTE — ED Triage Notes (Signed)
Pt in via EMS from home. Pt lives alone but has someone to sit with him. Pt took meds this am but meds laying around the house and may have taken some random meds. Pt hx of stroke and is WC bound but can stand with support. Pt came 2 days ago and left before being seen.

## 2020-07-04 NOTE — ED Notes (Signed)
Pt states coming in for shortness of breath. Pt states his heart is not working good enough to get the blood to the lungs to breath. Pts son at bedside. Pt on cardiac, bp and pulse ox monitor.

## 2020-07-04 NOTE — ED Provider Notes (Signed)
[facility] Emergency Department Provider Note  ____________________________________________   I have reviewed the triage vital signs and the nursing notes.   HISTORY  Chief Complaint Shortness of Breath   History limited by: Not Limited   HPI Darrell Jacobson is a 86 y.o. male who presents to the emergency department today with primary concern for shortness of breath.  It is hard to get a true timeline of her how long his shortness of breath is been getting worse but it sounds like it has been at least a few weeks.  Patient describes a sensation of not being able to get full oxygen into the center of his chest.  He does have a history of COPD and has been using his breathing medications at home with some improvement of his symptoms. Denies any fevers.   Records reviewed. Per medical record review patient has a history of COPD.   Past Medical History:  Diagnosis Date  . Arthritis   . Asthma    COPD with inhaler use  . Cancer [facility]) 2013   Basal Cell Skin CA resected from scalp.  . Carotid stenosis    pateint states he has Left carotid blockage worse than Right.   . CHF (congestive heart failure) ([place])   . Collagen vascular disease ([place])   . COPD (chronic obstructive pulmonary disease) ([place])   . Coronary artery disease   . Dyspnea   . GERD (gastroesophageal reflux disease)   . Glaucoma   . History of hiatal hernia   . History of shingles 2012   forehead  . Hypertension   . Multilevel degenerative disc disease   . Neuropathy    legs and feet  . Pacemaker 2006    Patient Active Problem List   Diagnosis Date Noted  . Right-sided chest pain   . Community acquired pneumonia 09/30/2019  . Essential hypertension 09/30/2019  . Chronic systolic CHF (congestive heart failure) ([place]) 09/30/2019  . Complete heart block ([place]) 09/30/2019  . History of cerebrovascular disease 09/30/2019  . PVD (peripheral vascular disease) ([place]) 09/30/2019  . Chronic  obstructive pulmonary disease ([place]) 09/30/2019  . Sepsis with acute renal failure without septic shock ([place])   . Lobar pneumonia ([place])   . Hyperlipidemia   . TIA (transient ischemic attack) 05/03/2019    Past Surgical History:  Procedure Laterality Date  . BACK SURGERY     2 lumbar, 1 thoracic  . CATARACT EXTRACTION W/PHACO Right 12/24/2016   Procedure: CATARACT EXTRACTION PHACO AND INTRAOCULAR LENS PLACEMENT ([place])  Right  complicated;  Surgeon: Leandrew Koyanagi, MD;  Location: [place];  Service: Ophthalmology;  Laterality: Right;  [place]  . JOINT REPLACEMENT Right    knee  . SKIN DEBRIDEMENT  06/13/2006   facial  . SKIN GRAFT Right 07/11/2016   facial    Prior to Admission medications   Medication Sig Start Date End Date Taking? Authorizing Provider  albuterol (PROAIR HFA) 108 (90 BASE) MCG/ACT inhaler Inhale 2 puffs into the lungs every 6 (six) hours as needed. 01/25/15 09/30/19  [provider]  aspirin EC 81 MG tablet Take 1 tablet by mouth daily.    [provider]  bimatoprost (LUMIGAN) 0.01 % SOLN Apply 1 Dose to eye at bedtime. 01/16/14   [provider]  BREO ELLIPTA 100-25 MCG/INH AEPB Inhale 1 puff into the lungs daily. 09/23/19   [provider]  Cholecalciferol (D 2000) 2000 UNITS TABS Take 1 tablet by mouth daily.  [provider]  doxazosin (CARDURA) 8 MG tablet Take 1 tablet by mouth at bedtime. 06/21/15   [provider]  feeding supplement, ENSURE ENLIVE, (ENSURE ENLIVE) LIQD Take 237 mLs by mouth 2 (two) times daily between meals. 10/02/19   Loletha Grayer, MD  Ferrous Sulfate (SLOW FE PO) Take by mouth.    [provider]  LORazepam (ATIVAN) 0.5 MG tablet Take 1 tablet (0.5 mg total) by mouth 2 (two) times daily as needed for anxiety. Patient taking differently: Take 0.5 mg by mouth 3 (three) times daily.  05/04/19   Hillary Bow, MD  montelukast (SINGULAIR) 10 MG tablet Take 1 tablet  by mouth at bedtime. 01/15/15   [provider]  Multiple Vitamin (MULTI-VITAMINS) TABS Take 1 tablet by mouth daily.    [provider]  nebivolol (BYSTOLIC) 2.5 MG tablet Take 1 tablet by mouth 3 (three) times daily. 06/26/15   [provider]  omeprazole (PRILOSEC) 20 MG capsule Take 1 capsule by mouth daily. 03/19/15   [provider]  prednisoLONE acetate (PRED FORTE) 1 % ophthalmic suspension Place 1 drop into the right eye daily. 12/31/18   [provider]  valACYclovir (VALTREX) 500 MG tablet Take 500 mg by mouth daily. 06/23/19   [provider]    Allergies Prednisone, Acyclovir, Albuterol sulfate, Amlodipine, Brinzolamide, Budesonide-formoterol fumarate, Buspirone, Cefdinir, Celecoxib, Chlorthalidone, Clopidogrel, Codeine, Diazepam, Dicyclomine, Duloxetine, Famciclovir, Felodipine, Furosemide, Gabapentin, Hydrochlorothiazide, Indomethacin, Lisinopril, Losartan, Meclizine, Meloxicam, Metaxalone, Metoprolol tartrate, Morphine, Nabumetone, Naproxen, Olmesartan, Oxycodone, Penicillin v potassium, Prasugrel, Promethazine, Sulfa antibiotics, Torsemide, Tramadol, and Zolpidem  History reviewed. No pertinent family history.  Social History Social History   Tobacco Use  . Smoking status: Former Smoker    Packs/day: 1.50    Years: 38.00    Pack years: 57.00    Types: Cigarettes    Quit date: 11/03/1984    Years since quitting: 35.6  . Smokeless tobacco: Never Used  Substance Use Topics  . Alcohol use: Yes    Alcohol/week: 14.0 standard drinks    Types: 14 Cans of beer per week  . Drug use: Not Currently    Review of Systems Constitutional: No fever/chills Eyes: No visual changes. ENT: No sore throat. Cardiovascular: Denies chest pain. Respiratory: Positive for shortness of breath.  Gastrointestinal: No abdominal pain.  No nausea, no vomiting.  No diarrhea.   Genitourinary: Negative for dysuria. Musculoskeletal: Negative for back  pain. Skin: Negative for rash. Neurological: Positive for chronic headache.  ____________________________________________   PHYSICAL EXAM:  VITAL SIGNS: ED Triage Vitals  Enc Vitals Group     BP 07/04/20 1143 136/62     Pulse Rate 07/04/20 1143 68     Resp 07/04/20 1143 19     Temp 07/04/20 1143 97.9 F (36.6 C)     Temp Source 07/04/20 1143 Oral     SpO2 07/04/20 1143 92 %     Weight 07/04/20 1129 178 lb 9.2 oz (81 kg)     Height 07/04/20 1129 '5\' 6"'  (1.676 m)     Head Circumference --      Peak Flow --      Pain Score 07/04/20 1129 6   Constitutional: Alert and oriented.  Eyes: Conjunctivae are normal.  ENT      Head: Normocephalic and atraumatic.      Nose: No congestion/rhinnorhea.      Mouth/Throat: Mucous membranes are moist.      Neck: No stridor. Hematological/Lymphatic/Immunilogical: No cervical lymphadenopathy. Cardiovascular: Normal rate, regular  rhythm.  No murmurs, rubs, or gallops.  Respiratory: Normal respiratory effort without tachypnea nor retractions. Breath sounds are clear and equal bilaterally. No wheezes/rales/rhonchi. Gastrointestinal: Soft and non tender. No rebound. No guarding.  Genitourinary: Deferred Musculoskeletal: Normal range of motion in all extremities. No lower extremity edema. Neurologic:  Normal speech and language. No gross focal neurologic deficits are appreciated.  Skin:  Skin is warm, dry and intact. No rash noted. Psychiatric: Mood and affect are normal. Speech and behavior are normal. Patient exhibits appropriate insight and judgment.  ____________________________________________    LABS (pertinent positives/negatives)  BNP 148.5 Trop hs 16 CBC wbc 7.2, hgb 14.0, plt 190 BMP wnl except glu 130  ____________________________________________   EKG  I, Nance Pear, attending physician, personally viewed and interpreted this EKG  EKG Time: 1131 Rate: 89 Rhythm: av dual paced rhythm Axis: left axis  deviation Intervals: qtc 520 QRS: wide ST changes: no st elevation equivalent Impression: abnormal ekg  ____________________________________________    RADIOLOGY  CXR No active disease  ____________________________________________   PROCEDURES  Procedures  ____________________________________________   INITIAL IMPRESSION / ASSESSMENT AND PLAN / ED COURSE  Pertinent labs & imaging results that were available during my care of the patient were reviewed by me and considered in my medical decision making (see chart for details).   Patient presented to the emergency department because of concern for shortness of breath. This appears to have been present for weeks. Patient has been using his inhalers with some benefit. CXR without concern for pneumonia or fluid overload. Patient has myriad allergies limiting therapeutic options. Will try mucomyst to see if that helps patient breath better. If improved do think it would be appropriate to discharge home.   ____________________________________________   FINAL CLINICAL IMPRESSION(S) / ED DIAGNOSES  Shortness of breath  Note: This dictation was prepared with Dragon dictation. Any transcriptional errors that result from this process are unintentional     Nance Pear, MD 07/04/20 2332

## 2020-07-05 NOTE — ED Provider Notes (Signed)
-----------------------------------------   11:11 PM on 07/04/2020 -----------------------------------------  Assuming care from Dr. Archie Balboa.  In short, Darrell Jacobson is a 86 y.o. male with a chief complaint of acute dyspnea thought to be secondary to COPD.  Refer to the original H&P for additional details.  The current plan of care is to reassess after breathing treatment.   ----------------------------------------- 12:12 AM on 07/05/2020 -----------------------------------------  Patient says he is feeling better after his breathing treatment.  He has numerous allergies which limits therapeutic options as per the documentation by Dr. Archie Balboa.  However his Mucomyst treatment seems to have helped.  He is breathing comfortably, not using accessory muscles, not retracting, moving good air with no wheezing, and satting 97 to 99% on room air.  His vital signs are stable including his blood pressure and heart rate.  I reviewed his lab work which is all within normal limits, verified that his high-sensitivity troponin of 16 is similar to prior values and that he is not having chest pain, and his BNP of 148.5 is nonspecific, minimally elevated, and his chest x-ray does not show any sign of pulmonary edema or pulmonary vascular congestion.  I discussed the results and his current status with the patient and he says he feels well enough to go home.  I gave my usual and customary return precautions verbally and provided the written instructions prepared by Dr. Archie Balboa.   Hinda Kehr, MD 07/05/20 [phone]

## 2020-07-10 DIAGNOSIS — I509 Heart failure, unspecified: Secondary | ICD-10-CM | POA: Diagnosis not present

## 2020-07-11 DIAGNOSIS — I25118 Atherosclerotic heart disease of native coronary artery with other forms of angina pectoris: Secondary | ICD-10-CM | POA: Diagnosis not present

## 2020-07-11 DIAGNOSIS — I5022 Chronic systolic (congestive) heart failure: Secondary | ICD-10-CM | POA: Diagnosis not present

## 2020-07-11 DIAGNOSIS — I10 Essential (primary) hypertension: Secondary | ICD-10-CM | POA: Diagnosis not present

## 2020-07-11 DIAGNOSIS — I6523 Occlusion and stenosis of bilateral carotid arteries: Secondary | ICD-10-CM | POA: Diagnosis not present

## 2020-07-12 ENCOUNTER — Encounter: Payer: Self-pay | Admitting: *Deleted

## 2020-07-12 DIAGNOSIS — H401131 Primary open-angle glaucoma, bilateral, mild stage: Secondary | ICD-10-CM | POA: Diagnosis not present

## 2020-07-12 DIAGNOSIS — I5022 Chronic systolic (congestive) heart failure: Secondary | ICD-10-CM

## 2020-07-12 NOTE — Progress Notes (Signed)
Cardiac Individual Treatment Plan  Patient Details  Name: Darrell Jacobson MRN: [id number] Date of Birth: 1928-03-24 Referring Provider:     Cardiac Rehab from 05/23/2020 in [facility] Cardiac and Pulmonary Rehab  Referring Provider Serafina Royals MD      Initial Encounter Date:    Cardiac Rehab from 05/23/2020 in [facility] Cardiac and Pulmonary Rehab  Date 05/23/20      Visit Diagnosis: Heart failure, chronic systolic ([place])  Patient's Home Medications on Admission:  Current Outpatient Medications:  .  albuterol (PROAIR HFA) 108 (90 BASE) MCG/ACT inhaler, Inhale 2 puffs into the lungs every 6 (six) hours as needed., Disp: , Rfl:  .  aspirin EC 81 MG tablet, Take 1 tablet by mouth daily., Disp: , Rfl:  .  bimatoprost (LUMIGAN) 0.01 % SOLN, Apply 1 Dose to eye at bedtime., Disp: , Rfl:  .  BREO ELLIPTA 100-25 MCG/INH AEPB, Inhale 1 puff into the lungs daily., Disp: , Rfl:  .  Cholecalciferol (D 2000) 2000 UNITS TABS, Take 1 tablet by mouth daily., Disp: , Rfl:  .  doxazosin (CARDURA) 8 MG tablet, Take 1 tablet by mouth at bedtime., Disp: , Rfl:  .  feeding supplement, ENSURE ENLIVE, (ENSURE ENLIVE) LIQD, Take 237 mLs by mouth 2 (two) times daily between meals., Disp: 14220 mL, Rfl: 0 .  Ferrous Sulfate (SLOW FE PO), Take by mouth., Disp: , Rfl:  .  LORazepam (ATIVAN) 0.5 MG tablet, Take 1 tablet (0.5 mg total) by mouth 2 (two) times daily as needed for anxiety. (Patient taking differently: Take 0.5 mg by mouth 3 (three) times daily. ), Disp: 10 tablet, Rfl: 0 .  montelukast (SINGULAIR) 10 MG tablet, Take 1 tablet by mouth at bedtime., Disp: , Rfl:  .  Multiple Vitamin (MULTI-VITAMINS) TABS, Take 1 tablet by mouth daily., Disp: , Rfl:  .  nebivolol (BYSTOLIC) 2.5 MG tablet, Take 1 tablet by mouth 3 (three) times daily., Disp: , Rfl:  .  omeprazole (PRILOSEC) 20 MG capsule, Take 1 capsule by mouth daily., Disp: , Rfl:  .  prednisoLONE acetate (PRED FORTE) 1 % ophthalmic suspension, Place 1 drop  into the right eye daily., Disp: , Rfl:  .  valACYclovir (VALTREX) 500 MG tablet, Take 500 mg by mouth daily., Disp: , Rfl:   Past Medical History: Past Medical History:  Diagnosis Date  . Arthritis   . Asthma    COPD with inhaler use  . Cancer [facility]) 2013   Basal Cell Skin CA resected from scalp.  . Carotid stenosis    pateint states he has Left carotid blockage worse than Right.   . CHF (congestive heart failure) ([place])   . Collagen vascular disease ([place])   . COPD (chronic obstructive pulmonary disease) ([place])   . Coronary artery disease   . Dyspnea   . GERD (gastroesophageal reflux disease)   . Glaucoma   . History of hiatal hernia   . History of shingles 2012   forehead  . Hypertension   . Multilevel degenerative disc disease   . Neuropathy    legs and feet  . Pacemaker 2006    Tobacco Use: Social History   Tobacco Use  Smoking Status Former Smoker  . Packs/day: 1.50  . Years: 38.00  . Pack years: 57.00  . Types: Cigarettes  . Quit date: 11/03/1984  . Years since quitting: 35.7  Smokeless Tobacco Never Used    Labs: Recent Review [facility] Cardiac and Pulmonary  Rehab Latest Ref Rng & Units 03/16/2014 05/03/2019   Cholestrol 0 - 200 mg/dL 180 150   LDLCALC 0 - 99 mg/dL 87 73   HDL >40 mg/dL 59 69   Trlycerides <150 mg/dL 169 42   Hemoglobin A1c 4.8 - 5.6 % - 5.2       Exercise Target Goals: Exercise Program Goal: Individual exercise prescription set using results from initial 6 min walk test and THRR while considering  patient's activity barriers and safety.   Exercise Prescription Goal: Initial exercise prescription builds to 30-45 minutes a day of aerobic activity, 2-3 days per week.  Home exercise guidelines will be given to patient during program as part of exercise prescription that the participant will acknowledge.   Education: Aerobic Exercise & Resistance Training: - Gives group verbal and written instruction on the various  components of exercise. Focuses on aerobic and resistive training programs and the benefits of this training and how to safely progress through these programs..   Education: Exercise & Equipment Safety: - Individual verbal instruction and demonstration of equipment use and safety with use of the equipment.   Cardiac Rehab from 05/23/2020 in [facility] Cardiac and Pulmonary Rehab  Date 05/14/20  Educator [facility]  Instruction Review Code 1- Verbalizes Understanding      Education: Exercise Physiology & General Exercise Guidelines: - Group verbal and written instruction with models to review the exercise physiology of the cardiovascular system and associated critical values. Provides general exercise guidelines with specific guidelines to those with heart or lung disease.    Education: Flexibility, Balance, Mind/Body Relaxation: Provides group verbal/written instruction on the benefits of flexibility and balance training, including mind/body exercise modes such as yoga, pilates and tai chi.  Demonstration and skill practice provided.   Activity Barriers & Risk Stratification:  Activity Barriers & Cardiac Risk Stratification - 05/23/20 1058      Activity Barriers & Cardiac Risk Stratification   Activity Barriers Arthritis;Right Knee Replacement;Joint Problems;Deconditioning;Muscular Weakness;History of Falls;Assistive Device;Balance Concerns;Other (comment)    Comments unsteady, uses wheelchair at home, better using walker, arth all over especially left knee, r leg weak from stroke and gives out, constant dizziness, blurry vision    Cardiac Risk Stratification High           6 Minute Walk:  6 Minute Walk    Row Name 05/23/20 1051         6 Minute Walk   Phase Initial  using walker with chair behind     Distance 145 feet     Walk Time 5.5 minutes     # of Rest Breaks 1  stopped with 30 sec left     MPH 0.3     RPE 13     Symptoms Yes (comment)     Comments R leg weak, knee give out,  dizziness, vision blurry     Resting HR 68 bpm     Resting BP 138/74     Resting Oxygen Saturation  95 %     Exercise Oxygen Saturation  during 6 min walk 96 %     Max Ex. HR 83 bpm     Max Ex. BP 164/84     2 Minute Post BP 146/62            Oxygen Initial Assessment:   Oxygen Re-Evaluation:   Oxygen Discharge (Final Oxygen Re-Evaluation):   Initial Exercise Prescription:  Initial Exercise Prescription - 05/23/20 1000      Date of Initial Exercise  RX and Referring Provider   Date 05/23/20    Referring Provider Serafina Royals MD      NuStep   Level 1    SPM 80    Minutes 30    METs 1.5      Biostep-RELP   Level 1    SPM 50    Minutes 15    METs 1      Prescription Details   Frequency (times per week) 2    Duration Progress to 30 minutes of continuous aerobic without signs/symptoms of physical distress      Intensity   THRR 40-80% of Max Heartrate 93-118    Ratings of Perceived Exertion 11-13    Perceived Dyspnea 0-4      Progression   Progression Continue to progress workloads to maintain intensity without signs/symptoms of physical distress.      Resistance Training   Training Prescription Yes    Weight 3 lb    Reps 10-15           Perform Capillary Blood Glucose checks as needed.  Exercise Prescription Changes:  Exercise Prescription Changes    Row Name 05/23/20 1000 05/29/20 1500           Response to Exercise   Blood Pressure (Admit) 138/74 136/74      Blood Pressure (Exercise) 164/84 148/76      Blood Pressure (Exit) 146/62 124/74      Heart Rate (Admit) 68 bpm 72 bpm      Heart Rate (Exercise) 83 bpm 115 bpm      Heart Rate (Exit) 72 bpm 83 bpm      Oxygen Saturation (Admit) 95 % --      Oxygen Saturation (Exercise) 96 % --      Rating of Perceived Exertion (Exercise) 13 12      Symptoms leg weakness, knee giving way, dizziness, blurry vision leg weakness, difficulty transferring      Comments walk test results first full day of  exercise      Duration -- Progress to 30 minutes of  aerobic without signs/symptoms of physical distress      Intensity -- THRR unchanged        Progression   Progression -- Continue to progress workloads to maintain intensity without signs/symptoms of physical distress.      Average METs -- 1.7        Resistance Training   Training Prescription -- Yes      Weight -- 3 lb      Reps -- 10-15        Interval Training   Interval Training -- No        NuStep   Level -- 1      Minutes -- 30      METs -- 1.7             Exercise Comments:  Exercise Comments    Row Name 05/29/20 1030           Exercise Comments First full day of exercise!  Patient was oriented to gym and equipment including functions, settings, policies, and procedures.  Patient's individual exercise prescription and treatment plan were reviewed.  All starting workloads were established based on the results of the 6 minute walk test done at initial orientation visit.  The plan for exercise progression was also introduced and progression will be customized based on patient's performance and goals.Mikki Santee was not strong enough to get out of the transport chair to  get on scales. He also required assistance to get on and off his exercise equipment.  Note sent to his PMD and referring MD to ask for outpatient Physical Therapy referral and treatment  before  Mikki Santee can return to Cardiac Rehab.  He must be self sufficient to get on and off equipment to attend the program.              Exercise Goals and Review:  Exercise Goals    Row Name 05/23/20 1113             Exercise Goals   Increase Physical Activity Yes       Intervention Provide advice, education, support and counseling about physical activity/exercise needs.;Develop an individualized exercise prescription for aerobic and resistive training based on initial evaluation findings, risk stratification, comorbidities and participant's personal goals.       Expected  Outcomes Short Term: Attend rehab on a regular basis to increase amount of physical activity.;Long Term: Add in home exercise to make exercise part of routine and to increase amount of physical activity.;Long Term: Exercising regularly at least 3-5 days a week.       Increase Strength and Stamina Yes       Intervention Provide advice, education, support and counseling about physical activity/exercise needs.;Develop an individualized exercise prescription for aerobic and resistive training based on initial evaluation findings, risk stratification, comorbidities and participant's personal goals.       Expected Outcomes Short Term: Increase workloads from initial exercise prescription for resistance, speed, and METs.;Short Term: Perform resistance training exercises routinely during rehab and add in resistance training at home;Long Term: Improve cardiorespiratory fitness, muscular endurance and strength as measured by increased METs and functional capacity (6MWT)       Able to understand and use rate of perceived exertion (RPE) scale Yes       Intervention Provide education and explanation on how to use RPE scale       Expected Outcomes Short Term: Able to use RPE daily in rehab to express subjective intensity level;Long Term:  Able to use RPE to guide intensity level when exercising independently       Able to understand and use Dyspnea scale Yes       Intervention Provide education and explanation on how to use Dyspnea scale       Expected Outcomes Short Term: Able to use Dyspnea scale daily in rehab to express subjective sense of shortness of breath during exertion;Long Term: Able to use Dyspnea scale to guide intensity level when exercising independently       Knowledge and understanding of Target Heart Rate Range (THRR) Yes       Intervention Provide education and explanation of THRR including how the numbers were predicted and where they are located for reference       Expected Outcomes Short Term:  Able to state/look up THRR;Short Term: Able to use daily as guideline for intensity in rehab;Long Term: Able to use THRR to govern intensity when exercising independently       Able to check pulse independently Yes       Intervention Provide education and demonstration on how to check pulse in carotid and radial arteries.;Review the importance of being able to check your own pulse for safety during independent exercise       Expected Outcomes Short Term: Able to explain why pulse checking is important during independent exercise;Long Term: Able to check pulse independently and accurately  Understanding of Exercise Prescription Yes       Intervention Provide education, explanation, and written materials on patient's individual exercise prescription       Expected Outcomes Short Term: Able to explain program exercise prescription;Long Term: Able to explain home exercise prescription to exercise independently              Exercise Goals Re-Evaluation :  Exercise Goals Re-Evaluation    Row Name 05/29/20 1030             Exercise Goal Re-Evaluation   Exercise Goals Review Able to understand and use rate of perceived exertion (RPE) scale;Knowledge and understanding of Target Heart Rate Range (THRR);Understanding of Exercise Prescription       Comments Reviewed RPE and dyspnea scales, THR and program prescription with pt today.  Pt voiced understanding and was given a copy of goals to take home.       Expected Outcomes Short: Use RPE daily to regulate intensity. Long: Follow program prescription in THR.              Discharge Exercise Prescription (Final Exercise Prescription Changes):  Exercise Prescription Changes - 05/29/20 1500      Response to Exercise   Blood Pressure (Admit) 136/74    Blood Pressure (Exercise) 148/76    Blood Pressure (Exit) 124/74    Heart Rate (Admit) 72 bpm    Heart Rate (Exercise) 115 bpm    Heart Rate (Exit) 83 bpm    Rating of Perceived Exertion  (Exercise) 12    Symptoms leg weakness, difficulty transferring    Comments first full day of exercise    Duration Progress to 30 minutes of  aerobic without signs/symptoms of physical distress    Intensity THRR unchanged      Progression   Progression Continue to progress workloads to maintain intensity without signs/symptoms of physical distress.    Average METs 1.7      Resistance Training   Training Prescription Yes    Weight 3 lb    Reps 10-15      Interval Training   Interval Training No      NuStep   Level 1    Minutes 30    METs 1.7           Nutrition:  Target Goals: Understanding of nutrition guidelines, daily intake of sodium <15107m, cholesterol <2047m calories 30% from fat and 7% or less from saturated fats, daily to have 5 or more servings of fruits and vegetables.  Education: Controlling Sodium/Reading Food Labels -Group verbal and written material supporting the discussion of sodium use in heart healthy nutrition. Review and explanation with models, verbal and written materials for utilization of the food label.   Education: General Nutrition Guidelines/Fats and Fiber: -Group instruction provided by verbal, written material, models and posters to present the general guidelines for heart healthy nutrition. Gives an explanation and review of dietary fats and fiber.   Biometrics:  Pre Biometrics - 05/23/20 1114      Pre Biometrics   Height _0  (1.676 m)   unable to stand unsupported   Weight 180 lb (81.6 kg)    BMI (Calculated) 29.07            Nutrition Therapy Plan and Nutrition Goals:   Nutrition Assessments:  Nutrition Assessments - 05/23/20 1115      MEDFICTS Scores   Pre Score 48           MEDIFICTS Score Key:          ?  70 Need to make dietary changes          40-70 Heart Healthy Diet         ? 40 Therapeutic Level Cholesterol Diet  Nutrition Goals Re-Evaluation:   Nutrition Goals Discharge (Final Nutrition Goals  Re-Evaluation):   Psychosocial: Target Goals: Acknowledge presence or absence of significant depression and/or stress, maximize coping skills, provide positive support system. Participant is able to verbalize types and ability to use techniques and skills needed for reducing stress and depression.   Education: Depression - Provides group verbal and written instruction on the correlation between heart/lung disease and depressed mood, treatment options, and the stigmas associated with seeking treatment.   Education: Sleep Hygiene -Provides group verbal and written instruction about how sleep can affect your health.  Define sleep hygiene, discuss sleep cycles and impact of sleep habits. Review good sleep hygiene tips.     Education: Stress and Anxiety: - Provides group verbal and written instruction about the health risks of elevated stress and causes of high stress.  Discuss the correlation between heart/lung disease and anxiety and treatment options. Review healthy ways to manage with stress and anxiety.    Initial Review & Psychosocial Screening:  Initial Psych Review & Screening - 05/14/20 1343      Initial Review   Current issues with Current Depression;History of Depression;Current Anxiety/Panic;Current Stress Concerns    Source of Stress Concerns Chronic Illness;Retirement/disability;Unable to perform yard/household activities    Comments Patient has had a stroke and is up in age and he said that comes with alot of anxiety. His shortness of breath makes him very anxious. He lives alone and states that a care taker come to the house to do house work for him.      Family Dynamics   Good Support System? Yes    Comments Waylyn cantalk to his son when he needs support.      Barriers   Psychosocial barriers to participate in program The patient should benefit from training in stress management and relaxation.      Screening Interventions   Interventions Encouraged to exercise;To  provide support and resources with identified psychosocial needs;Provide feedback about the scores to participant    Expected Outcomes Short Term goal: Utilizing psychosocial counselor, staff and physician to assist with identification of specific Stressors or current issues interfering with healing process. Setting desired goal for each stressor or current issue identified.;Long Term Goal: Stressors or current issues are controlled or eliminated.;Short Term goal: Identification and review with participant of any Quality of Life or Depression concerns found by scoring the questionnaire.;Long Term goal: The participant improves quality of Life and PHQ9 Scores as seen by post scores and/or verbalization of changes           Quality of Life Scores:   Quality of Life - 05/23/20 1114      Quality of Life   Select Quality of Life      Quality of Life Scores   Health/Function Pre 18.56 %    Socioeconomic Pre 28.8 %    Psych/Spiritual Pre 23.14 %    Family Pre 30 %    GLOBAL Pre 23 %          Scores of 19 and below usually indicate a poorer quality of life in these areas.  A difference of  2-3 points is a clinically meaningful difference.  A difference of 2-3 points in the total score of the Quality of Life Index has been associated with  significant improvement in overall quality of life, self-image, physical symptoms, and general health in studies assessing change in quality of life.  PHQ-9: Recent Review Flowsheet Data    Depression screen [facility] 2/9 05/23/2020   Decreased Interest 2   Down, Depressed, Hopeless 1   PHQ - 2 Score 3   Altered sleeping 0   Tired, decreased energy 1   Change in appetite 1   Feeling bad or failure about yourself  1   Trouble concentrating 0   Moving slowly or fidgety/restless 0   Suicidal thoughts 0   PHQ-9 Score 6   Difficult doing work/chores Somewhat difficult     Interpretation of Total Score  Total Score Depression Severity:  1-4 = Minimal  depression, 5-9 = Mild depression, 10-14 = Moderate depression, 15-19 = Moderately severe depression, 20-27 = Severe depression   Psychosocial Evaluation and Intervention:  Psychosocial Evaluation - 05/14/20 1346      Psychosocial Evaluation & Interventions   Interventions Encouraged to exercise with the program and follow exercise prescription    Comments Patient has had a stroke and is up in age and he said that comes with alot of anxiety. His shortness of breath makes him very anxious. He lives alone and states that a care taker come to the house to do house work for him.    Expected Outcomes Short: Exercise regularly to support mental health and notify staff of any changes. Long: maintain mental health and well being through teaching of rehab or prescribed medications independently.    Continue Psychosocial Services  Follow up required by staff           Psychosocial Re-Evaluation:   Psychosocial Discharge (Final Psychosocial Re-Evaluation):   Vocational Rehabilitation: Provide vocational rehab assistance to qualifying candidates.   Vocational Rehab Evaluation & Intervention:   Education: Education Goals: Education classes will be provided on a variety of topics geared toward better understanding of heart health and risk factor modification. Participant will state understanding/return demonstration of topics presented as noted by education test scores.  Learning Barriers/Preferences:  Learning Barriers/Preferences - 05/14/20 1343      Learning Barriers/Preferences   Learning Barriers Hearing;Sight;Exercise Concerns   History of CVA   Learning Preferences None           General Cardiac Education Topics:  AED/CPR: - Group verbal and written instruction with the use of models to demonstrate the basic use of the AED with the basic ABC's of resuscitation.   Anatomy & Physiology of the Heart: - Group verbal and written instruction and models provide basic cardiac anatomy  and physiology, with the coronary electrical and arterial systems. Review of Valvular disease and Heart Failure   Cardiac Procedures: - Group verbal and written instruction to review commonly prescribed medications for heart disease. Reviews the medication, class of the drug, and side effects. Includes the steps to properly store meds and maintain the prescription regimen. (beta blockers and nitrates)   Cardiac Medications I: - Group verbal and written instruction to review commonly prescribed medications for heart disease. Reviews the medication, class of the drug, and side effects. Includes the steps to properly store meds and maintain the prescription regimen.   Cardiac Medications II: -Group verbal and written instruction to review commonly prescribed medications for heart disease. Reviews the medication, class of the drug, and side effects. (all other drug classes)    Go Sex-Intimacy & Heart Disease, Get SMART - Goal Setting: - Group verbal and written instruction through game format  to discuss heart disease and the return to sexual intimacy. Provides group verbal and written material to discuss and apply goal setting through the application of the S.M.A.R.T. Method.   Other Matters of the Heart: - Provides group verbal, written materials and models to describe Stable Angina and Peripheral Artery. Includes description of the disease process and treatment options available to the cardiac patient.   Infection Prevention: - Provides verbal and written material to individual with discussion of infection control including proper hand washing and proper equipment cleaning during exercise session.   Cardiac Rehab from 05/23/2020 in [facility] Cardiac and Pulmonary Rehab  Date 05/14/20  Educator [facility]  Instruction Review Code 1- Verbalizes Understanding      Falls Prevention: - Provides verbal and written material to individual with discussion of falls prevention and safety.   Cardiac Rehab  from 05/23/2020 in [facility] Cardiac and Pulmonary Rehab  Date 05/14/20  Educator [facility]  Instruction Review Code 1- Verbalizes Understanding      Other: -Provides group and verbal instruction on various topics (see comments)   Knowledge Questionnaire Score:  Knowledge Questionnaire Score - 05/23/20 1115      Knowledge Questionnaire Score   Pre Score --   unable to complete          Core Components/Risk Factors/Patient Goals at Admission:  Personal Goals and Risk Factors at Admission - 05/23/20 1116      Core Components/Risk Factors/Patient Goals on Admission    Weight Management Yes;Weight Loss    Intervention Weight Management: Develop a combined nutrition and exercise program designed to reach desired caloric intake, while maintaining appropriate intake of nutrient and fiber, sodium and fats, and appropriate energy expenditure required for the weight goal.;Weight Management: Provide education and appropriate resources to help participant work on and attain dietary goals.    Admit Weight 180 lb (81.6 kg)    Goal Weight: Short Term 175 lb (79.4 kg)    Goal Weight: Long Term 170 lb (77.1 kg)    Expected Outcomes Short Term: Continue to assess and modify interventions until short term weight is achieved;Long Term: Adherence to nutrition and physical activity/exercise program aimed toward attainment of established weight goal;Weight Loss: Understanding of general recommendations for a balanced deficit meal plan, which promotes 1-2 lb weight loss per week and includes a negative energy balance of [phone] kcal/d;Understanding recommendations for meals to include 15-35% energy as protein, 25-35% energy from fat, 35-60% energy from carbohydrates, less than 252m of dietary cholesterol, 20-35 gm of total fiber daily;Understanding of distribution of calorie intake throughout the day with the consumption of 4-5 meals/snacks    Heart Failure Yes    Intervention Provide a combined exercise and nutrition  program that is supplemented with education, support and counseling about heart failure. Directed toward relieving symptoms such as shortness of breath, decreased exercise tolerance, and extremity edema.    Expected Outcomes Improve functional capacity of life;Short term: Attendance in program 2-3 days a week with increased exercise capacity. Reported lower sodium intake. Reported increased fruit and vegetable intake. Reports medication compliance.;Short term: Daily weights obtained and reported for increase. Utilizing diuretic protocols set by physician.;Long term: Adoption of self-care skills and reduction of barriers for early signs and symptoms recognition and intervention leading to self-care maintenance.    Hypertension Yes    Intervention Provide education on lifestyle modifcations including regular physical activity/exercise, weight management, moderate sodium restriction and increased consumption of fresh fruit, vegetables, and low fat dairy, alcohol moderation, and smoking cessation.;Monitor prescription  use compliance.    Expected Outcomes Short Term: Continued assessment and intervention until BP is < 140/44mm HG in hypertensive participants. < 130/73mm HG in hypertensive participants with diabetes, heart failure or chronic kidney disease.;Long Term: Maintenance of blood pressure at goal levels.    Lipids Yes    Intervention Provide education and support for participant on nutrition & aerobic/resistive exercise along with prescribed medications to achieve LDL '70mg'$ , HDL >$Remo'40mg'ajXsH$ .    Expected Outcomes Short Term: Participant states understanding of desired cholesterol values and is compliant with medications prescribed. Participant is following exercise prescription and nutrition guidelines.;Long Term: Cholesterol controlled with medications as prescribed, with individualized exercise RX and with personalized nutrition plan. Value goals: LDL < $Rem'70mg'WDCa$ , HDL > 40 mg.           Education:Diabetes -  Individual verbal and written instruction to review signs/symptoms of diabetes, desired ranges of glucose level fasting, after meals and with exercise. Acknowledge that pre and post exercise glucose checks will be done for 3 sessions at entry of program.   Education: Know Your Numbers and Risk Factors: -Group verbal and written instruction about important numbers in your health.  Discussion of what are risk factors and how they play a role in the disease process.  Review of Cholesterol, Blood Pressure, Diabetes, and BMI and the role they play in your overall health.   Core Components/Risk Factors/Patient Goals Review:    Core Components/Risk Factors/Patient Goals at Discharge (Final Review):    ITP Comments:  ITP Comments    Row Name 05/14/20 1347 05/23/20 1050 05/29/20 1029 06/13/20 0724 07/12/20 0748   ITP Comments Virtual Visit completed. Patient informed on EP and RD appointment and 6 Minute walk test. Patient also informed of patient health questionnaires on My Chart. Patient Verbalizes understanding. Visit diagnosis can be found in [facility] 04/30/2020. Completed 6MWT and gym orientation. Initial ITP created and sent for review to Dr. Emily Filbert, Medical Director. First full day of exercise!  Patient was oriented to gym and equipment including functions, settings, policies, and procedures.  Patient's individual exercise prescription and treatment plan were reviewed.  All starting workloads were established based on the results of the 6 minute walk test done at initial orientation visit.  The plan for exercise progression was also introduced and progression will be customized based on patient's performance and goals.Mikki Santee was not strong enough to get out of the transport chair to get on scales. He also required assistance to get on and off his exercise equipment.  Note sent to his PMD and referring MD to ask for outpatient Physical Therapy referral and treatment  before  Mikki Santee can return to Cardiac Rehab.   He must be self sufficient to get on and off equipment to attend the program. 30 Day review completed. Medical Director ITP review done, changes made as directed, and signed approval by Medical Director. 30 day review completed. ITP sent to Dr. Emily Filbert, Medical Director of Cardiac and Pulmonary Rehab. Continue with ITP unless changes are made by physician. Continues to need PT.          Comments: 30 day review

## 2020-07-20 DIAGNOSIS — J432 Centrilobular emphysema: Secondary | ICD-10-CM | POA: Diagnosis not present

## 2020-07-20 DIAGNOSIS — I509 Heart failure, unspecified: Secondary | ICD-10-CM | POA: Diagnosis not present

## 2020-07-20 DIAGNOSIS — R21 Rash and other nonspecific skin eruption: Secondary | ICD-10-CM | POA: Diagnosis not present

## 2020-08-06 DIAGNOSIS — R0902 Hypoxemia: Secondary | ICD-10-CM | POA: Diagnosis not present

## 2020-08-06 DIAGNOSIS — R0600 Dyspnea, unspecified: Secondary | ICD-10-CM | POA: Diagnosis not present

## 2020-08-08 ENCOUNTER — Encounter: Payer: Self-pay | Admitting: *Deleted

## 2020-08-08 DIAGNOSIS — I5022 Chronic systolic (congestive) heart failure: Secondary | ICD-10-CM

## 2020-08-08 NOTE — Progress Notes (Signed)
Cardiac Individual Treatment Plan  Patient Details  Name: Darrell Jacobson MRN: [id number] Date of Birth: 1927/12/06 Referring Provider:     Cardiac Rehab from 05/23/2020 in [facility] Cardiac and Pulmonary Rehab  Referring Provider Serafina Royals MD      Initial Encounter Date:    Cardiac Rehab from 05/23/2020 in [facility] Cardiac and Pulmonary Rehab  Date 05/23/20      Visit Diagnosis: Heart failure, chronic systolic ([place])  Patient's Home Medications on Admission:  Current Outpatient Medications:  .  albuterol (PROAIR HFA) 108 (90 BASE) MCG/ACT inhaler, Inhale 2 puffs into the lungs every 6 (six) hours as needed., Disp: , Rfl:  .  aspirin EC 81 MG tablet, Take 1 tablet by mouth daily., Disp: , Rfl:  .  bimatoprost (LUMIGAN) 0.01 % SOLN, Apply 1 Dose to eye at bedtime., Disp: , Rfl:  .  BREO ELLIPTA 100-25 MCG/INH AEPB, Inhale 1 puff into the lungs daily., Disp: , Rfl:  .  Cholecalciferol (D 2000) 2000 UNITS TABS, Take 1 tablet by mouth daily., Disp: , Rfl:  .  doxazosin (CARDURA) 8 MG tablet, Take 1 tablet by mouth at bedtime., Disp: , Rfl:  .  feeding supplement, ENSURE ENLIVE, (ENSURE ENLIVE) LIQD, Take 237 mLs by mouth 2 (two) times daily between meals., Disp: 14220 mL, Rfl: 0 .  Ferrous Sulfate (SLOW FE PO), Take by mouth., Disp: , Rfl:  .  LORazepam (ATIVAN) 0.5 MG tablet, Take 1 tablet (0.5 mg total) by mouth 2 (two) times daily as needed for anxiety. (Patient taking differently: Take 0.5 mg by mouth 3 (three) times daily. ), Disp: 10 tablet, Rfl: 0 .  montelukast (SINGULAIR) 10 MG tablet, Take 1 tablet by mouth at bedtime., Disp: , Rfl:  .  Multiple Vitamin (MULTI-VITAMINS) TABS, Take 1 tablet by mouth daily., Disp: , Rfl:  .  nebivolol (BYSTOLIC) 2.5 MG tablet, Take 1 tablet by mouth 3 (three) times daily., Disp: , Rfl:  .  omeprazole (PRILOSEC) 20 MG capsule, Take 1 capsule by mouth daily., Disp: , Rfl:  .  prednisoLONE acetate (PRED FORTE) 1 % ophthalmic suspension, Place 1 drop  into the right eye daily., Disp: , Rfl:  .  valACYclovir (VALTREX) 500 MG tablet, Take 500 mg by mouth daily., Disp: , Rfl:   Past Medical History: Past Medical History:  Diagnosis Date  . Arthritis   . Asthma    COPD with inhaler use  . Cancer [facility]) 2013   Basal Cell Skin CA resected from scalp.  . Carotid stenosis    pateint states he has Left carotid blockage worse than Right.   . CHF (congestive heart failure) ([place])   . Collagen vascular disease ([place])   . COPD (chronic obstructive pulmonary disease) ([place])   . Coronary artery disease   . Dyspnea   . GERD (gastroesophageal reflux disease)   . Glaucoma   . History of hiatal hernia   . History of shingles 2012   forehead  . Hypertension   . Multilevel degenerative disc disease   . Neuropathy    legs and feet  . Pacemaker 2006    Tobacco Use: Social History   Tobacco Use  Smoking Status Former Smoker  . Packs/day: 1.50  . Years: 38.00  . Pack years: 57.00  . Types: Cigarettes  . Quit date: 11/03/1984  . Years since quitting: 35.7  Smokeless Tobacco Never Used    Labs: Recent Review [facility] Cardiac and Pulmonary  Rehab Latest Ref Rng & Units 03/16/2014 05/03/2019   Cholestrol 0 - 200 mg/dL 180 150   LDLCALC 0 - 99 mg/dL 87 73   HDL >40 mg/dL 59 69   Trlycerides <150 mg/dL 169 42   Hemoglobin A1c 4.8 - 5.6 % - 5.2       Exercise Target Goals: Exercise Program Goal: Individual exercise prescription set using results from initial 6 min walk test and THRR while considering  patient's activity barriers and safety.   Exercise Prescription Goal: Initial exercise prescription builds to 30-45 minutes a day of aerobic activity, 2-3 days per week.  Home exercise guidelines will be given to patient during program as part of exercise prescription that the participant will acknowledge.   Education: Aerobic Exercise & Resistance Training: - Gives group verbal and written instruction on the various  components of exercise. Focuses on aerobic and resistive training programs and the benefits of this training and how to safely progress through these programs..   Education: Exercise & Equipment Safety: - Individual verbal instruction and demonstration of equipment use and safety with use of the equipment.   Cardiac Rehab from 05/23/2020 in [facility] Cardiac and Pulmonary Rehab  Date 05/14/20  Educator [facility]  Instruction Review Code 1- Verbalizes Understanding      Education: Exercise Physiology & General Exercise Guidelines: - Group verbal and written instruction with models to review the exercise physiology of the cardiovascular system and associated critical values. Provides general exercise guidelines with specific guidelines to those with heart or lung disease.    Education: Flexibility, Balance, Mind/Body Relaxation: Provides group verbal/written instruction on the benefits of flexibility and balance training, including mind/body exercise modes such as yoga, pilates and tai chi.  Demonstration and skill practice provided.   Activity Barriers & Risk Stratification:  Activity Barriers & Cardiac Risk Stratification - 05/23/20 1058      Activity Barriers & Cardiac Risk Stratification   Activity Barriers Arthritis;Right Knee Replacement;Joint Problems;Deconditioning;Muscular Weakness;History of Falls;Assistive Device;Balance Concerns;Other (comment)    Comments unsteady, uses wheelchair at home, better using walker, arth all over especially left knee, r leg weak from stroke and gives out, constant dizziness, blurry vision    Cardiac Risk Stratification High           6 Minute Walk:  6 Minute Walk    Row Name 05/23/20 1051         6 Minute Walk   Phase Initial  using walker with chair behind     Distance 145 feet     Walk Time 5.5 minutes     # of Rest Breaks 1  stopped with 30 sec left     MPH 0.3     RPE 13     Symptoms Yes (comment)     Comments R leg weak, knee give out,  dizziness, vision blurry     Resting HR 68 bpm     Resting BP 138/74     Resting Oxygen Saturation  95 %     Exercise Oxygen Saturation  during 6 min walk 96 %     Max Ex. HR 83 bpm     Max Ex. BP 164/84     2 Minute Post BP 146/62            Oxygen Initial Assessment:   Oxygen Re-Evaluation:   Oxygen Discharge (Final Oxygen Re-Evaluation):   Initial Exercise Prescription:  Initial Exercise Prescription - 05/23/20 1000      Date of Initial Exercise  RX and Referring Provider   Date 05/23/20    Referring Provider Serafina Royals MD      NuStep   Level 1    SPM 80    Minutes 30    METs 1.5      Biostep-RELP   Level 1    SPM 50    Minutes 15    METs 1      Prescription Details   Frequency (times per week) 2    Duration Progress to 30 minutes of continuous aerobic without signs/symptoms of physical distress      Intensity   THRR 40-80% of Max Heartrate 93-118    Ratings of Perceived Exertion 11-13    Perceived Dyspnea 0-4      Progression   Progression Continue to progress workloads to maintain intensity without signs/symptoms of physical distress.      Resistance Training   Training Prescription Yes    Weight 3 lb    Reps 10-15           Perform Capillary Blood Glucose checks as needed.  Exercise Prescription Changes:  Exercise Prescription Changes    Row Name 05/23/20 1000 05/29/20 1500           Response to Exercise   Blood Pressure (Admit) 138/74 136/74      Blood Pressure (Exercise) 164/84 148/76      Blood Pressure (Exit) 146/62 124/74      Heart Rate (Admit) 68 bpm 72 bpm      Heart Rate (Exercise) 83 bpm 115 bpm      Heart Rate (Exit) 72 bpm 83 bpm      Oxygen Saturation (Admit) 95 % --      Oxygen Saturation (Exercise) 96 % --      Rating of Perceived Exertion (Exercise) 13 12      Symptoms leg weakness, knee giving way, dizziness, blurry vision leg weakness, difficulty transferring      Comments walk test results first full day of  exercise      Duration -- Progress to 30 minutes of  aerobic without signs/symptoms of physical distress      Intensity -- THRR unchanged        Progression   Progression -- Continue to progress workloads to maintain intensity without signs/symptoms of physical distress.      Average METs -- 1.7        Resistance Training   Training Prescription -- Yes      Weight -- 3 lb      Reps -- 10-15        Interval Training   Interval Training -- No        NuStep   Level -- 1      Minutes -- 30      METs -- 1.7             Exercise Comments:  Exercise Comments    Row Name 05/29/20 1030           Exercise Comments First full day of exercise!  Patient was oriented to gym and equipment including functions, settings, policies, and procedures.  Patient's individual exercise prescription and treatment plan were reviewed.  All starting workloads were established based on the results of the 6 minute walk test done at initial orientation visit.  The plan for exercise progression was also introduced and progression will be customized based on patient's performance and goals.Mikki Santee was not strong enough to get out of the transport chair to  get on scales. He also required assistance to get on and off his exercise equipment.  Note sent to his PMD and referring MD to ask for outpatient Physical Therapy referral and treatment  before  Mikki Santee can return to Cardiac Rehab.  He must be self sufficient to get on and off equipment to attend the program.              Exercise Goals and Review:  Exercise Goals    Row Name 05/23/20 1113             Exercise Goals   Increase Physical Activity Yes       Intervention Provide advice, education, support and counseling about physical activity/exercise needs.;Develop an individualized exercise prescription for aerobic and resistive training based on initial evaluation findings, risk stratification, comorbidities and participant's personal goals.       Expected  Outcomes Short Term: Attend rehab on a regular basis to increase amount of physical activity.;Long Term: Add in home exercise to make exercise part of routine and to increase amount of physical activity.;Long Term: Exercising regularly at least 3-5 days a week.       Increase Strength and Stamina Yes       Intervention Provide advice, education, support and counseling about physical activity/exercise needs.;Develop an individualized exercise prescription for aerobic and resistive training based on initial evaluation findings, risk stratification, comorbidities and participant's personal goals.       Expected Outcomes Short Term: Increase workloads from initial exercise prescription for resistance, speed, and METs.;Short Term: Perform resistance training exercises routinely during rehab and add in resistance training at home;Long Term: Improve cardiorespiratory fitness, muscular endurance and strength as measured by increased METs and functional capacity (6MWT)       Able to understand and use rate of perceived exertion (RPE) scale Yes       Intervention Provide education and explanation on how to use RPE scale       Expected Outcomes Short Term: Able to use RPE daily in rehab to express subjective intensity level;Long Term:  Able to use RPE to guide intensity level when exercising independently       Able to understand and use Dyspnea scale Yes       Intervention Provide education and explanation on how to use Dyspnea scale       Expected Outcomes Short Term: Able to use Dyspnea scale daily in rehab to express subjective sense of shortness of breath during exertion;Long Term: Able to use Dyspnea scale to guide intensity level when exercising independently       Knowledge and understanding of Target Heart Rate Range (THRR) Yes       Intervention Provide education and explanation of THRR including how the numbers were predicted and where they are located for reference       Expected Outcomes Short Term:  Able to state/look up THRR;Short Term: Able to use daily as guideline for intensity in rehab;Long Term: Able to use THRR to govern intensity when exercising independently       Able to check pulse independently Yes       Intervention Provide education and demonstration on how to check pulse in carotid and radial arteries.;Review the importance of being able to check your own pulse for safety during independent exercise       Expected Outcomes Short Term: Able to explain why pulse checking is important during independent exercise;Long Term: Able to check pulse independently and accurately  Understanding of Exercise Prescription Yes       Intervention Provide education, explanation, and written materials on patient's individual exercise prescription       Expected Outcomes Short Term: Able to explain program exercise prescription;Long Term: Able to explain home exercise prescription to exercise independently              Exercise Goals Re-Evaluation :  Exercise Goals Re-Evaluation    Row Name 05/29/20 1030             Exercise Goal Re-Evaluation   Exercise Goals Review Able to understand and use rate of perceived exertion (RPE) scale;Knowledge and understanding of Target Heart Rate Range (THRR);Understanding of Exercise Prescription       Comments Reviewed RPE and dyspnea scales, THR and program prescription with pt today.  Pt voiced understanding and was given a copy of goals to take home.       Expected Outcomes Short: Use RPE daily to regulate intensity. Long: Follow program prescription in THR.              Discharge Exercise Prescription (Final Exercise Prescription Changes):  Exercise Prescription Changes - 05/29/20 1500      Response to Exercise   Blood Pressure (Admit) 136/74    Blood Pressure (Exercise) 148/76    Blood Pressure (Exit) 124/74    Heart Rate (Admit) 72 bpm    Heart Rate (Exercise) 115 bpm    Heart Rate (Exit) 83 bpm    Rating of Perceived Exertion  (Exercise) 12    Symptoms leg weakness, difficulty transferring    Comments first full day of exercise    Duration Progress to 30 minutes of  aerobic without signs/symptoms of physical distress    Intensity THRR unchanged      Progression   Progression Continue to progress workloads to maintain intensity without signs/symptoms of physical distress.    Average METs 1.7      Resistance Training   Training Prescription Yes    Weight 3 lb    Reps 10-15      Interval Training   Interval Training No      NuStep   Level 1    Minutes 30    METs 1.7           Nutrition:  Target Goals: Understanding of nutrition guidelines, daily intake of sodium <15107m, cholesterol <2047m calories 30% from fat and 7% or less from saturated fats, daily to have 5 or more servings of fruits and vegetables.  Education: Controlling Sodium/Reading Food Labels -Group verbal and written material supporting the discussion of sodium use in heart healthy nutrition. Review and explanation with models, verbal and written materials for utilization of the food label.   Education: General Nutrition Guidelines/Fats and Fiber: -Group instruction provided by verbal, written material, models and posters to present the general guidelines for heart healthy nutrition. Gives an explanation and review of dietary fats and fiber.   Biometrics:  Pre Biometrics - 05/23/20 1114      Pre Biometrics   Height _0  (1.676 m)   unable to stand unsupported   Weight 180 lb (81.6 kg)    BMI (Calculated) 29.07            Nutrition Therapy Plan and Nutrition Goals:   Nutrition Assessments:  Nutrition Assessments - 05/23/20 1115      MEDFICTS Scores   Pre Score 48           MEDIFICTS Score Key:          ?  70 Need to make dietary changes          40-70 Heart Healthy Diet         ? 40 Therapeutic Level Cholesterol Diet  Nutrition Goals Re-Evaluation:   Nutrition Goals Discharge (Final Nutrition Goals  Re-Evaluation):   Psychosocial: Target Goals: Acknowledge presence or absence of significant depression and/or stress, maximize coping skills, provide positive support system. Participant is able to verbalize types and ability to use techniques and skills needed for reducing stress and depression.   Education: Depression - Provides group verbal and written instruction on the correlation between heart/lung disease and depressed mood, treatment options, and the stigmas associated with seeking treatment.   Education: Sleep Hygiene -Provides group verbal and written instruction about how sleep can affect your health.  Define sleep hygiene, discuss sleep cycles and impact of sleep habits. Review good sleep hygiene tips.     Education: Stress and Anxiety: - Provides group verbal and written instruction about the health risks of elevated stress and causes of high stress.  Discuss the correlation between heart/lung disease and anxiety and treatment options. Review healthy ways to manage with stress and anxiety.    Initial Review & Psychosocial Screening:  Initial Psych Review & Screening - 05/14/20 1343      Initial Review   Current issues with Current Depression;History of Depression;Current Anxiety/Panic;Current Stress Concerns    Source of Stress Concerns Chronic Illness;Retirement/disability;Unable to perform yard/household activities    Comments Patient has had a stroke and is up in age and he said that comes with alot of anxiety. His shortness of breath makes him very anxious. He lives alone and states that a care taker come to the house to do house work for him.      Family Dynamics   Good Support System? Yes    Comments Waylyn cantalk to his son when he needs support.      Barriers   Psychosocial barriers to participate in program The patient should benefit from training in stress management and relaxation.      Screening Interventions   Interventions Encouraged to exercise;To  provide support and resources with identified psychosocial needs;Provide feedback about the scores to participant    Expected Outcomes Short Term goal: Utilizing psychosocial counselor, staff and physician to assist with identification of specific Stressors or current issues interfering with healing process. Setting desired goal for each stressor or current issue identified.;Long Term Goal: Stressors or current issues are controlled or eliminated.;Short Term goal: Identification and review with participant of any Quality of Life or Depression concerns found by scoring the questionnaire.;Long Term goal: The participant improves quality of Life and PHQ9 Scores as seen by post scores and/or verbalization of changes           Quality of Life Scores:   Quality of Life - 05/23/20 1114      Quality of Life   Select Quality of Life      Quality of Life Scores   Health/Function Pre 18.56 %    Socioeconomic Pre 28.8 %    Psych/Spiritual Pre 23.14 %    Family Pre 30 %    GLOBAL Pre 23 %          Scores of 19 and below usually indicate a poorer quality of life in these areas.  A difference of  2-3 points is a clinically meaningful difference.  A difference of 2-3 points in the total score of the Quality of Life Index has been associated with  significant improvement in overall quality of life, self-image, physical symptoms, and general health in studies assessing change in quality of life.  PHQ-9: Recent Review Flowsheet Data    Depression screen [facility] 2/9 05/23/2020   Decreased Interest 2   Down, Depressed, Hopeless 1   PHQ - 2 Score 3   Altered sleeping 0   Tired, decreased energy 1   Change in appetite 1   Feeling bad or failure about yourself  1   Trouble concentrating 0   Moving slowly or fidgety/restless 0   Suicidal thoughts 0   PHQ-9 Score 6   Difficult doing work/chores Somewhat difficult     Interpretation of Total Score  Total Score Depression Severity:  1-4 = Minimal  depression, 5-9 = Mild depression, 10-14 = Moderate depression, 15-19 = Moderately severe depression, 20-27 = Severe depression   Psychosocial Evaluation and Intervention:  Psychosocial Evaluation - 05/14/20 1346      Psychosocial Evaluation & Interventions   Interventions Encouraged to exercise with the program and follow exercise prescription    Comments Patient has had a stroke and is up in age and he said that comes with alot of anxiety. His shortness of breath makes him very anxious. He lives alone and states that a care taker come to the house to do house work for him.    Expected Outcomes Short: Exercise regularly to support mental health and notify staff of any changes. Long: maintain mental health and well being through teaching of rehab or prescribed medications independently.    Continue Psychosocial Services  Follow up required by staff           Psychosocial Re-Evaluation:   Psychosocial Discharge (Final Psychosocial Re-Evaluation):   Vocational Rehabilitation: Provide vocational rehab assistance to qualifying candidates.   Vocational Rehab Evaluation & Intervention:   Education: Education Goals: Education classes will be provided on a variety of topics geared toward better understanding of heart health and risk factor modification. Participant will state understanding/return demonstration of topics presented as noted by education test scores.  Learning Barriers/Preferences:  Learning Barriers/Preferences - 05/14/20 1343      Learning Barriers/Preferences   Learning Barriers Hearing;Sight;Exercise Concerns   History of CVA   Learning Preferences None           General Cardiac Education Topics:  AED/CPR: - Group verbal and written instruction with the use of models to demonstrate the basic use of the AED with the basic ABC's of resuscitation.   Anatomy & Physiology of the Heart: - Group verbal and written instruction and models provide basic cardiac anatomy  and physiology, with the coronary electrical and arterial systems. Review of Valvular disease and Heart Failure   Cardiac Procedures: - Group verbal and written instruction to review commonly prescribed medications for heart disease. Reviews the medication, class of the drug, and side effects. Includes the steps to properly store meds and maintain the prescription regimen. (beta blockers and nitrates)   Cardiac Medications I: - Group verbal and written instruction to review commonly prescribed medications for heart disease. Reviews the medication, class of the drug, and side effects. Includes the steps to properly store meds and maintain the prescription regimen.   Cardiac Medications II: -Group verbal and written instruction to review commonly prescribed medications for heart disease. Reviews the medication, class of the drug, and side effects. (all other drug classes)    Go Sex-Intimacy & Heart Disease, Get SMART - Goal Setting: - Group verbal and written instruction through game format  to discuss heart disease and the return to sexual intimacy. Provides group verbal and written material to discuss and apply goal setting through the application of the S.M.A.R.T. Method.   Other Matters of the Heart: - Provides group verbal, written materials and models to describe Stable Angina and Peripheral Artery. Includes description of the disease process and treatment options available to the cardiac patient.   Infection Prevention: - Provides verbal and written material to individual with discussion of infection control including proper hand washing and proper equipment cleaning during exercise session.   Cardiac Rehab from 05/23/2020 in [facility] Cardiac and Pulmonary Rehab  Date 05/14/20  Educator [facility]  Instruction Review Code 1- Verbalizes Understanding      Falls Prevention: - Provides verbal and written material to individual with discussion of falls prevention and safety.   Cardiac Rehab  from 05/23/2020 in [facility] Cardiac and Pulmonary Rehab  Date 05/14/20  Educator [facility]  Instruction Review Code 1- Verbalizes Understanding      Other: -Provides group and verbal instruction on various topics (see comments)   Knowledge Questionnaire Score:  Knowledge Questionnaire Score - 05/23/20 1115      Knowledge Questionnaire Score   Pre Score --   unable to complete          Core Components/Risk Factors/Patient Goals at Admission:  Personal Goals and Risk Factors at Admission - 05/23/20 1116      Core Components/Risk Factors/Patient Goals on Admission    Weight Management Yes;Weight Loss    Intervention Weight Management: Develop a combined nutrition and exercise program designed to reach desired caloric intake, while maintaining appropriate intake of nutrient and fiber, sodium and fats, and appropriate energy expenditure required for the weight goal.;Weight Management: Provide education and appropriate resources to help participant work on and attain dietary goals.    Admit Weight 180 lb (81.6 kg)    Goal Weight: Short Term 175 lb (79.4 kg)    Goal Weight: Long Term 170 lb (77.1 kg)    Expected Outcomes Short Term: Continue to assess and modify interventions until short term weight is achieved;Long Term: Adherence to nutrition and physical activity/exercise program aimed toward attainment of established weight goal;Weight Loss: Understanding of general recommendations for a balanced deficit meal plan, which promotes 1-2 lb weight loss per week and includes a negative energy balance of [phone] kcal/d;Understanding recommendations for meals to include 15-35% energy as protein, 25-35% energy from fat, 35-60% energy from carbohydrates, less than 252m of dietary cholesterol, 20-35 gm of total fiber daily;Understanding of distribution of calorie intake throughout the day with the consumption of 4-5 meals/snacks    Heart Failure Yes    Intervention Provide a combined exercise and nutrition  program that is supplemented with education, support and counseling about heart failure. Directed toward relieving symptoms such as shortness of breath, decreased exercise tolerance, and extremity edema.    Expected Outcomes Improve functional capacity of life;Short term: Attendance in program 2-3 days a week with increased exercise capacity. Reported lower sodium intake. Reported increased fruit and vegetable intake. Reports medication compliance.;Short term: Daily weights obtained and reported for increase. Utilizing diuretic protocols set by physician.;Long term: Adoption of self-care skills and reduction of barriers for early signs and symptoms recognition and intervention leading to self-care maintenance.    Hypertension Yes    Intervention Provide education on lifestyle modifcations including regular physical activity/exercise, weight management, moderate sodium restriction and increased consumption of fresh fruit, vegetables, and low fat dairy, alcohol moderation, and smoking cessation.;Monitor prescription  use compliance.    Expected Outcomes Short Term: Continued assessment and intervention until BP is < 140/64m HG in hypertensive participants. < 130/875mHG in hypertensive participants with diabetes, heart failure or chronic kidney disease.;Long Term: Maintenance of blood pressure at goal levels.    Lipids Yes    Intervention Provide education and support for participant on nutrition & aerobic/resistive exercise along with prescribed medications to achieve LDL <7029mHDL >45m22m  Expected Outcomes Short Term: Participant states understanding of desired cholesterol values and is compliant with medications prescribed. Participant is following exercise prescription and nutrition guidelines.;Long Term: Cholesterol controlled with medications as prescribed, with individualized exercise RX and with personalized nutrition plan. Value goals: LDL < 70mg53mL > 40 mg.           Education:Diabetes -  Individual verbal and written instruction to review signs/symptoms of diabetes, desired ranges of glucose level fasting, after meals and with exercise. Acknowledge that pre and post exercise glucose checks will be done for 3 sessions at entry of program.   Education: Know Your Numbers and Risk Factors: -Group verbal and written instruction about important numbers in your health.  Discussion of what are risk factors and how they play a role in the disease process.  Review of Cholesterol, Blood Pressure, Diabetes, and BMI and the role they play in your overall health.   Core Components/Risk Factors/Patient Goals Review:    Core Components/Risk Factors/Patient Goals at Discharge (Final Review):    ITP Comments:  ITP Comments    Row Name 05/14/20 1347 05/23/20 1050 05/29/20 1029 06/13/20 0724 07/12/20 0748   ITP Comments Virtual Visit completed. Patient informed on EP and RD appointment and 6 Minute walk test. Patient also informed of patient health questionnaires on My Chart. Patient Verbalizes understanding. Visit diagnosis can be found in [facility]. Completed 6MWT and gym orientation. Initial ITP created and sent for review to Dr. Mark Emily Filbertical Director. First full day of exercise!  Patient was oriented to gym and equipment including functions, settings, policies, and procedures.  Patient's individual exercise prescription and treatment plan were reviewed.  All starting workloads were established based on the results of the 6 minute walk test done at initial orientation visit.  The plan for exercise progression was also introduced and progression will be customized based on patient's performance and goals.Bob wMikki Santeenot strong enough to get out of the transport chair to get on scales. He also required assistance to get on and off his exercise equipment.  Note sent to his PMD and referring MD to ask for outpatient Physical Therapy referral and treatment  before  Bob cMikki Santeereturn to Cardiac Rehab.   He must be self sufficient to get on and off equipment to attend the program. 30 Day review completed. Medical Director ITP review done, changes made as directed, and signed approval by Medical Director. 30 day review completed. ITP sent to Dr. Mark Emily Filbertical Director of Cardiac and Pulmonary Rehab. Continue with ITP unless changes are made by physician. Continues to need PT.   [place] 08/08/20 0622           ITP Comments 30 Day review completed. Medical Director ITP review done, changes made as directed, and signed approval by Medical Director.              Comments:

## 2020-08-16 DIAGNOSIS — Z85828 Personal history of other malignant neoplasm of skin: Secondary | ICD-10-CM | POA: Diagnosis not present

## 2020-08-16 DIAGNOSIS — R519 Headache, unspecified: Secondary | ICD-10-CM | POA: Diagnosis not present

## 2020-08-16 DIAGNOSIS — J432 Centrilobular emphysema: Secondary | ICD-10-CM | POA: Diagnosis not present

## 2020-08-16 DIAGNOSIS — F419 Anxiety disorder, unspecified: Secondary | ICD-10-CM | POA: Diagnosis not present

## 2020-08-21 ENCOUNTER — Encounter: Payer: Self-pay | Admitting: *Deleted

## 2020-08-21 DIAGNOSIS — I5022 Chronic systolic (congestive) heart failure: Secondary | ICD-10-CM

## 2020-08-21 NOTE — Progress Notes (Signed)
Cardiac Individual Treatment Plan  Patient Details  Name: Darrell Jacobson MRN: [id number] Date of Birth: 27-May-1928 Referring Provider:     Cardiac Rehab from 05/23/2020 in [facility] Cardiac and Pulmonary Rehab  Referring Provider Serafina Royals MD      Initial Encounter Date:    Cardiac Rehab from 05/23/2020 in [facility] Cardiac and Pulmonary Rehab  Date 05/23/20      Visit Diagnosis: Heart failure, chronic systolic ([place])  Patient's Home Medications on Admission:  Current Outpatient Medications:  .  albuterol (PROAIR HFA) 108 (90 BASE) MCG/ACT inhaler, Inhale 2 puffs into the lungs every 6 (six) hours as needed., Disp: , Rfl:  .  aspirin EC 81 MG tablet, Take 1 tablet by mouth daily., Disp: , Rfl:  .  bimatoprost (LUMIGAN) 0.01 % SOLN, Apply 1 Dose to eye at bedtime., Disp: , Rfl:  .  BREO ELLIPTA 100-25 MCG/INH AEPB, Inhale 1 puff into the lungs daily., Disp: , Rfl:  .  Cholecalciferol (D 2000) 2000 UNITS TABS, Take 1 tablet by mouth daily., Disp: , Rfl:  .  doxazosin (CARDURA) 8 MG tablet, Take 1 tablet by mouth at bedtime., Disp: , Rfl:  .  feeding supplement, ENSURE ENLIVE, (ENSURE ENLIVE) LIQD, Take 237 mLs by mouth 2 (two) times daily between meals., Disp: 14220 mL, Rfl: 0 .  Ferrous Sulfate (SLOW FE PO), Take by mouth., Disp: , Rfl:  .  LORazepam (ATIVAN) 0.5 MG tablet, Take 1 tablet (0.5 mg total) by mouth 2 (two) times daily as needed for anxiety. (Patient taking differently: Take 0.5 mg by mouth 3 (three) times daily. ), Disp: 10 tablet, Rfl: 0 .  montelukast (SINGULAIR) 10 MG tablet, Take 1 tablet by mouth at bedtime., Disp: , Rfl:  .  Multiple Vitamin (MULTI-VITAMINS) TABS, Take 1 tablet by mouth daily., Disp: , Rfl:  .  nebivolol (BYSTOLIC) 2.5 MG tablet, Take 1 tablet by mouth 3 (three) times daily., Disp: , Rfl:  .  omeprazole (PRILOSEC) 20 MG capsule, Take 1 capsule by mouth daily., Disp: , Rfl:  .  prednisoLONE acetate (PRED FORTE) 1 % ophthalmic suspension, Place 1 drop  into the right eye daily., Disp: , Rfl:  .  valACYclovir (VALTREX) 500 MG tablet, Take 500 mg by mouth daily., Disp: , Rfl:   Past Medical History: Past Medical History:  Diagnosis Date  . Arthritis   . Asthma    COPD with inhaler use  . Cancer [facility]) 2013   Basal Cell Skin CA resected from scalp.  . Carotid stenosis    pateint states he has Left carotid blockage worse than Right.   . CHF (congestive heart failure) ([place])   . Collagen vascular disease ([place])   . COPD (chronic obstructive pulmonary disease) ([place])   . Coronary artery disease   . Dyspnea   . GERD (gastroesophageal reflux disease)   . Glaucoma   . History of hiatal hernia   . History of shingles 2012   forehead  . Hypertension   . Multilevel degenerative disc disease   . Neuropathy    legs and feet  . Pacemaker 2006    Tobacco Use: Social History   Tobacco Use  Smoking Status Former Smoker  . Packs/day: 1.50  . Years: 38.00  . Pack years: 57.00  . Types: Cigarettes  . Quit date: 11/03/1984  . Years since quitting: 35.8  Smokeless Tobacco Never Used    Labs: Recent Review Scientist, physiological    Labs for ITP Cardiac and Pulmonary  Rehab Latest Ref Rng & Units 03/16/2014 05/03/2019   Cholestrol 0 - 200 mg/dL 180 150   LDLCALC 0 - 99 mg/dL 87 73   HDL >40 mg/dL 59 69   Trlycerides <150 mg/dL 169 42   Hemoglobin A1c 4.8 - 5.6 % - 5.2       Exercise Target Goals: Exercise Program Goal: Individual exercise prescription set using results from initial 6 min walk test and THRR while considering  patient's activity barriers and safety.   Exercise Prescription Goal: Initial exercise prescription builds to 30-45 minutes a day of aerobic activity, 2-3 days per week.  Home exercise guidelines will be given to patient during program as part of exercise prescription that the participant will acknowledge.   Education: Aerobic Exercise & Resistance Training: - Gives group verbal and written instruction on the various  components of exercise. Focuses on aerobic and resistive training programs and the benefits of this training and how to safely progress through these programs..   Education: Exercise & Equipment Safety: - Individual verbal instruction and demonstration of equipment use and safety with use of the equipment.   Cardiac Rehab from 05/23/2020 in [facility] Cardiac and Pulmonary Rehab  Date 05/14/20  Educator [facility]  Instruction Review Code 1- Verbalizes Understanding      Education: Exercise Physiology & General Exercise Guidelines: - Group verbal and written instruction with models to review the exercise physiology of the cardiovascular system and associated critical values. Provides general exercise guidelines with specific guidelines to those with heart or lung disease.    Education: Flexibility, Balance, Mind/Body Relaxation: Provides group verbal/written instruction on the benefits of flexibility and balance training, including mind/body exercise modes such as yoga, pilates and tai chi.  Demonstration and skill practice provided.   Activity Barriers & Risk Stratification:  Activity Barriers & Cardiac Risk Stratification - 05/23/20 1058      Activity Barriers & Cardiac Risk Stratification   Activity Barriers Arthritis;Right Knee Replacement;Joint Problems;Deconditioning;Muscular Weakness;History of Falls;Assistive Device;Balance Concerns;Other (comment)    Comments unsteady, uses wheelchair at home, better using walker, arth all over especially left knee, r leg weak from stroke and gives out, constant dizziness, blurry vision    Cardiac Risk Stratification High           6 Minute Walk:  6 Minute Walk    Row Name 05/23/20 1051         6 Minute Walk   Phase Initial  using walker with chair behind     Distance 145 feet     Walk Time 5.5 minutes     # of Rest Breaks 1  stopped with 30 sec left     MPH 0.3     RPE 13     Symptoms Yes (comment)     Comments R leg weak, knee give out,  dizziness, vision blurry     Resting HR 68 bpm     Resting BP 138/74     Resting Oxygen Saturation  95 %     Exercise Oxygen Saturation  during 6 min walk 96 %     Max Ex. HR 83 bpm     Max Ex. BP 164/84     2 Minute Post BP 146/62            Oxygen Initial Assessment:   Oxygen Re-Evaluation:   Oxygen Discharge (Final Oxygen Re-Evaluation):   Initial Exercise Prescription:  Initial Exercise Prescription - 05/23/20 1000      Date of Initial Exercise  RX and Referring Provider   Date 05/23/20    Referring Provider Serafina Royals MD      NuStep   Level 1    SPM 80    Minutes 30    METs 1.5      Biostep-RELP   Level 1    SPM 50    Minutes 15    METs 1      Prescription Details   Frequency (times per week) 2    Duration Progress to 30 minutes of continuous aerobic without signs/symptoms of physical distress      Intensity   THRR 40-80% of Max Heartrate 93-118    Ratings of Perceived Exertion 11-13    Perceived Dyspnea 0-4      Progression   Progression Continue to progress workloads to maintain intensity without signs/symptoms of physical distress.      Resistance Training   Training Prescription Yes    Weight 3 lb    Reps 10-15           Perform Capillary Blood Glucose checks as needed.  Exercise Prescription Changes:  Exercise Prescription Changes    Row Name 05/23/20 1000 05/29/20 1500           Response to Exercise   Blood Pressure (Admit) 138/74 136/74      Blood Pressure (Exercise) 164/84 148/76      Blood Pressure (Exit) 146/62 124/74      Heart Rate (Admit) 68 bpm 72 bpm      Heart Rate (Exercise) 83 bpm 115 bpm      Heart Rate (Exit) 72 bpm 83 bpm      Oxygen Saturation (Admit) 95 % --      Oxygen Saturation (Exercise) 96 % --      Rating of Perceived Exertion (Exercise) 13 12      Symptoms leg weakness, knee giving way, dizziness, blurry vision leg weakness, difficulty transferring      Comments walk test results first full day of  exercise      Duration -- Progress to 30 minutes of  aerobic without signs/symptoms of physical distress      Intensity -- THRR unchanged        Progression   Progression -- Continue to progress workloads to maintain intensity without signs/symptoms of physical distress.      Average METs -- 1.7        Resistance Training   Training Prescription -- Yes      Weight -- 3 lb      Reps -- 10-15        Interval Training   Interval Training -- No        NuStep   Level -- 1      Minutes -- 30      METs -- 1.7             Exercise Comments:  Exercise Comments    Row Name 05/29/20 1030           Exercise Comments First full day of exercise!  Patient was oriented to gym and equipment including functions, settings, policies, and procedures.  Patient's individual exercise prescription and treatment plan were reviewed.  All starting workloads were established based on the results of the 6 minute walk test done at initial orientation visit.  The plan for exercise progression was also introduced and progression will be customized based on patient's performance and goals.Mikki Santee was not strong enough to get out of the transport chair to  get on scales. He also required assistance to get on and off his exercise equipment.  Note sent to his PMD and referring MD to ask for outpatient Physical Therapy referral and treatment  before  Mikki Santee can return to Cardiac Rehab.  He must be self sufficient to get on and off equipment to attend the program.              Exercise Goals and Review:  Exercise Goals    Row Name 05/23/20 1113             Exercise Goals   Increase Physical Activity Yes       Intervention Provide advice, education, support and counseling about physical activity/exercise needs.;Develop an individualized exercise prescription for aerobic and resistive training based on initial evaluation findings, risk stratification, comorbidities and participant's personal goals.       Expected  Outcomes Short Term: Attend rehab on a regular basis to increase amount of physical activity.;Long Term: Add in home exercise to make exercise part of routine and to increase amount of physical activity.;Long Term: Exercising regularly at least 3-5 days a week.       Increase Strength and Stamina Yes       Intervention Provide advice, education, support and counseling about physical activity/exercise needs.;Develop an individualized exercise prescription for aerobic and resistive training based on initial evaluation findings, risk stratification, comorbidities and participant's personal goals.       Expected Outcomes Short Term: Increase workloads from initial exercise prescription for resistance, speed, and METs.;Short Term: Perform resistance training exercises routinely during rehab and add in resistance training at home;Long Term: Improve cardiorespiratory fitness, muscular endurance and strength as measured by increased METs and functional capacity (6MWT)       Able to understand and use rate of perceived exertion (RPE) scale Yes       Intervention Provide education and explanation on how to use RPE scale       Expected Outcomes Short Term: Able to use RPE daily in rehab to express subjective intensity level;Long Term:  Able to use RPE to guide intensity level when exercising independently       Able to understand and use Dyspnea scale Yes       Intervention Provide education and explanation on how to use Dyspnea scale       Expected Outcomes Short Term: Able to use Dyspnea scale daily in rehab to express subjective sense of shortness of breath during exertion;Long Term: Able to use Dyspnea scale to guide intensity level when exercising independently       Knowledge and understanding of Target Heart Rate Range (THRR) Yes       Intervention Provide education and explanation of THRR including how the numbers were predicted and where they are located for reference       Expected Outcomes Short Term:  Able to state/look up THRR;Short Term: Able to use daily as guideline for intensity in rehab;Long Term: Able to use THRR to govern intensity when exercising independently       Able to check pulse independently Yes       Intervention Provide education and demonstration on how to check pulse in carotid and radial arteries.;Review the importance of being able to check your own pulse for safety during independent exercise       Expected Outcomes Short Term: Able to explain why pulse checking is important during independent exercise;Long Term: Able to check pulse independently and accurately  Understanding of Exercise Prescription Yes       Intervention Provide education, explanation, and written materials on patient's individual exercise prescription       Expected Outcomes Short Term: Able to explain program exercise prescription;Long Term: Able to explain home exercise prescription to exercise independently              Exercise Goals Re-Evaluation :  Exercise Goals Re-Evaluation    Row Name 05/29/20 1030             Exercise Goal Re-Evaluation   Exercise Goals Review Able to understand and use rate of perceived exertion (RPE) scale;Knowledge and understanding of Target Heart Rate Range (THRR);Understanding of Exercise Prescription       Comments Reviewed RPE and dyspnea scales, THR and program prescription with pt today.  Pt voiced understanding and was given a copy of goals to take home.       Expected Outcomes Short: Use RPE daily to regulate intensity. Long: Follow program prescription in THR.              Discharge Exercise Prescription (Final Exercise Prescription Changes):  Exercise Prescription Changes - 05/29/20 1500      Response to Exercise   Blood Pressure (Admit) 136/74    Blood Pressure (Exercise) 148/76    Blood Pressure (Exit) 124/74    Heart Rate (Admit) 72 bpm    Heart Rate (Exercise) 115 bpm    Heart Rate (Exit) 83 bpm    Rating of Perceived Exertion  (Exercise) 12    Symptoms leg weakness, difficulty transferring    Comments first full day of exercise    Duration Progress to 30 minutes of  aerobic without signs/symptoms of physical distress    Intensity THRR unchanged      Progression   Progression Continue to progress workloads to maintain intensity without signs/symptoms of physical distress.    Average METs 1.7      Resistance Training   Training Prescription Yes    Weight 3 lb    Reps 10-15      Interval Training   Interval Training No      NuStep   Level 1    Minutes 30    METs 1.7           Nutrition:  Target Goals: Understanding of nutrition guidelines, daily intake of sodium <15107m, cholesterol <2047m calories 30% from fat and 7% or less from saturated fats, daily to have 5 or more servings of fruits and vegetables.  Education: Controlling Sodium/Reading Food Labels -Group verbal and written material supporting the discussion of sodium use in heart healthy nutrition. Review and explanation with models, verbal and written materials for utilization of the food label.   Education: General Nutrition Guidelines/Fats and Fiber: -Group instruction provided by verbal, written material, models and posters to present the general guidelines for heart healthy nutrition. Gives an explanation and review of dietary fats and fiber.   Biometrics:  Pre Biometrics - 05/23/20 1114      Pre Biometrics   Height _0  (1.676 m)   unable to stand unsupported   Weight 180 lb (81.6 kg)    BMI (Calculated) 29.07            Nutrition Therapy Plan and Nutrition Goals:   Nutrition Assessments:  Nutrition Assessments - 05/23/20 1115      MEDFICTS Scores   Pre Score 48           MEDIFICTS Score Key:          ?  70 Need to make dietary changes          40-70 Heart Healthy Diet         ? 40 Therapeutic Level Cholesterol Diet  Nutrition Goals Re-Evaluation:   Nutrition Goals Discharge (Final Nutrition Goals  Re-Evaluation):   Psychosocial: Target Goals: Acknowledge presence or absence of significant depression and/or stress, maximize coping skills, provide positive support system. Participant is able to verbalize types and ability to use techniques and skills needed for reducing stress and depression.   Education: Depression - Provides group verbal and written instruction on the correlation between heart/lung disease and depressed mood, treatment options, and the stigmas associated with seeking treatment.   Education: Sleep Hygiene -Provides group verbal and written instruction about how sleep can affect your health.  Define sleep hygiene, discuss sleep cycles and impact of sleep habits. Review good sleep hygiene tips.     Education: Stress and Anxiety: - Provides group verbal and written instruction about the health risks of elevated stress and causes of high stress.  Discuss the correlation between heart/lung disease and anxiety and treatment options. Review healthy ways to manage with stress and anxiety.    Initial Review & Psychosocial Screening:  Initial Psych Review & Screening - 05/14/20 1343      Initial Review   Current issues with Current Depression;History of Depression;Current Anxiety/Panic;Current Stress Concerns    Source of Stress Concerns Chronic Illness;Retirement/disability;Unable to perform yard/household activities    Comments Patient has had a stroke and is up in age and he said that comes with alot of anxiety. His shortness of breath makes him very anxious. He lives alone and states that a care taker come to the house to do house work for him.      Family Dynamics   Good Support System? Yes    Comments Waylyn cantalk to his son when he needs support.      Barriers   Psychosocial barriers to participate in program The patient should benefit from training in stress management and relaxation.      Screening Interventions   Interventions Encouraged to exercise;To  provide support and resources with identified psychosocial needs;Provide feedback about the scores to participant    Expected Outcomes Short Term goal: Utilizing psychosocial counselor, staff and physician to assist with identification of specific Stressors or current issues interfering with healing process. Setting desired goal for each stressor or current issue identified.;Long Term Goal: Stressors or current issues are controlled or eliminated.;Short Term goal: Identification and review with participant of any Quality of Life or Depression concerns found by scoring the questionnaire.;Long Term goal: The participant improves quality of Life and PHQ9 Scores as seen by post scores and/or verbalization of changes           Quality of Life Scores:   Quality of Life - 05/23/20 1114      Quality of Life   Select Quality of Life      Quality of Life Scores   Health/Function Pre 18.56 %    Socioeconomic Pre 28.8 %    Psych/Spiritual Pre 23.14 %    Family Pre 30 %    GLOBAL Pre 23 %          Scores of 19 and below usually indicate a poorer quality of life in these areas.  A difference of  2-3 points is a clinically meaningful difference.  A difference of 2-3 points in the total score of the Quality of Life Index has been associated with  significant improvement in overall quality of life, self-image, physical symptoms, and general health in studies assessing change in quality of life.  PHQ-9: Recent Review Flowsheet Data    Depression screen [facility] 2/9 05/23/2020   Decreased Interest 2   Down, Depressed, Hopeless 1   PHQ - 2 Score 3   Altered sleeping 0   Tired, decreased energy 1   Change in appetite 1   Feeling bad or failure about yourself  1   Trouble concentrating 0   Moving slowly or fidgety/restless 0   Suicidal thoughts 0   PHQ-9 Score 6   Difficult doing work/chores Somewhat difficult     Interpretation of Total Score  Total Score Depression Severity:  1-4 = Minimal  depression, 5-9 = Mild depression, 10-14 = Moderate depression, 15-19 = Moderately severe depression, 20-27 = Severe depression   Psychosocial Evaluation and Intervention:  Psychosocial Evaluation - 05/14/20 1346      Psychosocial Evaluation & Interventions   Interventions Encouraged to exercise with the program and follow exercise prescription    Comments Patient has had a stroke and is up in age and he said that comes with alot of anxiety. His shortness of breath makes him very anxious. He lives alone and states that a care taker come to the house to do house work for him.    Expected Outcomes Short: Exercise regularly to support mental health and notify staff of any changes. Long: maintain mental health and well being through teaching of rehab or prescribed medications independently.    Continue Psychosocial Services  Follow up required by staff           Psychosocial Re-Evaluation:   Psychosocial Discharge (Final Psychosocial Re-Evaluation):   Vocational Rehabilitation: Provide vocational rehab assistance to qualifying candidates.   Vocational Rehab Evaluation & Intervention:   Education: Education Goals: Education classes will be provided on a variety of topics geared toward better understanding of heart health and risk factor modification. Participant will state understanding/return demonstration of topics presented as noted by education test scores.  Learning Barriers/Preferences:  Learning Barriers/Preferences - 05/14/20 1343      Learning Barriers/Preferences   Learning Barriers Hearing;Sight;Exercise Concerns   History of CVA   Learning Preferences None           General Cardiac Education Topics:  AED/CPR: - Group verbal and written instruction with the use of models to demonstrate the basic use of the AED with the basic ABC's of resuscitation.   Anatomy & Physiology of the Heart: - Group verbal and written instruction and models provide basic cardiac anatomy  and physiology, with the coronary electrical and arterial systems. Review of Valvular disease and Heart Failure   Cardiac Procedures: - Group verbal and written instruction to review commonly prescribed medications for heart disease. Reviews the medication, class of the drug, and side effects. Includes the steps to properly store meds and maintain the prescription regimen. (beta blockers and nitrates)   Cardiac Medications I: - Group verbal and written instruction to review commonly prescribed medications for heart disease. Reviews the medication, class of the drug, and side effects. Includes the steps to properly store meds and maintain the prescription regimen.   Cardiac Medications II: -Group verbal and written instruction to review commonly prescribed medications for heart disease. Reviews the medication, class of the drug, and side effects. (all other drug classes)    Go Sex-Intimacy & Heart Disease, Get SMART - Goal Setting: - Group verbal and written instruction through game format  to discuss heart disease and the return to sexual intimacy. Provides group verbal and written material to discuss and apply goal setting through the application of the S.M.A.R.T. Method.   Other Matters of the Heart: - Provides group verbal, written materials and models to describe Stable Angina and Peripheral Artery. Includes description of the disease process and treatment options available to the cardiac patient.   Infection Prevention: - Provides verbal and written material to individual with discussion of infection control including proper hand washing and proper equipment cleaning during exercise session.   Cardiac Rehab from 05/23/2020 in [facility] Cardiac and Pulmonary Rehab  Date 05/14/20  Educator [facility]  Instruction Review Code 1- Verbalizes Understanding      Falls Prevention: - Provides verbal and written material to individual with discussion of falls prevention and safety.   Cardiac Rehab  from 05/23/2020 in [facility] Cardiac and Pulmonary Rehab  Date 05/14/20  Educator [facility]  Instruction Review Code 1- Verbalizes Understanding      Other: -Provides group and verbal instruction on various topics (see comments)   Knowledge Questionnaire Score:  Knowledge Questionnaire Score - 05/23/20 1115      Knowledge Questionnaire Score   Pre Score --   unable to complete          Core Components/Risk Factors/Patient Goals at Admission:  Personal Goals and Risk Factors at Admission - 05/23/20 1116      Core Components/Risk Factors/Patient Goals on Admission    Weight Management Yes;Weight Loss    Intervention Weight Management: Develop a combined nutrition and exercise program designed to reach desired caloric intake, while maintaining appropriate intake of nutrient and fiber, sodium and fats, and appropriate energy expenditure required for the weight goal.;Weight Management: Provide education and appropriate resources to help participant work on and attain dietary goals.    Admit Weight 180 lb (81.6 kg)    Goal Weight: Short Term 175 lb (79.4 kg)    Goal Weight: Long Term 170 lb (77.1 kg)    Expected Outcomes Short Term: Continue to assess and modify interventions until short term weight is achieved;Long Term: Adherence to nutrition and physical activity/exercise program aimed toward attainment of established weight goal;Weight Loss: Understanding of general recommendations for a balanced deficit meal plan, which promotes 1-2 lb weight loss per week and includes a negative energy balance of [phone] kcal/d;Understanding recommendations for meals to include 15-35% energy as protein, 25-35% energy from fat, 35-60% energy from carbohydrates, less than 252m of dietary cholesterol, 20-35 gm of total fiber daily;Understanding of distribution of calorie intake throughout the day with the consumption of 4-5 meals/snacks    Heart Failure Yes    Intervention Provide a combined exercise and nutrition  program that is supplemented with education, support and counseling about heart failure. Directed toward relieving symptoms such as shortness of breath, decreased exercise tolerance, and extremity edema.    Expected Outcomes Improve functional capacity of life;Short term: Attendance in program 2-3 days a week with increased exercise capacity. Reported lower sodium intake. Reported increased fruit and vegetable intake. Reports medication compliance.;Short term: Daily weights obtained and reported for increase. Utilizing diuretic protocols set by physician.;Long term: Adoption of self-care skills and reduction of barriers for early signs and symptoms recognition and intervention leading to self-care maintenance.    Hypertension Yes    Intervention Provide education on lifestyle modifcations including regular physical activity/exercise, weight management, moderate sodium restriction and increased consumption of fresh fruit, vegetables, and low fat dairy, alcohol moderation, and smoking cessation.;Monitor prescription  use compliance.    Expected Outcomes Short Term: Continued assessment and intervention until BP is < 140/1m HG in hypertensive participants. < 130/813mHG in hypertensive participants with diabetes, heart failure or chronic kidney disease.;Long Term: Maintenance of blood pressure at goal levels.    Lipids Yes    Intervention Provide education and support for participant on nutrition & aerobic/resistive exercise along with prescribed medications to achieve LDL <702mHDL >29m12m  Expected Outcomes Short Term: Participant states understanding of desired cholesterol values and is compliant with medications prescribed. Participant is following exercise prescription and nutrition guidelines.;Long Term: Cholesterol controlled with medications as prescribed, with individualized exercise RX and with personalized nutrition plan. Value goals: LDL < 70mg33mL > 40 mg.           Education:Diabetes -  Individual verbal and written instruction to review signs/symptoms of diabetes, desired ranges of glucose level fasting, after meals and with exercise. Acknowledge that pre and post exercise glucose checks will be done for 3 sessions at entry of program.   Education: Know Your Numbers and Risk Factors: -Group verbal and written instruction about important numbers in your health.  Discussion of what are risk factors and how they play a role in the disease process.  Review of Cholesterol, Blood Pressure, Diabetes, and BMI and the role they play in your overall health.   Core Components/Risk Factors/Patient Goals Review:    Core Components/Risk Factors/Patient Goals at Discharge (Final Review):    ITP Comments:  ITP Comments    Row Name 05/14/20 1347 05/23/20 1050 05/29/20 1029 06/13/20 0724 07/12/20 0748   ITP Comments Virtual Visit completed. Patient informed on EP and RD appointment and 6 Minute walk test. Patient also informed of patient health questionnaires on My Chart. Patient Verbalizes understanding. Visit diagnosis can be found [facility]. Completed 6MWT and gym orientation. Initial ITP created and sent for review to Dr. Mark Emily Filbertical Director. First full day of exercise!  Patient was oriented to gym and equipment including functions, settings, policies, and procedures.  Patient's individual exercise prescription and treatment plan were reviewed.  All starting workloads were established based on the results of the 6 minute walk test done at initial orientation visit.  The plan for exercise progression was also introduced and progression will be customized based on patient's performance and goals.Bob wMikki Santeenot strong enough to get out of the transport chair to get on scales. He also required assistance to get on and off his exercise equipment.  Note sent to his PMD and referring MD to ask for outpatient Physical Therapy referral and treatment  before  Bob cMikki Santeereturn to Cardiac Rehab.   He must be self sufficient to get on and off equipment to attend the program. 30 Day review completed. Medical Director ITP review done, changes made as directed, and signed approval by Medical Director. 30 day review completed. ITP sent to Dr. Mark Emily Filbertical Director of Cardiac and Pulmonary Rehab. Continue with ITP unless changes are made by physician. Continues to need PT.   Row NOrient 08/08/20 0622 08/21/20 0921         ITP Comments 30 Day review completed. Medical Director ITP review done, changes made as directed, and signed approval by Medical Director. Sent letter to discharge.  Still no notes of completing phsyical therapy and conditioning does not appear to have improved.  We will discharge him from the program at this time.  Comments: Discharge ITP

## 2020-08-27 DIAGNOSIS — H353132 Nonexudative age-related macular degeneration, bilateral, intermediate dry stage: Secondary | ICD-10-CM | POA: Diagnosis not present

## 2020-08-27 DIAGNOSIS — H401132 Primary open-angle glaucoma, bilateral, moderate stage: Secondary | ICD-10-CM | POA: Diagnosis not present

## 2020-09-01 ENCOUNTER — Other Ambulatory Visit: Payer: Self-pay

## 2020-09-01 ENCOUNTER — Emergency Department
Admission: EM | Admit: 2020-09-01 | Discharge: 2020-09-02 | Disposition: A | Discharge status: Home / Self Care | Payer: Medicare HMO | Attending: Emergency Medicine | Admitting: Emergency Medicine

## 2020-09-01 ENCOUNTER — Encounter: Payer: Self-pay | Admitting: *Deleted

## 2020-09-01 ENCOUNTER — Emergency Department: Payer: Medicare HMO

## 2020-09-01 DIAGNOSIS — I2510 Atherosclerotic heart disease of native coronary artery without angina pectoris: Secondary | ICD-10-CM | POA: Diagnosis not present

## 2020-09-01 DIAGNOSIS — Z880 Allergy status to penicillin: Secondary | ICD-10-CM | POA: Insufficient documentation

## 2020-09-01 DIAGNOSIS — Z85828 Personal history of other malignant neoplasm of skin: Secondary | ICD-10-CM | POA: Diagnosis not present

## 2020-09-01 DIAGNOSIS — I110 Hypertensive heart disease with heart failure: Secondary | ICD-10-CM | POA: Insufficient documentation

## 2020-09-01 DIAGNOSIS — I5022 Chronic systolic (congestive) heart failure: Secondary | ICD-10-CM | POA: Diagnosis not present

## 2020-09-01 DIAGNOSIS — R0902 Hypoxemia: Secondary | ICD-10-CM | POA: Diagnosis not present

## 2020-09-01 DIAGNOSIS — Z882 Allergy status to sulfonamides status: Secondary | ICD-10-CM | POA: Insufficient documentation

## 2020-09-01 DIAGNOSIS — Z8673 Personal history of transient ischemic attack (TIA), and cerebral infarction without residual deficits: Secondary | ICD-10-CM | POA: Insufficient documentation

## 2020-09-01 DIAGNOSIS — Z96651 Presence of right artificial knee joint: Secondary | ICD-10-CM | POA: Diagnosis not present

## 2020-09-01 DIAGNOSIS — I959 Hypotension, unspecified: Secondary | ICD-10-CM | POA: Diagnosis not present

## 2020-09-01 DIAGNOSIS — T5991XA Toxic effect of unspecified gases, fumes and vapors, accidental (unintentional), initial encounter: Secondary | ICD-10-CM | POA: Diagnosis not present

## 2020-09-01 DIAGNOSIS — Z87891 Personal history of nicotine dependence: Secondary | ICD-10-CM | POA: Insufficient documentation

## 2020-09-01 DIAGNOSIS — Z79899 Other long term (current) drug therapy: Secondary | ICD-10-CM | POA: Insufficient documentation

## 2020-09-01 DIAGNOSIS — R001 Bradycardia, unspecified: Secondary | ICD-10-CM | POA: Diagnosis not present

## 2020-09-01 DIAGNOSIS — R0600 Dyspnea, unspecified: Secondary | ICD-10-CM | POA: Diagnosis not present

## 2020-09-01 DIAGNOSIS — R55 Syncope and collapse: Secondary | ICD-10-CM | POA: Diagnosis not present

## 2020-09-01 DIAGNOSIS — J45909 Unspecified asthma, uncomplicated: Secondary | ICD-10-CM | POA: Diagnosis not present

## 2020-09-01 DIAGNOSIS — J449 Chronic obstructive pulmonary disease, unspecified: Secondary | ICD-10-CM | POA: Insufficient documentation

## 2020-09-01 DIAGNOSIS — Z7982 Long term (current) use of aspirin: Secondary | ICD-10-CM | POA: Insufficient documentation

## 2020-09-01 DIAGNOSIS — R0602 Shortness of breath: Secondary | ICD-10-CM | POA: Diagnosis not present

## 2020-09-01 DIAGNOSIS — Z950 Presence of cardiac pacemaker: Secondary | ICD-10-CM | POA: Diagnosis not present

## 2020-09-01 DIAGNOSIS — R531 Weakness: Secondary | ICD-10-CM | POA: Insufficient documentation

## 2020-09-01 LAB — CBC
HCT: 41.6 % (ref 39.0–52.0)
Hemoglobin: 14.7 g/dL (ref 13.0–17.0)
MCH: 33.7 pg (ref 26.0–34.0)
MCHC: 35.3 g/dL (ref 30.0–36.0)
MCV: 95.4 fL (ref 80.0–100.0)
Platelets: 210 10*3/uL (ref 150–400)
RBC: 4.36 MIL/uL (ref 4.22–5.81)
RDW: 12.9 % (ref 11.5–15.5)
WBC: 8 10*3/uL (ref 4.0–10.5)
nRBC: 0 % (ref 0.0–0.2)

## 2020-09-01 LAB — COMPREHENSIVE METABOLIC PANEL
ALT: 17 U/L (ref 0–44)
AST: 23 U/L (ref 15–41)
Albumin: 4.3 g/dL (ref 3.5–5.0)
Alkaline Phosphatase: 77 U/L (ref 38–126)
Anion gap: 14 (ref 5–15)
BUN: 11 mg/dL (ref 8–23)
CO2: 25 mmol/L (ref 22–32)
Calcium: 8.9 mg/dL (ref 8.9–10.3)
Chloride: 98 mmol/L (ref 98–111)
Creatinine, Ser: 1.02 mg/dL (ref 0.61–1.24)
GFR, Estimated: >60 mL/min (ref >60)
Glucose, Bld: 142 mg/dL — ABNORMAL HIGH (ref 70–99)
Potassium: 3.5 mmol/L (ref 3.5–5.1)
Sodium: 137 mmol/L (ref 135–145)
Total Bilirubin: 1.4 mg/dL — ABNORMAL HIGH (ref 0.3–1.2)
Total Protein: 7.2 g/dL (ref 6.5–8.1)

## 2020-09-01 NOTE — ED Provider Notes (Signed)
[facility] Emergency Department Provider Note  ____________________________________________   First MD Initiated Contact with Patient 09/01/20 2334     (approximate)  I have reviewed the triage vital signs and the nursing notes.   HISTORY  Chief Complaint Weakness and Fall    HPI Darrell Jacobson is a 86 y.o. male with medical history as listed below who presents by EMS for evaluation of acute shortness of breath.  The patient reports that the "cleaning lady" was using some harsh chemicals that caused his eyes to burn and him to become short of breath.  However, during the triage assessment, one of the triage nurses spoke with the patient's son and he said he was unaware of the presence of any chemicals in the house.  Regardless the patient states consistently through several interviews that he had a reaction to the harsh chemicals and that he felt short of breath but now he feels better.  He has some persistent nausea but has had no vomiting.  He denies pain including headache, neck pain, chest pain, sore throat, abdominal pain, and dysuria.  He has not been wheezing and does not currently feel short of breath or that breathing is more difficult than usual.  He has some weakness as result of the stroke that occurred within the last year or so, and said that he is wheelchair-bound at baseline, but he feels no more weak than usual.  He has no new numbness in any of his extremities.  He has had no visual changes.  The onset of the symptoms was acute and the symptoms were initially moderate to severe but now he feels fine except for some persistent nausea.         Past Medical History:  Diagnosis Date  . Arthritis   . Asthma    COPD with inhaler use  . Cancer [facility]) 2013   Basal Cell Skin CA resected from scalp.  . Carotid stenosis    pateint states he has Left carotid blockage worse than Right.   . CHF (congestive heart failure) ([place])   . Collagen vascular  disease ([place])   . COPD (chronic obstructive pulmonary disease) ([place])   . Coronary artery disease   . Dyspnea   . GERD (gastroesophageal reflux disease)   . Glaucoma   . History of hiatal hernia   . History of shingles 2012   forehead  . Hypertension   . Multilevel degenerative disc disease   . Neuropathy    legs and feet  . Pacemaker 2006    Patient Active Problem List   Diagnosis Date Noted  . Right-sided chest pain   . Community acquired pneumonia 09/30/2019  . Essential hypertension 09/30/2019  . Chronic systolic CHF (congestive heart failure) ([place]) 09/30/2019  . Complete heart block ([place]) 09/30/2019  . History of cerebrovascular disease 09/30/2019  . PVD (peripheral vascular disease) ([place]) 09/30/2019  . Chronic obstructive pulmonary disease ([place]) 09/30/2019  . Sepsis with acute renal failure without septic shock ([place])   . Lobar pneumonia ([place])   . Hyperlipidemia   . TIA (transient ischemic attack) 05/03/2019    Past Surgical History:  Procedure Laterality Date  . BACK SURGERY     2 lumbar, 1 thoracic  . CATARACT EXTRACTION W/PHACO Right 12/24/2016   Procedure: CATARACT EXTRACTION PHACO AND INTRAOCULAR LENS PLACEMENT ([place])  Right  complicated;  Surgeon: Leandrew Koyanagi, MD;  Location: [place];  Service: Ophthalmology;  Laterality: Right;  [place]  . JOINT REPLACEMENT  Right    knee  . SKIN DEBRIDEMENT  06/13/2006   facial  . SKIN GRAFT Right 07/11/2016   facial    Prior to Admission medications   Medication Sig Start Date End Date Taking? Authorizing Provider  albuterol (PROAIR HFA) 108 (90 BASE) MCG/ACT inhaler Inhale 2 puffs into the lungs every 6 (six) hours as needed. 01/25/15 09/30/19  [provider]  aspirin EC 81 MG tablet Take 1 tablet by mouth daily.    [provider]  bimatoprost (LUMIGAN) 0.01 % SOLN Apply 1 Dose to eye at bedtime. 01/16/14   [provider]  BREO ELLIPTA 100-25 MCG/INH AEPB Inhale 1 puff into  the lungs daily. 09/23/19   [provider]  Cholecalciferol (D 2000) 2000 UNITS TABS Take 1 tablet by mouth daily.    [provider]  doxazosin (CARDURA) 8 MG tablet Take 1 tablet by mouth at bedtime. 06/21/15   [provider]  feeding supplement, ENSURE ENLIVE, (ENSURE ENLIVE) LIQD Take 237 mLs by mouth 2 (two) times daily between meals. 10/02/19   Loletha Grayer, MD  Ferrous Sulfate (SLOW FE PO) Take by mouth.    [provider]  LORazepam (ATIVAN) 0.5 MG tablet Take 1 tablet (0.5 mg total) by mouth 2 (two) times daily as needed for anxiety. Patient taking differently: Take 0.5 mg by mouth 3 (three) times daily.  05/04/19   Hillary Bow, MD  montelukast (SINGULAIR) 10 MG tablet Take 1 tablet by mouth at bedtime. 01/15/15   [provider]  Multiple Vitamin (MULTI-VITAMINS) TABS Take 1 tablet by mouth daily.    [provider]  nebivolol (BYSTOLIC) 2.5 MG tablet Take 1 tablet by mouth 3 (three) times daily. 06/26/15   [provider]  omeprazole (PRILOSEC) 20 MG capsule Take 1 capsule by mouth daily. 03/19/15   [provider]  prednisoLONE acetate (PRED FORTE) 1 % ophthalmic suspension Place 1 drop into the right eye daily. 12/31/18   [provider]  valACYclovir (VALTREX) 500 MG tablet Take 500 mg by mouth daily. 06/23/19   [provider]    Allergies Prednisone, Acyclovir, Albuterol sulfate, Amlodipine, Brinzolamide, Budesonide-formoterol fumarate, Buspirone, Cefdinir, Celecoxib, Chlorthalidone, Clopidogrel, Codeine, Diazepam, Dicyclomine, Duloxetine, Famciclovir, Felodipine, Furosemide, Gabapentin, Hydrochlorothiazide, Indomethacin, Lisinopril, Losartan, Meclizine, Meloxicam, Metaxalone, Metoprolol tartrate, Morphine, Nabumetone, Naproxen, Olmesartan, Oxycodone, Penicillin v potassium, Prasugrel, Promethazine, Sulfa antibiotics, Torsemide, Tramadol, and Zolpidem  History reviewed. No pertinent family  history.  Social History Social History   Tobacco Use  . Smoking status: Former Smoker    Packs/day: 1.50    Years: 38.00    Pack years: 57.00    Types: Cigarettes    Quit date: 11/03/1984    Years since quitting: 35.8  . Smokeless tobacco: Never Used  Substance Use Topics  . Alcohol use: Yes    Alcohol/week: 14.0 standard drinks    Types: 14 Cans of beer per week  . Drug use: Not Currently    Review of Systems Constitutional: No fever/chills Eyes: No visual changes. ENT: No sore throat. Cardiovascular: Denies chest pain. Respiratory: Acute dyspnea, now resolved Gastrointestinal: No abdominal pain.  Nausea, no vomiting.  No diarrhea.  No constipation. Genitourinary: Negative for dysuria. Musculoskeletal: Negative for neck pain.  Negative for back pain. Integumentary: Negative for rash. Neurological: Negative for headaches, focal weakness or numbness.   ____________________________________________   PHYSICAL EXAM:  VITAL SIGNS: ED Triage Vitals  Enc Vitals Group     BP 09/01/20 2219 (!) 105/49  Pulse Rate 09/01/20 2219 71     Resp 09/01/20 2219 20     Temp 09/01/20 2219 97.7 F (36.5 C)     Temp Source 09/01/20 2219 Oral     SpO2 09/01/20 2219 93 %     Weight --      Height --      Head Circumference --      Peak Flow --      Pain Score 09/01/20 2220 3     Pain Loc --      Pain Edu? --      Excl. in [place]? --     Constitutional: Alert and oriented to person and location.  Patient does not appear to be particularly confused or disoriented at this time. Eyes: Conjunctivae are normal.  Head: Atraumatic. Nose: No congestion/rhinnorhea. Mouth/Throat: Patient is wearing a mask. Neck: No stridor.  No meningeal signs.   Cardiovascular: Normal rate, regular rhythm. Good peripheral circulation. Grossly normal heart sounds. Respiratory: Normal respiratory effort.  No retractions.  His lungs are clear to auscultation with no wheezing, rales, or  rhonchi. Gastrointestinal: Soft and nontender. No distention.  Musculoskeletal: No lower extremity tenderness nor edema. No gross deformities of extremities. Neurologic:  Normal speech and language. No gross focal neurologic deficits are appreciated.  He does have generalized weakness bilaterally in his legs but I do not appreciate a substantial difference between the 2 sides.  He has good grip strength in both hands and good flexion extension of his upper extremity major muscle groups.  No obvious facial droop. Skin:  Skin is warm, dry and intact.  He has a large scar on his scalp from prior basal cell surgery. Psychiatric: Mood and affect are normal. Speech and behavior are normal.  ____________________________________________   LABS (all labs ordered are listed, but only abnormal results are displayed)  Labs Reviewed  COMPREHENSIVE METABOLIC PANEL - Abnormal; Notable for the following components:      Result Value   Glucose, Bld 142 (*)    Total Bilirubin 1.4 (*)    All other components within normal limits  CBC  CBG MONITORING, ED   ____________________________________________  EKG  ED ECG REPORT I, Hinda Kehr, the attending physician, personally viewed and interpreted this ECG.  Date: 08/31/2020 EKG Time: 22: 13 Rate: 69 Rhythm: Atrial paced rhythm QRS Axis: Atrial paced rhythm Intervals: abnormal due to pacemaker ST/T Wave abnormalities: No particular changes to ST segments or T waves Narrative Interpretation: No acute ischemia evidence of paced rhythm   ____________________________________________  RADIOLOGY I, Hinda Kehr, personally viewed and evaluated these images (plain radiographs) as part of my medical decision making, as well as reviewing the written report by the radiologist.  ED MD interpretation:  No acute abnormalities identified on CXR  Official radiology report(s): DG Chest Portable 1 View  Result Date: 09/02/2020 CLINICAL DATA:  Near syncope.  EXAM: PORTABLE CHEST 1 VIEW COMPARISON:  July 04, 2020 FINDINGS: A dual lead AICD is noted. The cardiac silhouette is mildly enlarged and unchanged in size. Both lungs are clear. Degenerative changes are seen within the thoracic spine. IMPRESSION: No active disease. Electronically Signed   By: Virgina Norfolk M.D.   On: 09/02/2020 00:17    ____________________________________________   PROCEDURES   Procedure(s) performed (including Critical Care):  Procedures   ____________________________________________   INITIAL IMPRESSION / MDM / [place] / ED COURSE  As part of my medical decision making, I reviewed the following data within the electronic medical  record:  History obtained from family, Nursing notes reviewed and incorporated, Labs reviewed , EKG interpreted , Old chart reviewed, Radiograph reviewed  and Notes from prior ED visits   Differential diagnosis includes, but is not limited to, allergic reaction, pneumonitis, pneumonia, aspiration, CHF, COPD, ACS, less likely PE.  The patient is generally well-appearing and in no distress in spite of his chronic issues.  His CBC is within normal limits including his hemoglobin and with no leukocytosis.  Comprehensive metabolic panel is essentially normal other than a very mild elevation of his total bilirubin that is not clinically significant.  Chest x-ray is pending.  EKG shows anticipated paced rhythm.  The patient is on the cardiac monitor to evaluate for evidence of arrhythmia and/or significant heart rate changes.  I reviewed the medical record and see that he has had some issues with confusion, likely age-related, and his PCP and son have recently been working towards placement including filling out an FL2.  Additional work-up pending includes urinalysis and chest x-ray.  At this point there is no evidence of acute neurological insult such as CVA and the patient has had no pain, has stable vital signs, is afebrile,  and is in no distress.  It is possible there was some reaction to an allergen or chemical irritant in the house but he is in no distress at this time and has been in the emergency department about 3 hours thus far.  Once I see the urinalysis and chest x-ray I will attempt to touch base with the patient's son but at this point there does not appear to be a reason for inpatient admission or hospitalization.   Chest x-ray back and I personally reviewed and agree with the radiologist interpretation that there is no evidence of acute abnormality.     Clinical Course as of Sep 03 303  Nancy Fetter Sep 02, 2020  0142 The patient has been stable for more than 4-1/2 hours.  No additional shortness of breath.  Given no evidence of acute infection and no recent dysuria I will hold off on a urinalysis.  I called and spoke with the patient's son, Nirvaan Frett, and we discussed the case in detail.  I explained the work-up and the reassuring results and I encouraged him to continue with the plan for outpatient placement as they have been discussing with Dr. Edwina Barth.  He understands and said that he is able to come pick up the patient and will provide transportation back home.  I updated the patient and he understands the plan as well and agrees to follow-up.  I gave my usual and customary return precautions.   [CF]  0143 Of note, the patient's son did say that a cleaner was in a house today and although she does not use any chemicals that he would consider harsh, he said that his father has an episode like this "every single day" where he feels short of breath acutely because of exposure to substance.  There seems to be an anxiety component.   [CF]    Clinical Course User Index [CF] Hinda Kehr, MD     ____________________________________________  FINAL CLINICAL IMPRESSION(S) / ED DIAGNOSES  Final diagnoses:  Acute dyspnea     MEDICATIONS GIVEN DURING THIS VISIT:  Medications - No data to  display   ED Discharge Orders    None      *Please note:  Darrell Jacobson was evaluated in Emergency Department on 09/02/2020 for the symptoms described in the history  of present illness. He was evaluated in the context of the global COVID-19 pandemic, which necessitated consideration that the patient might be at risk for infection with the SARS-CoV-2 virus that causes COVID-19. Institutional protocols and algorithms that pertain to the evaluation of patients at risk for COVID-19 are in a state of rapid change based on information released by regulatory bodies including the CDC and federal and state organizations. These policies and algorithms were followed during the patient's care in the ED.  Some ED evaluations and interventions may be delayed as a result of limited staffing during and after the pandemic.*  Note:  This document was prepared using Dragon voice recognition software and may include unintentional dictation errors.   Hinda Kehr, MD 09/02/20 [phone]

## 2020-09-01 NOTE — ED Notes (Signed)
Patient to waiting room via wheelchair by EMS.  Per EMS patient has fallen (unsure of reason for fall) and was on the floor approximately 1 hour.  Patient also reports short of breath and weakness.  bp 98/64, pulse oxi 95% on room air.

## 2020-09-01 NOTE — ED Triage Notes (Addendum)
Pt presents via EMS. Pt states he called the ambulance and his primary complaint at that time was that the cleaning product used this morning created strong fumes and he was "overcome" with them. Pt denies falling but endorses sitting on the floor and being unable to get up or walk. Pt has significant medical hx and  Is a somewhat poor historian. Pt c/o variously of  Shortness of breath and attributes that to the chemical fumes from cleaner. EMS stated that they smelled nothing. Pt also c/o nausea and abdominal pain.  This RN spoke to son and he denies that there were harsh or unusual chemical cleaners used today. Pt has shingles that are persistent.

## 2020-09-02 ENCOUNTER — Emergency Department: Payer: Medicare HMO

## 2020-09-02 DIAGNOSIS — R55 Syncope and collapse: Secondary | ICD-10-CM | POA: Diagnosis not present

## 2020-09-02 NOTE — Discharge Instructions (Signed)
Your workup in the Emergency Department today was reassuring.  We did not find any specific abnormalities.  We recommend you drink plenty of fluids, take your regular medications and/or any new ones prescribed today, and follow up with the doctor(s) listed in these documents as recommended.  Return to the Emergency Department if you develop new or worsening symptoms that concern you.  

## 2020-10-01 ENCOUNTER — Other Ambulatory Visit: Payer: Self-pay | Admitting: Neurology

## 2020-10-01 DIAGNOSIS — H547 Unspecified visual loss: Secondary | ICD-10-CM

## 2020-10-01 DIAGNOSIS — R42 Dizziness and giddiness: Secondary | ICD-10-CM | POA: Diagnosis not present

## 2020-10-01 DIAGNOSIS — Z8673 Personal history of transient ischemic attack (TIA), and cerebral infarction without residual deficits: Secondary | ICD-10-CM | POA: Diagnosis not present

## 2020-10-01 DIAGNOSIS — I69393 Ataxia following cerebral infarction: Secondary | ICD-10-CM | POA: Diagnosis not present

## 2020-10-01 DIAGNOSIS — Z8619 Personal history of other infectious and parasitic diseases: Secondary | ICD-10-CM | POA: Diagnosis not present

## 2020-10-01 DIAGNOSIS — I6523 Occlusion and stenosis of bilateral carotid arteries: Secondary | ICD-10-CM | POA: Diagnosis not present

## 2020-10-11 ENCOUNTER — Ambulatory Visit
Admission: RE | Admit: 2020-10-11 | Discharge: 2020-10-11 | Disposition: A | Discharge status: Home / Self Care | Payer: Medicare HMO | Source: Ambulatory Visit | Attending: Neurology | Admitting: Neurology

## 2020-10-11 ENCOUNTER — Other Ambulatory Visit: Payer: Self-pay

## 2020-10-11 DIAGNOSIS — I7789 Other specified disorders of arteries and arterioles: Secondary | ICD-10-CM | POA: Diagnosis not present

## 2020-10-11 DIAGNOSIS — I6389 Other cerebral infarction: Secondary | ICD-10-CM | POA: Diagnosis not present

## 2020-10-11 DIAGNOSIS — Z8673 Personal history of transient ischemic attack (TIA), and cerebral infarction without residual deficits: Secondary | ICD-10-CM | POA: Insufficient documentation

## 2020-10-11 DIAGNOSIS — H547 Unspecified visual loss: Secondary | ICD-10-CM | POA: Insufficient documentation

## 2020-10-11 DIAGNOSIS — I728 Aneurysm of other specified arteries: Secondary | ICD-10-CM | POA: Diagnosis not present

## 2020-10-11 LAB — POCT I-STAT CREATININE: Creatinine, Ser: 1 mg/dL (ref 0.61–1.24)

## 2020-10-11 MED ORDER — IOHEXOL 350 MG/ML SOLN
75.0000 mL | Freq: Once | INTRAVENOUS | Status: AC | PRN
  Administered 2020-10-11: 75 mL via INTRAVENOUS

## 2020-10-23 DIAGNOSIS — R2689 Other abnormalities of gait and mobility: Secondary | ICD-10-CM | POA: Diagnosis not present

## 2020-10-23 DIAGNOSIS — Z8673 Personal history of transient ischemic attack (TIA), and cerebral infarction without residual deficits: Secondary | ICD-10-CM | POA: Diagnosis not present

## 2020-10-23 DIAGNOSIS — R531 Weakness: Secondary | ICD-10-CM | POA: Diagnosis not present

## 2020-10-30 DIAGNOSIS — E782 Mixed hyperlipidemia: Secondary | ICD-10-CM | POA: Diagnosis not present

## 2020-10-30 DIAGNOSIS — I5022 Chronic systolic (congestive) heart failure: Secondary | ICD-10-CM | POA: Diagnosis not present

## 2020-10-30 DIAGNOSIS — I25118 Atherosclerotic heart disease of native coronary artery with other forms of angina pectoris: Secondary | ICD-10-CM | POA: Diagnosis not present

## 2020-10-30 DIAGNOSIS — I6523 Occlusion and stenosis of bilateral carotid arteries: Secondary | ICD-10-CM | POA: Diagnosis not present

## 2020-10-30 DIAGNOSIS — I442 Atrioventricular block, complete: Secondary | ICD-10-CM | POA: Diagnosis not present

## 2020-11-02 DIAGNOSIS — I5022 Chronic systolic (congestive) heart failure: Secondary | ICD-10-CM | POA: Diagnosis not present

## 2020-11-02 DIAGNOSIS — G629 Polyneuropathy, unspecified: Secondary | ICD-10-CM | POA: Diagnosis not present

## 2020-11-02 DIAGNOSIS — I69393 Ataxia following cerebral infarction: Secondary | ICD-10-CM | POA: Diagnosis not present

## 2020-11-02 DIAGNOSIS — I110 Hypertensive heart disease with heart failure: Secondary | ICD-10-CM | POA: Diagnosis not present

## 2020-11-02 DIAGNOSIS — I2510 Atherosclerotic heart disease of native coronary artery without angina pectoris: Secondary | ICD-10-CM | POA: Diagnosis not present

## 2020-11-02 DIAGNOSIS — I69351 Hemiplegia and hemiparesis following cerebral infarction affecting right dominant side: Secondary | ICD-10-CM | POA: Diagnosis not present

## 2020-11-02 DIAGNOSIS — I4891 Unspecified atrial fibrillation: Secondary | ICD-10-CM | POA: Diagnosis not present

## 2020-11-02 DIAGNOSIS — J449 Chronic obstructive pulmonary disease, unspecified: Secondary | ICD-10-CM | POA: Diagnosis not present

## 2020-11-02 DIAGNOSIS — F32A Depression, unspecified: Secondary | ICD-10-CM | POA: Diagnosis not present

## 2020-11-07 DIAGNOSIS — I4891 Unspecified atrial fibrillation: Secondary | ICD-10-CM | POA: Diagnosis not present

## 2020-11-07 DIAGNOSIS — I5022 Chronic systolic (congestive) heart failure: Secondary | ICD-10-CM | POA: Diagnosis not present

## 2020-11-07 DIAGNOSIS — G629 Polyneuropathy, unspecified: Secondary | ICD-10-CM | POA: Diagnosis not present

## 2020-11-07 DIAGNOSIS — I69393 Ataxia following cerebral infarction: Secondary | ICD-10-CM | POA: Diagnosis not present

## 2020-11-07 DIAGNOSIS — I110 Hypertensive heart disease with heart failure: Secondary | ICD-10-CM | POA: Diagnosis not present

## 2020-11-07 DIAGNOSIS — I2510 Atherosclerotic heart disease of native coronary artery without angina pectoris: Secondary | ICD-10-CM | POA: Diagnosis not present

## 2020-11-07 DIAGNOSIS — I69351 Hemiplegia and hemiparesis following cerebral infarction affecting right dominant side: Secondary | ICD-10-CM | POA: Diagnosis not present

## 2020-11-07 DIAGNOSIS — J449 Chronic obstructive pulmonary disease, unspecified: Secondary | ICD-10-CM | POA: Diagnosis not present

## 2020-11-07 DIAGNOSIS — F32A Depression, unspecified: Secondary | ICD-10-CM | POA: Diagnosis not present

## 2020-11-08 DIAGNOSIS — I5022 Chronic systolic (congestive) heart failure: Secondary | ICD-10-CM | POA: Diagnosis not present

## 2020-11-08 DIAGNOSIS — I2510 Atherosclerotic heart disease of native coronary artery without angina pectoris: Secondary | ICD-10-CM | POA: Diagnosis not present

## 2020-11-08 DIAGNOSIS — G629 Polyneuropathy, unspecified: Secondary | ICD-10-CM | POA: Diagnosis not present

## 2020-11-08 DIAGNOSIS — I4891 Unspecified atrial fibrillation: Secondary | ICD-10-CM | POA: Diagnosis not present

## 2020-11-08 DIAGNOSIS — I69351 Hemiplegia and hemiparesis following cerebral infarction affecting right dominant side: Secondary | ICD-10-CM | POA: Diagnosis not present

## 2020-11-08 DIAGNOSIS — J449 Chronic obstructive pulmonary disease, unspecified: Secondary | ICD-10-CM | POA: Diagnosis not present

## 2020-11-08 DIAGNOSIS — I69393 Ataxia following cerebral infarction: Secondary | ICD-10-CM | POA: Diagnosis not present

## 2020-11-08 DIAGNOSIS — I110 Hypertensive heart disease with heart failure: Secondary | ICD-10-CM | POA: Diagnosis not present

## 2020-11-08 DIAGNOSIS — F32A Depression, unspecified: Secondary | ICD-10-CM | POA: Diagnosis not present

## 2020-11-09 DIAGNOSIS — I69393 Ataxia following cerebral infarction: Secondary | ICD-10-CM | POA: Diagnosis not present

## 2020-11-09 DIAGNOSIS — I4891 Unspecified atrial fibrillation: Secondary | ICD-10-CM | POA: Diagnosis not present

## 2020-11-09 DIAGNOSIS — I5022 Chronic systolic (congestive) heart failure: Secondary | ICD-10-CM | POA: Diagnosis not present

## 2020-11-09 DIAGNOSIS — J449 Chronic obstructive pulmonary disease, unspecified: Secondary | ICD-10-CM | POA: Diagnosis not present

## 2020-11-09 DIAGNOSIS — I2510 Atherosclerotic heart disease of native coronary artery without angina pectoris: Secondary | ICD-10-CM | POA: Diagnosis not present

## 2020-11-09 DIAGNOSIS — F32A Depression, unspecified: Secondary | ICD-10-CM | POA: Diagnosis not present

## 2020-11-09 DIAGNOSIS — I110 Hypertensive heart disease with heart failure: Secondary | ICD-10-CM | POA: Diagnosis not present

## 2020-11-09 DIAGNOSIS — I69351 Hemiplegia and hemiparesis following cerebral infarction affecting right dominant side: Secondary | ICD-10-CM | POA: Diagnosis not present

## 2020-11-09 DIAGNOSIS — G629 Polyneuropathy, unspecified: Secondary | ICD-10-CM | POA: Diagnosis not present

## 2020-11-15 DIAGNOSIS — I4891 Unspecified atrial fibrillation: Secondary | ICD-10-CM | POA: Diagnosis not present

## 2020-11-15 DIAGNOSIS — I69393 Ataxia following cerebral infarction: Secondary | ICD-10-CM | POA: Diagnosis not present

## 2020-11-15 DIAGNOSIS — I2510 Atherosclerotic heart disease of native coronary artery without angina pectoris: Secondary | ICD-10-CM | POA: Diagnosis not present

## 2020-11-15 DIAGNOSIS — F32A Depression, unspecified: Secondary | ICD-10-CM | POA: Diagnosis not present

## 2020-11-15 DIAGNOSIS — J449 Chronic obstructive pulmonary disease, unspecified: Secondary | ICD-10-CM | POA: Diagnosis not present

## 2020-11-15 DIAGNOSIS — G629 Polyneuropathy, unspecified: Secondary | ICD-10-CM | POA: Diagnosis not present

## 2020-11-15 DIAGNOSIS — I5022 Chronic systolic (congestive) heart failure: Secondary | ICD-10-CM | POA: Diagnosis not present

## 2020-11-15 DIAGNOSIS — I110 Hypertensive heart disease with heart failure: Secondary | ICD-10-CM | POA: Diagnosis not present

## 2020-11-15 DIAGNOSIS — I69351 Hemiplegia and hemiparesis following cerebral infarction affecting right dominant side: Secondary | ICD-10-CM | POA: Diagnosis not present

## 2020-11-23 ENCOUNTER — Emergency Department
Admission: EM | Admit: 2020-11-23 | Discharge: 2020-11-23 | Disposition: A | Discharge status: Home / Self Care | Payer: Medicare HMO | Attending: Emergency Medicine | Admitting: Emergency Medicine

## 2020-11-23 ENCOUNTER — Emergency Department: Payer: Medicare HMO

## 2020-11-23 ENCOUNTER — Encounter: Payer: Self-pay | Admitting: Emergency Medicine

## 2020-11-23 ENCOUNTER — Other Ambulatory Visit: Payer: Self-pay

## 2020-11-23 DIAGNOSIS — J449 Chronic obstructive pulmonary disease, unspecified: Secondary | ICD-10-CM | POA: Insufficient documentation

## 2020-11-23 DIAGNOSIS — I2510 Atherosclerotic heart disease of native coronary artery without angina pectoris: Secondary | ICD-10-CM | POA: Diagnosis not present

## 2020-11-23 DIAGNOSIS — Z96651 Presence of right artificial knee joint: Secondary | ICD-10-CM | POA: Insufficient documentation

## 2020-11-23 DIAGNOSIS — Z87891 Personal history of nicotine dependence: Secondary | ICD-10-CM | POA: Diagnosis not present

## 2020-11-23 DIAGNOSIS — Z7982 Long term (current) use of aspirin: Secondary | ICD-10-CM | POA: Insufficient documentation

## 2020-11-23 DIAGNOSIS — R410 Disorientation, unspecified: Secondary | ICD-10-CM | POA: Diagnosis not present

## 2020-11-23 DIAGNOSIS — Z79899 Other long term (current) drug therapy: Secondary | ICD-10-CM | POA: Insufficient documentation

## 2020-11-23 DIAGNOSIS — W07XXXA Fall from chair, initial encounter: Secondary | ICD-10-CM | POA: Insufficient documentation

## 2020-11-23 DIAGNOSIS — I10 Essential (primary) hypertension: Secondary | ICD-10-CM | POA: Diagnosis not present

## 2020-11-23 DIAGNOSIS — R1032 Left lower quadrant pain: Secondary | ICD-10-CM | POA: Diagnosis not present

## 2020-11-23 DIAGNOSIS — R29818 Other symptoms and signs involving the nervous system: Secondary | ICD-10-CM | POA: Diagnosis not present

## 2020-11-23 DIAGNOSIS — Z85828 Personal history of other malignant neoplasm of skin: Secondary | ICD-10-CM | POA: Diagnosis not present

## 2020-11-23 DIAGNOSIS — W19XXXA Unspecified fall, initial encounter: Secondary | ICD-10-CM

## 2020-11-23 DIAGNOSIS — I5022 Chronic systolic (congestive) heart failure: Secondary | ICD-10-CM | POA: Insufficient documentation

## 2020-11-23 DIAGNOSIS — I714 Abdominal aortic aneurysm, without rupture: Secondary | ICD-10-CM | POA: Diagnosis not present

## 2020-11-23 DIAGNOSIS — K469 Unspecified abdominal hernia without obstruction or gangrene: Secondary | ICD-10-CM | POA: Diagnosis not present

## 2020-11-23 DIAGNOSIS — Z8673 Personal history of transient ischemic attack (TIA), and cerebral infarction without residual deficits: Secondary | ICD-10-CM | POA: Insufficient documentation

## 2020-11-23 DIAGNOSIS — Y92009 Unspecified place in unspecified non-institutional (private) residence as the place of occurrence of the external cause: Secondary | ICD-10-CM | POA: Diagnosis not present

## 2020-11-23 DIAGNOSIS — K439 Ventral hernia without obstruction or gangrene: Secondary | ICD-10-CM | POA: Diagnosis not present

## 2020-11-23 DIAGNOSIS — R202 Paresthesia of skin: Secondary | ICD-10-CM | POA: Diagnosis not present

## 2020-11-23 DIAGNOSIS — J45909 Unspecified asthma, uncomplicated: Secondary | ICD-10-CM | POA: Diagnosis not present

## 2020-11-23 DIAGNOSIS — I110 Hypertensive heart disease with heart failure: Secondary | ICD-10-CM | POA: Diagnosis not present

## 2020-11-23 DIAGNOSIS — R200 Anesthesia of skin: Secondary | ICD-10-CM

## 2020-11-23 DIAGNOSIS — I712 Thoracic aortic aneurysm, without rupture: Secondary | ICD-10-CM | POA: Diagnosis not present

## 2020-11-23 DIAGNOSIS — Z7951 Long term (current) use of inhaled steroids: Secondary | ICD-10-CM | POA: Insufficient documentation

## 2020-11-23 DIAGNOSIS — R4182 Altered mental status, unspecified: Secondary | ICD-10-CM | POA: Diagnosis not present

## 2020-11-23 LAB — URINALYSIS, COMPLETE (UACMP) WITH MICROSCOPIC
Bacteria, UA: NONE SEEN
Bilirubin Urine: NEGATIVE
Glucose, UA: NEGATIVE mg/dL
Hgb urine dipstick: NEGATIVE
Ketones, ur: 5 mg/dL — AB
Leukocytes,Ua: NEGATIVE
Nitrite: NEGATIVE
Protein, ur: NEGATIVE mg/dL
Specific Gravity, Urine: 1.012 (ref 1.005–1.030)
Squamous Epithelial / HPF: NONE SEEN (ref 0–5)
pH: 8 (ref 5.0–8.0)

## 2020-11-23 LAB — BASIC METABOLIC PANEL
Anion gap: 17 — ABNORMAL HIGH (ref 5–15)
Anion gap: 12 (ref 5–15)
BUN: 10 mg/dL (ref 8–23)
BUN: 9 mg/dL (ref 8–23)
CO2: 16 mmol/L — ABNORMAL LOW (ref 22–32)
CO2: 25 mmol/L (ref 22–32)
Calcium: 9 mg/dL (ref 8.9–10.3)
Calcium: 8.4 mg/dL — ABNORMAL LOW (ref 8.9–10.3)
Chloride: 110 mmol/L (ref 98–111)
Chloride: 109 mmol/L (ref 98–111)
Creatinine, Ser: 0.8 mg/dL (ref 0.61–1.24)
Creatinine, Ser: 0.74 mg/dL (ref 0.61–1.24)
GFR, Estimated: >60 mL/min (ref >60)
GFR, Estimated: >60 mL/min (ref >60)
Glucose, Bld: 110 mg/dL — ABNORMAL HIGH (ref 70–99)
Glucose, Bld: 109 mg/dL — ABNORMAL HIGH (ref 70–99)
Potassium: 4.1 mmol/L (ref 3.5–5.1)
Potassium: 3.2 mmol/L — ABNORMAL LOW (ref 3.5–5.1)
Sodium: 143 mmol/L (ref 135–145)
Sodium: 146 mmol/L — ABNORMAL HIGH (ref 135–145)

## 2020-11-23 LAB — CBC
HCT: 44.9 % (ref 39.0–52.0)
Hemoglobin: 15.4 g/dL (ref 13.0–17.0)
MCH: 33.4 pg (ref 26.0–34.0)
MCHC: 34.3 g/dL (ref 30.0–36.0)
MCV: 97.4 fL (ref 80.0–100.0)
Platelets: 227 10*3/uL (ref 150–400)
RBC: 4.61 MIL/uL (ref 4.22–5.81)
RDW: 13.2 % (ref 11.5–15.5)
WBC: 10.5 10*3/uL (ref 4.0–10.5)
nRBC: 0 % (ref 0.0–0.2)

## 2020-11-23 LAB — TROPONIN I (HIGH SENSITIVITY)
Troponin I (High Sensitivity): 22 ng/L — ABNORMAL HIGH (ref <18)
Troponin I (High Sensitivity): 22 ng/L — ABNORMAL HIGH (ref <18)

## 2020-11-23 MED ORDER — SODIUM CHLORIDE 0.9 % IV BOLUS
500.0000 mL | Freq: Once | INTRAVENOUS | Status: AC
  Administered 2020-11-23: 500 mL via INTRAVENOUS

## 2020-11-23 MED ORDER — SODIUM CHLORIDE 0.9 % IV BOLUS: 1000.0000 mL | Freq: Once | INTRAVENOUS | Status: DC

## 2020-11-23 NOTE — Discharge Instructions (Signed)
Your CT scan showed no signs of new stroke.  The aortic aneurysm has increased in size since 2015, and should be rechecked in 1 year.  There is a small hernia in the stomach on the left.  You should return to the ER for new, worsening, or persistent severe pain, numbness or weakness, recurrent falls, or any other new or worsening symptoms that concern you.

## 2020-11-23 NOTE — ED Triage Notes (Addendum)
Presents via EMS with numbness to both legs  Denies any fall  Per EMS the son  States he was confused this am  But is alert at present    Pt has a CVA hx   States he has had some weakness to right side

## 2020-11-23 NOTE — ED Provider Notes (Signed)
[facility] Emergency Department Provider Note ____________________________________________   Event Date/Time   First MD Initiated Contact with Patient 11/23/20 1052     (approximate)  I have reviewed the triage vital signs and the nursing notes.   HISTORY  Chief Complaint Numbness  Level 5 caveat: History present illness limited due to disorganized historian   HPI Darrell Jacobson is a 87 y.o. male with PMH as noted below who presents from his house after an apparent fall.  The patient states that his legs gave out on him and went numb.  He states that this was in both legs.  He reports chronic problems getting around due to weakness from an old stroke.  He denies any acute pain.  Past Medical History:  Diagnosis Date   Arthritis    Asthma    COPD with inhaler use   Cancer (HCC) 2013   Basal Cell Skin CA resected from scalp.   Carotid stenosis    pateint states he has Left carotid blockage worse than Right.    CHF (congestive heart failure) (HCC)    Collagen vascular disease (HCC)    COPD (chronic obstructive pulmonary disease) (HCC)    Coronary artery disease    Dyspnea    GERD (gastroesophageal reflux disease)    Glaucoma    History of hiatal hernia    History of shingles 2012   forehead   Hypertension    Multilevel degenerative disc disease    Neuropathy    legs and feet   Pacemaker 2006    Patient Active Problem List   Diagnosis Date Noted   Right-sided chest pain    Community acquired pneumonia 09/30/2019   Essential hypertension 09/30/2019   Chronic systolic CHF (congestive heart failure) (HCC) 09/30/2019   Complete heart block (HCC) 09/30/2019   History of cerebrovascular disease 09/30/2019   PVD (peripheral vascular disease) (HCC) 09/30/2019   Chronic obstructive pulmonary disease (HCC) 09/30/2019   Sepsis with acute renal failure without septic shock (HCC)    Lobar pneumonia (HCC)     Hyperlipidemia    TIA (transient ischemic attack) 05/03/2019    Past Surgical History:  Procedure Laterality Date   BACK SURGERY     2 lumbar, 1 thoracic   CATARACT EXTRACTION W/PHACO Right 12/24/2016   Procedure: CATARACT EXTRACTION PHACO AND INTRAOCULAR LENS PLACEMENT (IOC)  Right  complicated;  Surgeon: Lockie Mola, MD;  Location: [facility];  Service: Ophthalmology;  Laterality: Right;  Malyugin   JOINT REPLACEMENT Right    knee   SKIN DEBRIDEMENT  06/13/2006   facial   SKIN GRAFT Right 07/11/2016   facial    Prior to Admission medications   Medication Sig Start Date End Date Taking? Authorizing Provider  albuterol (PROAIR HFA) 108 (90 BASE) MCG/ACT inhaler Inhale 2 puffs into the lungs every 6 (six) hours as needed. 01/25/15 09/30/19  [provider]  aspirin EC 81 MG tablet Take 1 tablet by mouth daily.    [provider]  bimatoprost (LUMIGAN) 0.01 % SOLN Apply 1 Dose to eye at bedtime. 01/16/14   [provider]  BREO ELLIPTA 100-25 MCG/INH AEPB Inhale 1 puff into the lungs daily. 09/23/19   [provider]  Cholecalciferol (D 2000) 2000 UNITS TABS Take 1 tablet by mouth daily.    [provider]  doxazosin (CARDURA) 8 MG tablet Take 1 tablet by mouth at bedtime. 06/21/15   [provider]  feeding supplement, ENSURE ENLIVE, (ENSURE ENLIVE)  LIQD Take 237 mLs by mouth 2 (two) times daily between meals. 10/02/19   Loletha Grayer, MD  Ferrous Sulfate (SLOW FE PO) Take by mouth.    [provider]  LORazepam (ATIVAN) 0.5 MG tablet Take 1 tablet (0.5 mg total) by mouth 2 (two) times daily as needed for anxiety. Patient taking differently: Take 0.5 mg by mouth 3 (three) times daily.  05/04/19   Hillary Bow, MD  montelukast (SINGULAIR) 10 MG tablet Take 1 tablet by mouth at bedtime. 01/15/15   [provider]  Multiple Vitamin (MULTI-VITAMINS) TABS Take 1 tablet by mouth daily.    [provider]  nebivolol (BYSTOLIC) 2.5 MG tablet Take 1 tablet by mouth 3 (three) times daily. 06/26/15   [provider]  omeprazole (PRILOSEC) 20 MG capsule Take 1 capsule by mouth daily. 03/19/15   [provider]  prednisoLONE acetate (PRED FORTE) 1 % ophthalmic suspension Place 1 drop into the right eye daily. 12/31/18   [provider]  valACYclovir (VALTREX) 500 MG tablet Take 500 mg by mouth daily. 06/23/19   [provider]    Allergies Prednisone, Acyclovir, Albuterol sulfate, Amlodipine, Brinzolamide, Budesonide-formoterol fumarate, Buspirone, Cefdinir, Celecoxib, Chlorthalidone, Clopidogrel, Codeine, Diazepam, Dicyclomine, Duloxetine, Famciclovir, Felodipine, Furosemide, Gabapentin, Hydrochlorothiazide, Indomethacin, Lisinopril, Losartan, Meclizine, Meloxicam, Metaxalone, Metoprolol tartrate, Morphine, Nabumetone, Naproxen, Olmesartan, Oxycodone, Penicillin v potassium, Prasugrel, Promethazine, Sulfa antibiotics, Torsemide, Tramadol, and Zolpidem  No family history on file.  Social History Social History   Tobacco Use   Smoking status: Former Smoker    Packs/day: 1.50    Years: 38.00    Pack years: 57.00    Types: Cigarettes    Quit date: 11/03/1984    Years since quitting: 36.0   Smokeless tobacco: Never Used  Substance Use Topics   Alcohol use: Yes    Alcohol/week: 14.0 standard drinks    Types: 14 Cans of beer per week   Drug use: Not Currently    Review of Systems Level 5 caveat: Review of systems limited due to disorganized historian   Cardiovascular: Denies chest pain. Respiratory: Denies shortness of breath. Gastrointestinal: No vomiting. Musculoskeletal: Negative for back pain. Neurological: Negative for headaches.  Positive for resolved bilateral leg numbness.   ____________________________________________   PHYSICAL EXAM:  VITAL SIGNS: ED Triage Vitals  Enc Vitals Group     BP 11/23/20 0902 (!) 140/54      Pulse Rate 11/23/20 0902 80     Resp 11/23/20 0902 18     Temp 11/23/20 0902 (!) 97.5 F (36.4 C)     Temp Source 11/23/20 0902 Oral     SpO2 11/23/20 0902 95 %     Weight 11/23/20 0900 178 lb 9.2 oz (81 kg)     Height 11/23/20 0900 5\' 6"  (1.676 m)     Head Circumference --      Peak Flow --      Pain Score 11/23/20 0903 0     Pain Loc --      Pain Edu? --      Excl. in [place]? --     Constitutional: Alert and oriented.  Somewhat frail-appearing but in no acute distress. Eyes: Conjunctivae are normal.  Head: Atraumatic. Nose: No congestion/rhinnorhea. Mouth/Throat: Mucous membranes are dry. Neck: Normal range of motion.  Cardiovascular: Normal rate, regular rhythm. Grossly normal heart sounds.  Good peripheral circulation. Respiratory: Normal respiratory effort.  No retractions. Lungs CTAB. Gastrointestinal: Soft and nontender. No distention.  Genitourinary: No flank tenderness. Musculoskeletal: No  lower extremity edema.  Extremities warm and well perfused.  Neurologic:  Normal speech and language.  Left upper extremity ataxia which patient states is chronic from old stroke.  5/5 motor strength and intact sensation of bilateral lower extremities.  No pronator drift.  No facial droop. Skin:  Skin is warm and dry. No rash noted. Psychiatric: Mood and affect are normal. Speech and behavior are normal.  ____________________________________________   LABS (all labs ordered are listed, but only abnormal results are displayed)  Labs Reviewed  BASIC METABOLIC PANEL - Abnormal; Notable for the following components:      Result Value   CO2 16 (*)    Glucose, Bld 110 (*)    Anion gap 17 (*)    All other components within normal limits  URINALYSIS, COMPLETE (UACMP) WITH MICROSCOPIC - Abnormal; Notable for the following components:   Color, Urine YELLOW (*)    APPearance CLEAR (*)    Ketones, ur 5 (*)    All other components within normal limits  TROPONIN I (HIGH SENSITIVITY) -  Abnormal; Notable for the following components:   Troponin I (High Sensitivity) 22 (*)    All other components within normal limits  TROPONIN I (HIGH SENSITIVITY) - Abnormal; Notable for the following components:   Troponin I (High Sensitivity) 22 (*)    All other components within normal limits  CBC  BASIC METABOLIC PANEL  CBG MONITORING, ED   ____________________________________________  EKG  ED ECG REPORT I, Dionne Bucy, the attending physician, personally viewed and interpreted this ECG.  Date: 11/23/2020 EKG Time: 0903 Rate: 77 Rhythm: Ventricular paced rhythm ST/T Wave abnormalities: normal Narrative Interpretation: no evidence of acute ischemia  ____________________________________________  RADIOLOGY  CT head: No ICH or acute infarct CT abdomen/pelvis: Enlarged infrarenal AAA when compared to 2015.  Left ventral abdominal wall hernia with no evidence of obstruction.  ____________________________________________   PROCEDURES  Procedure(s) performed: No  Procedures  Critical Care performed: No ____________________________________________   INITIAL IMPRESSION / ASSESSMENT AND PLAN / ED COURSE  Pertinent labs & imaging results that were available during my care of the patient were reviewed by me and considered in my medical decision making (see chart for details).  87 year old male with PMH as noted above presents after an apparent fall.  He states he felt like his legs gave out and were both numb although this seems to have improved.  He is a somewhat disorganized historian.  I obtained further history from the son who confirms that the patient normally gets around in a wheelchair.  He states that the patient apparently fell out of the chair onto the ground.  He reports that the family is working on getting the patient placed in a rehab or nursing facility.  On exam the patient is somewhat frail appearing but in no acute distress.  His vital signs are  normal.  He has left upper extremity ataxia from a prior stroke but no other focal neurologic deficits, in no acute focal neurologic findings.  He is moving both extremities readily and has no numbness in any specific distribution.  Mucous membranes are somewhat dry.  Exam is otherwise as described above.  Overall suspect most likely mechanical fall although per the son the patient was somewhat confused earlier.  We will obtain a CT head to rule out acute stroke, lab work-up, urinalysis, and reassess.  ----------------------------------------- 3:35 PM on 11/23/2020 -----------------------------------------  The initial labs are unremarkable except for slightly elevated anion gap of unclear etiology.  The there is no evidence of sepsis, DKA, or other metabolic derangement.  Initial and repeat troponin were both around 20 which appears baseline for the patient.  Urinalysis is negative.  CT head shows no acute findings.  On reassessment the patient started to complain of some left lower quadrant abdominal pain and he had some mild tenderness there so I obtained a CT without contrast.  This shows a left ventral hernia containing a bowel loop but with no signs of obstruction.  He has a slightly enlarged aneurysm when compared to 2015 but no severe pain, hypertension, or other signs or symptoms to suggest rupture or other acute complication.  I had an extensive discussion with the son over the phone.  He agrees that if the work-up is negative, the patient is safe to be at home although family works on placement.  The patient received some fluids and plan is to repeat the BMP.  If anion gap improves I anticipate discharge home.  I signed the patient out to the oncoming physician Dr. Corky Downs.  ____________________________________________   FINAL CLINICAL IMPRESSION(S) / ED DIAGNOSES  Final diagnoses:  Fall, initial encounter  Numbness      NEW MEDICATIONS STARTED DURING THIS VISIT:  New  Prescriptions   No medications on file     Note:  This document was prepared using Dragon voice recognition software and may include unintentional dictation errors.    Arta Silence, MD 11/23/20 1539

## 2020-11-26 DIAGNOSIS — Z111 Encounter for screening for respiratory tuberculosis: Secondary | ICD-10-CM | POA: Diagnosis not present

## 2020-11-26 DIAGNOSIS — H401123 Primary open-angle glaucoma, left eye, severe stage: Secondary | ICD-10-CM | POA: Diagnosis not present

## 2020-11-28 DIAGNOSIS — I69351 Hemiplegia and hemiparesis following cerebral infarction affecting right dominant side: Secondary | ICD-10-CM | POA: Diagnosis not present

## 2020-11-28 DIAGNOSIS — F32A Depression, unspecified: Secondary | ICD-10-CM | POA: Diagnosis not present

## 2020-11-28 DIAGNOSIS — G629 Polyneuropathy, unspecified: Secondary | ICD-10-CM | POA: Diagnosis not present

## 2020-11-28 DIAGNOSIS — I2510 Atherosclerotic heart disease of native coronary artery without angina pectoris: Secondary | ICD-10-CM | POA: Diagnosis not present

## 2020-11-28 DIAGNOSIS — I69393 Ataxia following cerebral infarction: Secondary | ICD-10-CM | POA: Diagnosis not present

## 2020-11-28 DIAGNOSIS — I110 Hypertensive heart disease with heart failure: Secondary | ICD-10-CM | POA: Diagnosis not present

## 2020-11-28 DIAGNOSIS — J449 Chronic obstructive pulmonary disease, unspecified: Secondary | ICD-10-CM | POA: Diagnosis not present

## 2020-11-28 DIAGNOSIS — I4891 Unspecified atrial fibrillation: Secondary | ICD-10-CM | POA: Diagnosis not present

## 2020-11-28 DIAGNOSIS — I5022 Chronic systolic (congestive) heart failure: Secondary | ICD-10-CM | POA: Diagnosis not present

## 2020-11-29 DIAGNOSIS — I110 Hypertensive heart disease with heart failure: Secondary | ICD-10-CM | POA: Diagnosis not present

## 2020-11-29 DIAGNOSIS — I69351 Hemiplegia and hemiparesis following cerebral infarction affecting right dominant side: Secondary | ICD-10-CM | POA: Diagnosis not present

## 2020-11-29 DIAGNOSIS — I10 Essential (primary) hypertension: Secondary | ICD-10-CM | POA: Diagnosis not present

## 2020-11-29 DIAGNOSIS — F32A Depression, unspecified: Secondary | ICD-10-CM | POA: Diagnosis not present

## 2020-11-29 DIAGNOSIS — I69393 Ataxia following cerebral infarction: Secondary | ICD-10-CM | POA: Diagnosis not present

## 2020-11-29 DIAGNOSIS — I4891 Unspecified atrial fibrillation: Secondary | ICD-10-CM | POA: Diagnosis not present

## 2020-11-29 DIAGNOSIS — J432 Centrilobular emphysema: Secondary | ICD-10-CM | POA: Diagnosis not present

## 2020-11-29 DIAGNOSIS — W19XXXD Unspecified fall, subsequent encounter: Secondary | ICD-10-CM | POA: Diagnosis not present

## 2020-11-29 DIAGNOSIS — G309 Alzheimer's disease, unspecified: Secondary | ICD-10-CM | POA: Diagnosis not present

## 2020-11-29 DIAGNOSIS — F0280 Dementia in other diseases classified elsewhere without behavioral disturbance: Secondary | ICD-10-CM | POA: Diagnosis not present

## 2020-11-29 DIAGNOSIS — J449 Chronic obstructive pulmonary disease, unspecified: Secondary | ICD-10-CM | POA: Diagnosis not present

## 2020-11-29 DIAGNOSIS — G629 Polyneuropathy, unspecified: Secondary | ICD-10-CM | POA: Diagnosis not present

## 2020-11-29 DIAGNOSIS — I5022 Chronic systolic (congestive) heart failure: Secondary | ICD-10-CM | POA: Diagnosis not present

## 2020-11-29 DIAGNOSIS — I2510 Atherosclerotic heart disease of native coronary artery without angina pectoris: Secondary | ICD-10-CM | POA: Diagnosis not present

## 2020-12-05 DIAGNOSIS — F32A Depression, unspecified: Secondary | ICD-10-CM | POA: Diagnosis not present

## 2020-12-05 DIAGNOSIS — I4891 Unspecified atrial fibrillation: Secondary | ICD-10-CM | POA: Diagnosis not present

## 2020-12-05 DIAGNOSIS — G629 Polyneuropathy, unspecified: Secondary | ICD-10-CM | POA: Diagnosis not present

## 2020-12-05 DIAGNOSIS — I2510 Atherosclerotic heart disease of native coronary artery without angina pectoris: Secondary | ICD-10-CM | POA: Diagnosis not present

## 2020-12-05 DIAGNOSIS — I110 Hypertensive heart disease with heart failure: Secondary | ICD-10-CM | POA: Diagnosis not present

## 2020-12-05 DIAGNOSIS — I5022 Chronic systolic (congestive) heart failure: Secondary | ICD-10-CM | POA: Diagnosis not present

## 2020-12-05 DIAGNOSIS — J449 Chronic obstructive pulmonary disease, unspecified: Secondary | ICD-10-CM | POA: Diagnosis not present

## 2020-12-05 DIAGNOSIS — I69351 Hemiplegia and hemiparesis following cerebral infarction affecting right dominant side: Secondary | ICD-10-CM | POA: Diagnosis not present

## 2020-12-05 DIAGNOSIS — I69393 Ataxia following cerebral infarction: Secondary | ICD-10-CM | POA: Diagnosis not present

## 2020-12-10 DIAGNOSIS — F32A Depression, unspecified: Secondary | ICD-10-CM | POA: Diagnosis not present

## 2020-12-10 DIAGNOSIS — I2510 Atherosclerotic heart disease of native coronary artery without angina pectoris: Secondary | ICD-10-CM | POA: Diagnosis not present

## 2020-12-10 DIAGNOSIS — I4891 Unspecified atrial fibrillation: Secondary | ICD-10-CM | POA: Diagnosis not present

## 2020-12-10 DIAGNOSIS — I69393 Ataxia following cerebral infarction: Secondary | ICD-10-CM | POA: Diagnosis not present

## 2020-12-10 DIAGNOSIS — J449 Chronic obstructive pulmonary disease, unspecified: Secondary | ICD-10-CM | POA: Diagnosis not present

## 2020-12-10 DIAGNOSIS — I5022 Chronic systolic (congestive) heart failure: Secondary | ICD-10-CM | POA: Diagnosis not present

## 2020-12-10 DIAGNOSIS — G629 Polyneuropathy, unspecified: Secondary | ICD-10-CM | POA: Diagnosis not present

## 2020-12-10 DIAGNOSIS — I69351 Hemiplegia and hemiparesis following cerebral infarction affecting right dominant side: Secondary | ICD-10-CM | POA: Diagnosis not present

## 2020-12-10 DIAGNOSIS — I110 Hypertensive heart disease with heart failure: Secondary | ICD-10-CM | POA: Diagnosis not present

## 2020-12-13 DIAGNOSIS — I509 Heart failure, unspecified: Secondary | ICD-10-CM | POA: Diagnosis not present

## 2020-12-13 DIAGNOSIS — H2512 Age-related nuclear cataract, left eye: Secondary | ICD-10-CM | POA: Diagnosis not present

## 2020-12-17 DIAGNOSIS — I4891 Unspecified atrial fibrillation: Secondary | ICD-10-CM | POA: Diagnosis not present

## 2020-12-17 DIAGNOSIS — I69393 Ataxia following cerebral infarction: Secondary | ICD-10-CM | POA: Diagnosis not present

## 2020-12-17 DIAGNOSIS — I110 Hypertensive heart disease with heart failure: Secondary | ICD-10-CM | POA: Diagnosis not present

## 2020-12-17 DIAGNOSIS — G629 Polyneuropathy, unspecified: Secondary | ICD-10-CM | POA: Diagnosis not present

## 2020-12-17 DIAGNOSIS — I2510 Atherosclerotic heart disease of native coronary artery without angina pectoris: Secondary | ICD-10-CM | POA: Diagnosis not present

## 2020-12-17 DIAGNOSIS — J449 Chronic obstructive pulmonary disease, unspecified: Secondary | ICD-10-CM | POA: Diagnosis not present

## 2020-12-17 DIAGNOSIS — I5022 Chronic systolic (congestive) heart failure: Secondary | ICD-10-CM | POA: Diagnosis not present

## 2020-12-17 DIAGNOSIS — F32A Depression, unspecified: Secondary | ICD-10-CM | POA: Diagnosis not present

## 2020-12-17 DIAGNOSIS — I69351 Hemiplegia and hemiparesis following cerebral infarction affecting right dominant side: Secondary | ICD-10-CM | POA: Diagnosis not present

## 2020-12-20 DIAGNOSIS — I69393 Ataxia following cerebral infarction: Secondary | ICD-10-CM | POA: Diagnosis not present

## 2020-12-20 DIAGNOSIS — I69351 Hemiplegia and hemiparesis following cerebral infarction affecting right dominant side: Secondary | ICD-10-CM | POA: Diagnosis not present

## 2020-12-20 DIAGNOSIS — I2510 Atherosclerotic heart disease of native coronary artery without angina pectoris: Secondary | ICD-10-CM | POA: Diagnosis not present

## 2020-12-20 DIAGNOSIS — J449 Chronic obstructive pulmonary disease, unspecified: Secondary | ICD-10-CM | POA: Diagnosis not present

## 2020-12-20 DIAGNOSIS — F32A Depression, unspecified: Secondary | ICD-10-CM | POA: Diagnosis not present

## 2020-12-20 DIAGNOSIS — I4891 Unspecified atrial fibrillation: Secondary | ICD-10-CM | POA: Diagnosis not present

## 2020-12-20 DIAGNOSIS — I5022 Chronic systolic (congestive) heart failure: Secondary | ICD-10-CM | POA: Diagnosis not present

## 2020-12-20 DIAGNOSIS — G629 Polyneuropathy, unspecified: Secondary | ICD-10-CM | POA: Diagnosis not present

## 2020-12-20 DIAGNOSIS — I110 Hypertensive heart disease with heart failure: Secondary | ICD-10-CM | POA: Diagnosis not present

## 2020-12-25 ENCOUNTER — Other Ambulatory Visit: Payer: Self-pay

## 2020-12-25 ENCOUNTER — Encounter: Payer: Self-pay | Admitting: Ophthalmology

## 2020-12-26 DIAGNOSIS — I5022 Chronic systolic (congestive) heart failure: Secondary | ICD-10-CM | POA: Diagnosis not present

## 2020-12-26 DIAGNOSIS — J449 Chronic obstructive pulmonary disease, unspecified: Secondary | ICD-10-CM | POA: Diagnosis not present

## 2020-12-26 DIAGNOSIS — I110 Hypertensive heart disease with heart failure: Secondary | ICD-10-CM | POA: Diagnosis not present

## 2020-12-26 DIAGNOSIS — I69393 Ataxia following cerebral infarction: Secondary | ICD-10-CM | POA: Diagnosis not present

## 2020-12-26 DIAGNOSIS — G629 Polyneuropathy, unspecified: Secondary | ICD-10-CM | POA: Diagnosis not present

## 2020-12-26 DIAGNOSIS — I4891 Unspecified atrial fibrillation: Secondary | ICD-10-CM | POA: Diagnosis not present

## 2020-12-26 DIAGNOSIS — I69351 Hemiplegia and hemiparesis following cerebral infarction affecting right dominant side: Secondary | ICD-10-CM | POA: Diagnosis not present

## 2020-12-26 DIAGNOSIS — F32A Depression, unspecified: Secondary | ICD-10-CM | POA: Diagnosis not present

## 2020-12-26 DIAGNOSIS — I2510 Atherosclerotic heart disease of native coronary artery without angina pectoris: Secondary | ICD-10-CM | POA: Diagnosis not present

## 2020-12-27 ENCOUNTER — Other Ambulatory Visit
Admission: RE | Admit: 2020-12-27 | Discharge: 2020-12-27 | Disposition: A | Discharge status: Home / Self Care | Payer: Medicare HMO | Source: Ambulatory Visit | Attending: Ophthalmology | Admitting: Ophthalmology

## 2020-12-27 ENCOUNTER — Other Ambulatory Visit: Payer: Self-pay

## 2020-12-27 DIAGNOSIS — R109 Unspecified abdominal pain: Secondary | ICD-10-CM | POA: Diagnosis not present

## 2020-12-27 DIAGNOSIS — Z20822 Contact with and (suspected) exposure to covid-19: Secondary | ICD-10-CM | POA: Diagnosis not present

## 2020-12-27 DIAGNOSIS — R1030 Lower abdominal pain, unspecified: Secondary | ICD-10-CM | POA: Diagnosis not present

## 2020-12-27 DIAGNOSIS — Z01812 Encounter for preprocedural laboratory examination: Secondary | ICD-10-CM | POA: Insufficient documentation

## 2020-12-27 DIAGNOSIS — R110 Nausea: Secondary | ICD-10-CM | POA: Diagnosis not present

## 2020-12-27 DIAGNOSIS — R300 Dysuria: Secondary | ICD-10-CM | POA: Diagnosis not present

## 2020-12-27 LAB — SARS CORONAVIRUS 2 (TAT 6-24 HRS): SARS Coronavirus 2: NEGATIVE

## 2020-12-27 NOTE — Discharge Instructions (Signed)

## 2020-12-30 IMAGING — CT CT ANGIO NECK
1 of 11 series · 5 of 35 positions shown · IV contrast (omnipaque)
Comparison: December 27, 2019.

CLINICAL DATA: Vision loss; History of ischemic left MCA stroke.

EXAM:
CT ANGIOGRAPHY HEAD AND NECK
TECHNIQUE: Multidetector CT imaging of the head and neck was performed using
the standard protocol during bolus administration of intravenous
contrast. Multiplanar CT image reconstructions and MIPs were
obtained to evaluate the vascular anatomy. Carotid stenosis
measurements (when applicable) are obtained utilizing NASCET
criteria, using the distal internal carotid diameter as the
denominator.
CONTRAST:  75mL OMNIPAQUE IOHEXOL 350 MG/ML SOLN

[Series 10: ax thin · axial · 0.44mm/px · z∈[-429,-201]mm · 5 of 364 slices shown]
[im 61/364  soft-tissue]
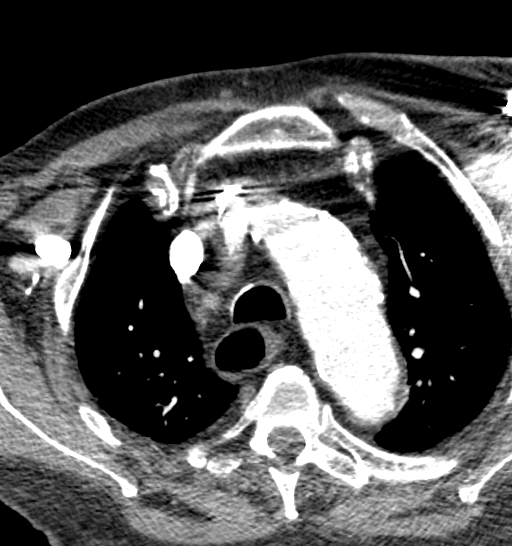
[im 122/364  bone]
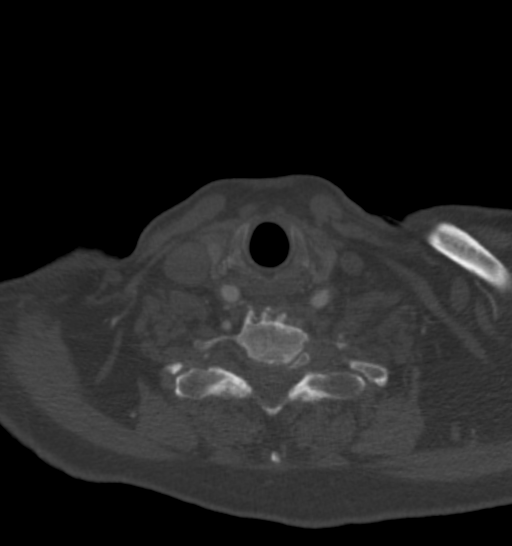
[im 182/364  soft-tissue]
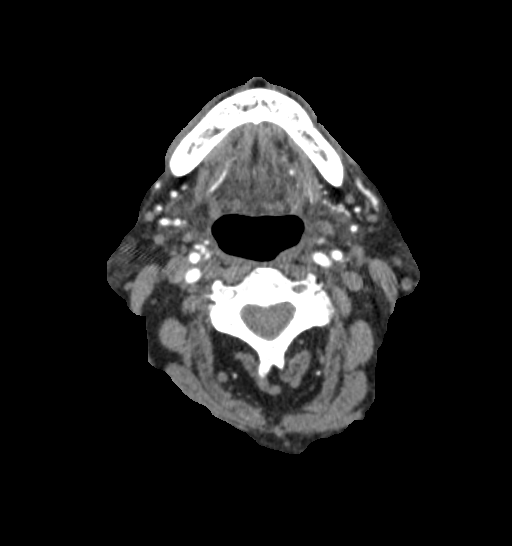
[im 243/364  bone]
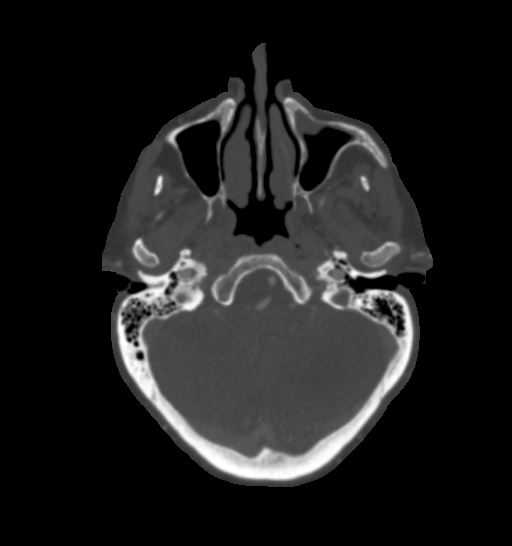
[im 303/364  soft-tissue]
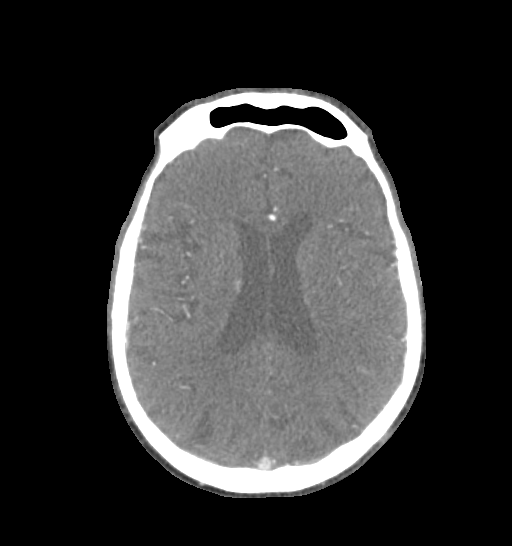

[5 of 35 positions shown; findings below may reference images not displayed]

FINDINGS: CT HEAD FINDINGS

Brain: No evidence of acute infarction, hemorrhage, hydrocephalus,
extra-axial collection or mass lesion/mass effect.

Remote infarcts in the left cerebellar hemisphere, bilateral
postcentral gyri, right occipital, lobe right caudate and thalamus.

Vascular: No hyperdense vessel. Densely calcified plaques in the
bilateral carotid siphons.

Skull: Normal. Negative for fracture or focal lesion.

Sinuses: Imaged portions are clear.

Orbits: Right lens surgery.  No acute orbital findings.

Review of the MIP images confirms the above findings

CTA NECK FINDINGS

Aortic arch: Standard branching. Imaged portion shows extensive
atherosclerotic changes and ectasia. No significant stenosis of the
major arch vessel origins.

Right carotid system: Diffuse atherosclerotic changes along the
right common carotid artery, carotid bulb and cervical segment of
the right internal carotid artery without hemodynamically
significant stenosis.

Left carotid system: Atherosclerotic changes along the left common
carotid artery, left carotid bifurcation and cervical left internal
carotid without hemodynamically significant stenosis.

Vertebral arteries: Atherosclerotic changes of the bilateral
subclavian arteries without hemodynamically significant stenosis.
The right vertebral artery has normal course and caliber. The left
vertebral artery is occluded at its origin with reconstitution at
the V4 segment likely from retrograde flow. Focal ectasia of the
left vertebral artery proximal to the origin of the left PICA with
decreased caliber after the PICA takeoff.

Skeleton: Advanced degenerative changes of the cervical spine with
ankylosis of the facet joints. No aggressive bone lesion identified.

Other neck: Negative

Upper chest: Negative

Review of the MIP images confirms the above findings

CTA HEAD FINDINGS

Anterior circulation: Heavily calcified plaques in bilateral carotid
siphons. No significant stenosis, proximal occlusion, aneurysm, or
vascular malformation.

Posterior circulation: A constitution of the left vertebral artery
at the V4 segment. Focal ectasia of the left vertebral artery
proximal to the origin of the left PICA with decreased caliber after
the PICA takeoff. The right vertebral artery has normal course and
caliber. The basilar artery in the bilateral posterior cerebral
arteries are maintained.

Venous sinuses: As permitted by contrast timing, patent.

Review of the MIP images confirms the above findings
IMPRESSION: 1. No acute intracranial abnormality.
2. Remote infarcts in the left cerebellar hemisphere, bilateral
postcentral gyri, right occipital lobe, right caudate and thalamus.
3. The left vertebral artery is occluded at its origin with
reconstitution at the V4 segment likely from retrograde flow.
4. Focal ectasia of the left vertebral artery proximal to the origin
of the left PICA with decreased caliber after the PICA takeoff.
5. The right vertebral artery has normal course and caliber.
6. Diffuse atherosclerotic changes of the bilateral common carotid,
carotid bulbs and cervical internal carotid arteries without
hemodynamically significant stenosis.

## 2020-12-31 ENCOUNTER — Other Ambulatory Visit: Payer: Self-pay

## 2020-12-31 ENCOUNTER — Ambulatory Visit: Payer: Medicare HMO | Admitting: Anesthesiology

## 2020-12-31 ENCOUNTER — Encounter
Admission: RE | Disposition: A | Discharge status: Home / Self Care | Payer: Self-pay | Source: Home / Self Care | Attending: Ophthalmology

## 2020-12-31 ENCOUNTER — Ambulatory Visit
Admission: RE | Admit: 2020-12-31 | Discharge: 2020-12-31 | Disposition: A | Discharge status: Home / Self Care | Payer: Medicare HMO | Attending: Ophthalmology | Admitting: Ophthalmology

## 2020-12-31 ENCOUNTER — Encounter: Payer: Self-pay | Admitting: Ophthalmology

## 2020-12-31 DIAGNOSIS — I4891 Unspecified atrial fibrillation: Secondary | ICD-10-CM | POA: Diagnosis not present

## 2020-12-31 DIAGNOSIS — H401123 Primary open-angle glaucoma, left eye, severe stage: Secondary | ICD-10-CM | POA: Diagnosis not present

## 2020-12-31 DIAGNOSIS — I69351 Hemiplegia and hemiparesis following cerebral infarction affecting right dominant side: Secondary | ICD-10-CM | POA: Diagnosis not present

## 2020-12-31 DIAGNOSIS — I110 Hypertensive heart disease with heart failure: Secondary | ICD-10-CM | POA: Diagnosis not present

## 2020-12-31 DIAGNOSIS — J449 Chronic obstructive pulmonary disease, unspecified: Secondary | ICD-10-CM | POA: Diagnosis not present

## 2020-12-31 DIAGNOSIS — Z885 Allergy status to narcotic agent status: Secondary | ICD-10-CM | POA: Diagnosis not present

## 2020-12-31 DIAGNOSIS — Z955 Presence of coronary angioplasty implant and graft: Secondary | ICD-10-CM | POA: Diagnosis not present

## 2020-12-31 DIAGNOSIS — Z96659 Presence of unspecified artificial knee joint: Secondary | ICD-10-CM | POA: Diagnosis not present

## 2020-12-31 DIAGNOSIS — Z87891 Personal history of nicotine dependence: Secondary | ICD-10-CM | POA: Insufficient documentation

## 2020-12-31 DIAGNOSIS — I69393 Ataxia following cerebral infarction: Secondary | ICD-10-CM | POA: Diagnosis not present

## 2020-12-31 DIAGNOSIS — Z886 Allergy status to analgesic agent status: Secondary | ICD-10-CM | POA: Diagnosis not present

## 2020-12-31 DIAGNOSIS — Z85828 Personal history of other malignant neoplasm of skin: Secondary | ICD-10-CM | POA: Diagnosis not present

## 2020-12-31 DIAGNOSIS — Z8673 Personal history of transient ischemic attack (TIA), and cerebral infarction without residual deficits: Secondary | ICD-10-CM | POA: Diagnosis not present

## 2020-12-31 DIAGNOSIS — Z950 Presence of cardiac pacemaker: Secondary | ICD-10-CM | POA: Diagnosis not present

## 2020-12-31 DIAGNOSIS — I2510 Atherosclerotic heart disease of native coronary artery without angina pectoris: Secondary | ICD-10-CM | POA: Diagnosis not present

## 2020-12-31 DIAGNOSIS — I5022 Chronic systolic (congestive) heart failure: Secondary | ICD-10-CM | POA: Diagnosis not present

## 2020-12-31 DIAGNOSIS — Z888 Allergy status to other drugs, medicaments and biological substances status: Secondary | ICD-10-CM | POA: Diagnosis not present

## 2020-12-31 DIAGNOSIS — Z79899 Other long term (current) drug therapy: Secondary | ICD-10-CM | POA: Insufficient documentation

## 2020-12-31 DIAGNOSIS — H2512 Age-related nuclear cataract, left eye: Secondary | ICD-10-CM | POA: Diagnosis not present

## 2020-12-31 DIAGNOSIS — Z881 Allergy status to other antibiotic agents status: Secondary | ICD-10-CM | POA: Insufficient documentation

## 2020-12-31 DIAGNOSIS — G629 Polyneuropathy, unspecified: Secondary | ICD-10-CM | POA: Diagnosis not present

## 2020-12-31 DIAGNOSIS — H25812 Combined forms of age-related cataract, left eye: Secondary | ICD-10-CM | POA: Diagnosis not present

## 2020-12-31 DIAGNOSIS — H401112 Primary open-angle glaucoma, right eye, moderate stage: Secondary | ICD-10-CM | POA: Diagnosis not present

## 2020-12-31 DIAGNOSIS — F32A Depression, unspecified: Secondary | ICD-10-CM | POA: Diagnosis not present

## 2020-12-31 HISTORY — DX: Unspecified dementia, unspecified severity, without behavioral disturbance, psychotic disturbance, mood disturbance, and anxiety: F03.90

## 2020-12-31 HISTORY — PX: CATARACT EXTRACTION W/PHACO: SHX586

## 2020-12-31 SURGERY — PHACOEMULSIFICATION, CATARACT, WITH IOL INSERTION
Anesthesia: Monitor Anesthesia Care | Site: Eye | Laterality: Left

## 2020-12-31 MED ORDER — TETRACAINE HCL 0.5 % OP SOLN
1.0000 [drp] | OPHTHALMIC | Status: DC | PRN
  Administered 2020-12-31 (×3): 1 [drp] via OPHTHALMIC

## 2020-12-31 MED ORDER — LIDOCAINE HCL (PF) 2 % IJ SOLN
INTRAOCULAR | Status: DC | PRN
  Administered 2020-12-31: 1 mL

## 2020-12-31 MED ORDER — MOXIFLOXACIN HCL 0.5 % OP SOLN
OPHTHALMIC | Status: DC | PRN
  Administered 2020-12-31: 0.2 mL via OPHTHALMIC

## 2020-12-31 MED ORDER — EPINEPHRINE PF 1 MG/ML IJ SOLN
INTRAOCULAR | Status: DC | PRN
  Administered 2020-12-31: 64 mL via OPHTHALMIC

## 2020-12-31 MED ORDER — ARMC OPHTHALMIC DILATING DROPS
1.0000 "application " | OPHTHALMIC | Status: DC | PRN
  Administered 2020-12-31 (×3): 1 via OPHTHALMIC

## 2020-12-31 MED ORDER — LACTATED RINGERS IV SOLN: INTRAVENOUS | Status: DC

## 2020-12-31 MED ORDER — TRYPAN BLUE 0.06 % OP SOLN
OPHTHALMIC | Status: DC | PRN
  Administered 2020-12-31: .1 mL via INTRAOCULAR

## 2020-12-31 MED ORDER — NA HYALUR & NA CHOND-NA HYALUR 0.4-0.35 ML IO KIT
PACK | INTRAOCULAR | Status: DC | PRN
  Administered 2020-12-31: 2 mL via INTRAOCULAR

## 2020-12-31 MED ORDER — FENTANYL CITRATE (PF) 100 MCG/2ML IJ SOLN
INTRAMUSCULAR | Status: DC | PRN
  Administered 2020-12-31: 50 ug via INTRAVENOUS

## 2020-12-31 SURGICAL SUPPLY — 26 items
BLADE DUAL KAHOOK SINGLE USE (BLADE) ×1 IMPLANT
CANNULA ANT/CHMB 27G (MISCELLANEOUS) ×1 IMPLANT
CANNULA ANT/CHMB 27GA (MISCELLANEOUS) ×2 IMPLANT
GLOVE SURG LX 7.5 STRW (GLOVE) ×1
GLOVE SURG LX STRL 7.5 STRW (GLOVE) ×1 IMPLANT
GLOVE SURG TRIUMPH 8.0 PF LTX (GLOVE) ×2 IMPLANT
GOWN STRL REUS W/ TWL LRG LVL3 (GOWN DISPOSABLE) ×2 IMPLANT
GOWN STRL REUS W/TWL LRG LVL3 (GOWN DISPOSABLE) ×4
ICLIP (OPHTHALMIC RELATED) ×1 IMPLANT
LENS IOL DIOP 18.0 (Intraocular Lens) ×2 IMPLANT
LENS IOL TECNIS MONO 18.0 (Intraocular Lens) IMPLANT
MARKER SKIN DUAL TIP RULER LAB (MISCELLANEOUS) ×2 IMPLANT
NDL CAPSULORHEX 25GA (NEEDLE) ×1 IMPLANT
NDL FILTER BLUNT 18X1 1/2 (NEEDLE) ×2 IMPLANT
NEEDLE CAPSULORHEX 25GA (NEEDLE) ×2 IMPLANT
NEEDLE FILTER BLUNT 18X 1/2SAF (NEEDLE) ×2
NEEDLE FILTER BLUNT 18X1 1/2 (NEEDLE) ×2 IMPLANT
PACK CATARACT BRASINGTON (MISCELLANEOUS) ×2 IMPLANT
PACK EYE AFTER SURG (MISCELLANEOUS) ×2 IMPLANT
PACK OPTHALMIC (MISCELLANEOUS) ×2 IMPLANT
RING MALYGIN 7.0 (MISCELLANEOUS) ×1 IMPLANT
SOLUTION OPHTHALMIC SALT (MISCELLANEOUS) ×2 IMPLANT
SYR 3ML LL SCALE MARK (SYRINGE) ×4 IMPLANT
SYR TB 1ML LUER SLIP (SYRINGE) ×2 IMPLANT
WATER STERILE IRR 250ML POUR (IV SOLUTION) ×2 IMPLANT
WIPE NON LINTING 3.25X3.25 (MISCELLANEOUS) ×2 IMPLANT

## 2020-12-31 NOTE — Anesthesia Procedure Notes (Signed)
Procedure Name: MAC Date/Time: 12/31/2020 10:27 AM Performed by: Silvana Newness, CRNA Pre-anesthesia Checklist: Patient identified, Emergency Drugs available, Suction available, Patient being monitored and Timeout performed Patient Re-evaluated:Patient Re-evaluated prior to induction Oxygen Delivery Method: Nasal cannula Placement Confirmation: positive ETCO2

## 2020-12-31 NOTE — Anesthesia Preprocedure Evaluation (Signed)
Anesthesia Evaluation  Patient identified by MRN, date of birth, ID band Patient awake    Reviewed: Allergy & Precautions, H&P , NPO status , Patient's Chart, lab work & pertinent test results  Airway Mallampati: II  TM Distance: >3 FB Neck ROM: full    Dental no notable dental hx.    Pulmonary asthma , COPD, former smoker,    Pulmonary exam normal        Cardiovascular hypertension, + CAD, + Peripheral Vascular Disease and +CHF  Normal cardiovascular exam+ pacemaker      Neuro/Psych TIAnegative neurological ROS     GI/Hepatic Neg liver ROS, GERD  Medicated,  Endo/Other  negative endocrine ROS  Renal/GU negative Renal ROS  negative genitourinary   Musculoskeletal  (+) Arthritis , Osteoarthritis,    Abdominal Normal abdominal exam  (+)   Peds  Hematology   Anesthesia Other Findings   Reproductive/Obstetrics                             Anesthesia Physical  Anesthesia Plan  ASA: III  Anesthesia Plan: MAC   Post-op Pain Management:    Induction: Intravenous  PONV Risk Score and Plan: 1 and TIVA, Midazolam and Treatment may vary due to age or medical condition  Airway Management Planned: Natural Airway  Additional Equipment: None  Intra-op Plan:   Post-operative Plan:   Informed Consent: I have reviewed the patients History and Physical, chart, labs and discussed the procedure including the risks, benefits and alternatives for the proposed anesthesia with the patient or authorized representative who has indicated his/her understanding and acceptance.     Dental advisory given  Plan Discussed with: CRNA  Anesthesia Plan Comments:         Anesthesia Quick Evaluation

## 2020-12-31 NOTE — Anesthesia Postprocedure Evaluation (Signed)
Anesthesia Post Note  Patient: DENZIL MCEACHRON  Procedure(s) Performed: CATARACT EXTRACTION PHACO AND INTRAOCULAR LENS PLACEMENT (IOC) LEFT [place] DUAL BLADE GONIOTOMY 9.21 01.05.2 14.1% (Left Eye)     Patient location during evaluation: PACU Anesthesia Type: MAC Level of consciousness: awake and alert Pain management: pain level controlled Vital Signs Assessment: post-procedure vital signs reviewed and stable Respiratory status: spontaneous breathing and nonlabored ventilation Cardiovascular status: blood pressure returned to baseline Postop Assessment: no apparent nausea or vomiting Anesthetic complications: no   No complications documented.  Lloyd Cullinan [employer]

## 2020-12-31 NOTE — H&P (Signed)
.   The History and Physical notes are on paper, have been signed, and are to be scanned. The patient remains stable and unchanged from the H&P.   Previous H&P reviewed, patient examined, and there are no changes.  The patient has a visually significant cataract interfering with his or her vision.  I attest that the following are true and accurate to the best of my knowledge: 1. The patient's impairment of visual function is believed not to be correctable with a tolerable change in glasses or contact lenses. 2. Cataract (in the operative eye) is believed to be significantly contributing to the patient's visual impairment. 3. The patient desires surgical correction; the risks, benefits, and alternatives have been explained, and questions have been answered to the patients satisfaction.  A reasonable expectation exists that cataract surgery will significantly improve both the visual and functional status of the patient.  Darrell Jacobson 12/31/2020 9:52 AM

## 2020-12-31 NOTE — Transfer of Care (Signed)
Immediate Anesthesia Transfer of Care Note  Patient: Darrell Jacobson  Procedure(s) Performed: CATARACT EXTRACTION PHACO AND INTRAOCULAR LENS PLACEMENT (IOC) LEFT [place] DUAL BLADE GONIOTOMY 9.21 01.05.2 14.1% (Left Eye)  Patient Location: PACU  Anesthesia Type: MAC  Level of Consciousness: awake, alert  and patient cooperative  Airway and Oxygen Therapy: Patient Spontanous Breathing and Patient connected to supplemental oxygen  Post-op Assessment: Post-op Vital signs reviewed, Patient's Cardiovascular Status Stable, Respiratory Function Stable, Patent Airway and No signs of Nausea or vomiting  Post-op Vital Signs: Reviewed and stable  Complications: No complications documented.

## 2020-12-31 NOTE — Op Note (Signed)
PREOPERATIVE DIAGNOSIS:  Nuclear sclerotic cataract left eye. H25.12  severe stage Primary Open Angle Glaucoma left eye H40.1123  POSTOPERATIVE DIAGNOSIS:    Nuclear sclerotic cataract left eye with miotic pupil   severe stage Primary Open Angle Glaucoma left eye H40.1123  PROCEDURE:  Phacoemusification with posterior chamber intraocular lens placement of the left eye using a Malyugin ring to enlarge the pupil [place] Dual Blade goniotomy left eye  Ultrasound time: Procedure(s): CATARACT EXTRACTION PHACO AND INTRAOCULAR LENS PLACEMENT (IOC) LEFT KAHOOK DUAL BLADE GONIOTOMY 9.21 01.05.2 14.1% (Left)  LENS:  Implant Name Type Inv. Item Serial No. Manufacturer Lot No. LRB No. Used Action  LENS IOL DIOP 18.0 - [id number] Intraocular Lens LENS IOL DIOP 18.0 [id number] JOHNSON   Left 1 Implanted    SURGEON:  Wyonia Hough, MD   ANESTHESIA:  Topical with tetracaine drops augmented with 1% preservative-free intracameral lidocaine.    COMPLICATIONS:  None.   DESCRIPTION OF PROCEDURE:  The patient was identified in the holding room and transported to the operating room and placed in the supine position under the operating microscope.  The left eye was identified as the operative eye and it was prepped and draped in the usual sterile ophthalmic fashion.   A 1 millimeter clear-corneal paracentesis was made at the 5:30 position.  0.5 ml of preservative-free 1% lidocaine was injected into the anterior chamber.  The anterior chamber was filled with Viscoat viscoelastic.  A 2.4 millimeter keratome was used to make a near-clear corneal incision at the 2:30 position. The microscope was adjusted and a gonioprism was used to visulaize the trabecular meshwork.  The [facility] Dual Blade was advanced across the anterior chamber under viscoelastic.  The blade was used to mark the trabecular meshwork at the 7:30 position.  The blade was placed two clock hours clockwise into the meshwork.  Proper postioning  was confirmed.  The blade was passed counterclockwise through the meshwork to excise approximately two to three clock-hours of trabecular meshwork. A Malyugin ring was inserted to to enlarge the 28mm pupil to 43mm,.  A curvilinear capsulorrhexis was made with a cystotome and capsulorrhexis forceps.  Balanced salt solution was used to hydrodissect and hydrodelineate the nucleus.   Phacoemulsification was then used in stop and chop fashion to remove the lens nucleus and epinucleus.  The remaining cortex was then removed using the irrigation and aspiration handpiece. Provisc was then placed into the capsular bag to distend it for lens placement.  A lens was then injected into the capsular bag. The Malyugin ring was removed. The remaining viscoelastic was aspirated.   Wounds were hydrated with balanced salt solution.  The anterior chamber was inflated to a physiologic pressure with balanced salt solution.  No wound leaks were noted. Vigamox 0.2 ml of a 1mg  per ml solution was injected into the anterior chamber for a dose of 0.2 mg of intracameral antibiotic at the completion of the case.  The patient was taken to the recovery room in stable condition without complications of anesthesia or surgery.

## 2021-01-15 DIAGNOSIS — N390 Urinary tract infection, site not specified: Secondary | ICD-10-CM | POA: Diagnosis not present

## 2021-02-26 DIAGNOSIS — G309 Alzheimer's disease, unspecified: Secondary | ICD-10-CM | POA: Diagnosis not present

## 2021-02-26 DIAGNOSIS — E559 Vitamin D deficiency, unspecified: Secondary | ICD-10-CM | POA: Diagnosis not present

## 2021-02-26 DIAGNOSIS — J449 Chronic obstructive pulmonary disease, unspecified: Secondary | ICD-10-CM | POA: Diagnosis not present

## 2021-02-26 DIAGNOSIS — K219 Gastro-esophageal reflux disease without esophagitis: Secondary | ICD-10-CM | POA: Diagnosis not present

## 2021-02-26 DIAGNOSIS — I10 Essential (primary) hypertension: Secondary | ICD-10-CM | POA: Diagnosis not present

## 2021-02-26 DIAGNOSIS — H109 Unspecified conjunctivitis: Secondary | ICD-10-CM | POA: Diagnosis not present

## 2021-02-26 DIAGNOSIS — I2510 Atherosclerotic heart disease of native coronary artery without angina pectoris: Secondary | ICD-10-CM | POA: Diagnosis not present

## 2021-02-26 DIAGNOSIS — G8929 Other chronic pain: Secondary | ICD-10-CM | POA: Diagnosis not present

## 2021-03-01 DIAGNOSIS — M79602 Pain in left arm: Secondary | ICD-10-CM | POA: Diagnosis not present

## 2021-03-01 DIAGNOSIS — M79632 Pain in left forearm: Secondary | ICD-10-CM | POA: Diagnosis not present

## 2021-03-01 DIAGNOSIS — M25512 Pain in left shoulder: Secondary | ICD-10-CM | POA: Diagnosis not present

## 2021-03-01 DIAGNOSIS — M25522 Pain in left elbow: Secondary | ICD-10-CM | POA: Diagnosis not present

## 2021-04-03 DEATH — deceased
